# Patient Record
Sex: Female | Born: 1950 | Race: White | Hispanic: No | Marital: Single | Smoking: Never smoker
Health system: Southern US, Community
[De-identification: ages and names within clinical notes are randomized; demographics above are authoritative.]

## PROBLEM LIST (undated history)

## (undated) DIAGNOSIS — Z8719 Personal history of other diseases of the digestive system: Secondary | ICD-10-CM

## (undated) DIAGNOSIS — F419 Anxiety disorder, unspecified: Secondary | ICD-10-CM

## (undated) DIAGNOSIS — I499 Cardiac arrhythmia, unspecified: Secondary | ICD-10-CM

## (undated) DIAGNOSIS — M1990 Unspecified osteoarthritis, unspecified site: Secondary | ICD-10-CM

## (undated) DIAGNOSIS — F32A Depression, unspecified: Secondary | ICD-10-CM

## (undated) DIAGNOSIS — M5090 Cervical disc disorder, unspecified, unspecified cervical region: Secondary | ICD-10-CM

## (undated) DIAGNOSIS — M797 Fibromyalgia: Secondary | ICD-10-CM

## (undated) DIAGNOSIS — F329 Major depressive disorder, single episode, unspecified: Secondary | ICD-10-CM

## (undated) DIAGNOSIS — B1920 Unspecified viral hepatitis C without hepatic coma: Secondary | ICD-10-CM

## (undated) DIAGNOSIS — J45909 Unspecified asthma, uncomplicated: Secondary | ICD-10-CM

## (undated) DIAGNOSIS — K219 Gastro-esophageal reflux disease without esophagitis: Secondary | ICD-10-CM

## (undated) DIAGNOSIS — C801 Malignant (primary) neoplasm, unspecified: Secondary | ICD-10-CM

## (undated) DIAGNOSIS — J449 Chronic obstructive pulmonary disease, unspecified: Secondary | ICD-10-CM

## (undated) DIAGNOSIS — I619 Nontraumatic intracerebral hemorrhage, unspecified: Secondary | ICD-10-CM

## (undated) HISTORY — DX: Anxiety disorder, unspecified: F41.9

## (undated) HISTORY — PX: BREAST REDUCTION SURGERY: SHX8

## (undated) HISTORY — DX: Depression, unspecified: F32.A

## (undated) HISTORY — DX: Unspecified viral hepatitis C without hepatic coma: B19.20

## (undated) HISTORY — PX: OTHER SURGICAL HISTORY: SHX169

## (undated) HISTORY — DX: Cervical disc disorder, unspecified, unspecified cervical region: M50.90

## (undated) HISTORY — DX: Major depressive disorder, single episode, unspecified: F32.9

---

## 2012-02-13 ENCOUNTER — Ambulatory Visit: Payer: Self-pay | Admitting: Anesthesiology

## 2012-02-13 ENCOUNTER — Ambulatory Visit: Payer: Self-pay | Admitting: Otolaryngology

## 2012-02-21 ENCOUNTER — Ambulatory Visit: Payer: Self-pay | Admitting: Otolaryngology

## 2015-08-01 DIAGNOSIS — I10 Essential (primary) hypertension: Secondary | ICD-10-CM | POA: Insufficient documentation

## 2015-08-01 DIAGNOSIS — R002 Palpitations: Secondary | ICD-10-CM | POA: Insufficient documentation

## 2015-08-01 DIAGNOSIS — E782 Mixed hyperlipidemia: Secondary | ICD-10-CM | POA: Insufficient documentation

## 2015-08-01 DIAGNOSIS — R079 Chest pain, unspecified: Secondary | ICD-10-CM | POA: Insufficient documentation

## 2016-01-16 HISTORY — PX: COLONOSCOPY: SHX174

## 2016-05-14 ENCOUNTER — Other Ambulatory Visit: Payer: Self-pay | Admitting: Family Medicine

## 2016-05-14 ENCOUNTER — Ambulatory Visit
Admission: RE | Admit: 2016-05-14 | Discharge: 2016-05-14 | Disposition: A | Discharge status: Home / Self Care | Payer: 59 | Source: Ambulatory Visit | Attending: Family Medicine | Admitting: Family Medicine

## 2016-05-14 DIAGNOSIS — R112 Nausea with vomiting, unspecified: Secondary | ICD-10-CM | POA: Insufficient documentation

## 2016-05-14 DIAGNOSIS — N131 Hydronephrosis with ureteral stricture, not elsewhere classified: Secondary | ICD-10-CM | POA: Diagnosis not present

## 2016-05-14 DIAGNOSIS — N261 Atrophy of kidney (terminal): Secondary | ICD-10-CM | POA: Diagnosis not present

## 2016-05-14 DIAGNOSIS — K769 Liver disease, unspecified: Secondary | ICD-10-CM | POA: Insufficient documentation

## 2016-05-14 DIAGNOSIS — R1900 Intra-abdominal and pelvic swelling, mass and lump, unspecified site: Secondary | ICD-10-CM | POA: Diagnosis not present

## 2016-05-14 DIAGNOSIS — R1031 Right lower quadrant pain: Secondary | ICD-10-CM

## 2016-05-17 ENCOUNTER — Telehealth: Payer: Self-pay

## 2016-05-17 NOTE — Telephone Encounter (Signed)
  Oncology Nurse Navigator Documentation  Navigator Location: [facility] (05/17/16 1500) Navigator Encounter Type: Introductory phone call;Telephone (05/17/16 1500) Telephone: Outgoing Call;Diagnostic Results;Education (05/17/16 1500)             Barriers/Navigation Needs: Coordination of Care (05/17/16 1500)   Interventions: Coordination of Care (05/17/16 1500)            Acuity: Level 2 (05/17/16 1500)   Acuity Level 2: Initial guidance, education and coordination as needed;Educational needs;Assistance expediting appointments;Ongoing guidance and education throughout treatment as needed (05/17/16 1500)     Time Spent with Patient: 60 (05/17/16 1500)   CT scan presented at case conference today for Dr. Clemmie Krill. Spoke with Dr Clemmie Krill following conference with tumor board recommendations as follows: -Urology referral for hydronephrosis due to mass encasing ureter. -PET scan to evaluate another biopsy site and to assess chest. -Medical Oncology referral  Per IR, the mass cannot be accessed for percutaneous biopsy due to blockage from bowel and vessels. Liver lesions are to small to biopsy. If the PET scan does not show another biopsy site then EUS or open biopsy would be next step. At request of Dr. Clemmie Krill called and spoke with Sandra Landry. All of the above explained and she is in agreeance to pursue the recommendations of tumor board. Dr Clemmie Krill will contact her as well and order PET/urology/med onc referrals. Provided Sandra Landry with my contact information for any future questions or needs.

## 2016-05-22 ENCOUNTER — Telehealth: Payer: Self-pay

## 2016-05-22 NOTE — Telephone Encounter (Signed)
  Oncology Nurse Navigator Documentation  Navigator Location: [facility] (05/22/16 1400) Navigator Encounter Type: Telephone (05/22/16 1400) Telephone: Lahoma Crocker Call;Appt Confirmation/Clarification (05/22/16 1400)                                        Time Spent with Patient: 15 (05/22/16 1400)   Voicemail left with Darliss Ridgel for med onc appt. September 8th 10:00 with Dr Mike Gip. Call back number left for questions.

## 2016-05-22 NOTE — Telephone Encounter (Signed)
  Oncology Nurse Navigator Documentation  Navigator Location: [facility] (05/22/16 1600) Navigator Encounter Type: Telephone (05/22/16 1600) Telephone: Incoming Call;Appt Confirmation/Clarification (05/22/16 1600)                                        Time Spent with Patient: 15 (05/22/16 1600)   Notified Ms Sandra Landry of appt 9-8 Friday at 10:00 with Dr. Mike Gip. Directions to cancer center given.

## 2016-05-22 NOTE — Telephone Encounter (Signed)
  Oncology Nurse Navigator Documentation  Navigator Location: [facility] (05/22/16 0900) Navigator Encounter Type: Telephone (05/22/16 0900)                                          Time Spent with Patient: 15 (05/22/16 0900)   Called and spoke with Dr Clemmie Krill. She had made Urology referral. PET requires peer to peer and is in process. Patient was reluctant to oncology referral but has since decided for referral. I will facilitate getting appt scheduled for med onc.

## 2016-05-23 ENCOUNTER — Other Ambulatory Visit: Payer: Self-pay | Admitting: Family Medicine

## 2016-05-23 DIAGNOSIS — R935 Abnormal findings on diagnostic imaging of other abdominal regions, including retroperitoneum: Secondary | ICD-10-CM

## 2016-05-25 ENCOUNTER — Inpatient Hospital Stay: Payer: 59 | Attending: Hematology and Oncology | Admitting: Hematology and Oncology

## 2016-05-25 ENCOUNTER — Encounter (INDEPENDENT_AMBULATORY_CARE_PROVIDER_SITE_OTHER): Payer: Self-pay

## 2016-05-25 ENCOUNTER — Other Ambulatory Visit: Payer: Self-pay | Admitting: *Deleted

## 2016-05-25 ENCOUNTER — Emergency Department: Payer: 59

## 2016-05-25 ENCOUNTER — Encounter: Payer: Self-pay | Admitting: Hematology and Oncology

## 2016-05-25 ENCOUNTER — Emergency Department
Admission: EM | Admit: 2016-05-25 | Discharge: 2016-05-25 | Disposition: A | Discharge status: Home / Self Care | Payer: 59 | Attending: Emergency Medicine | Admitting: Emergency Medicine

## 2016-05-25 ENCOUNTER — Encounter: Payer: Self-pay | Admitting: Emergency Medicine

## 2016-05-25 VITALS — BP 160/118 | HR 132 | Temp 96.2°F | Resp 18 | Ht 66.0 in | Wt 135.8 lb

## 2016-05-25 DIAGNOSIS — K5900 Constipation, unspecified: Secondary | ICD-10-CM | POA: Diagnosis not present

## 2016-05-25 DIAGNOSIS — R634 Abnormal weight loss: Secondary | ICD-10-CM | POA: Insufficient documentation

## 2016-05-25 DIAGNOSIS — R1110 Vomiting, unspecified: Secondary | ICD-10-CM

## 2016-05-25 DIAGNOSIS — M797 Fibromyalgia: Secondary | ICD-10-CM | POA: Diagnosis not present

## 2016-05-25 DIAGNOSIS — F419 Anxiety disorder, unspecified: Secondary | ICD-10-CM | POA: Diagnosis not present

## 2016-05-25 DIAGNOSIS — N131 Hydronephrosis with ureteral stricture, not elsewhere classified: Secondary | ICD-10-CM | POA: Diagnosis not present

## 2016-05-25 DIAGNOSIS — F329 Major depressive disorder, single episode, unspecified: Secondary | ICD-10-CM | POA: Diagnosis not present

## 2016-05-25 DIAGNOSIS — M5030 Other cervical disc degeneration, unspecified cervical region: Secondary | ICD-10-CM | POA: Diagnosis not present

## 2016-05-25 DIAGNOSIS — R0602 Shortness of breath: Secondary | ICD-10-CM | POA: Diagnosis not present

## 2016-05-25 DIAGNOSIS — Z8619 Personal history of other infectious and parasitic diseases: Secondary | ICD-10-CM | POA: Diagnosis not present

## 2016-05-25 DIAGNOSIS — R1900 Intra-abdominal and pelvic swelling, mass and lump, unspecified site: Secondary | ICD-10-CM | POA: Diagnosis present

## 2016-05-25 DIAGNOSIS — R112 Nausea with vomiting, unspecified: Secondary | ICD-10-CM | POA: Insufficient documentation

## 2016-05-25 DIAGNOSIS — R110 Nausea: Secondary | ICD-10-CM | POA: Diagnosis not present

## 2016-05-25 DIAGNOSIS — Z79899 Other long term (current) drug therapy: Secondary | ICD-10-CM | POA: Diagnosis not present

## 2016-05-25 DIAGNOSIS — R000 Tachycardia, unspecified: Secondary | ICD-10-CM | POA: Insufficient documentation

## 2016-05-25 DIAGNOSIS — K769 Liver disease, unspecified: Secondary | ICD-10-CM | POA: Diagnosis not present

## 2016-05-25 DIAGNOSIS — E860 Dehydration: Secondary | ICD-10-CM

## 2016-05-25 DIAGNOSIS — R1031 Right lower quadrant pain: Secondary | ICD-10-CM | POA: Diagnosis not present

## 2016-05-25 HISTORY — DX: Fibromyalgia: M79.7

## 2016-05-25 LAB — COMPREHENSIVE METABOLIC PANEL
ALBUMIN: 3.4 g/dL — AB (ref 3.5–5.0)
ALT: 9 U/L — ABNORMAL LOW (ref 14–54)
AST: 20 U/L (ref 15–41)
Alkaline Phosphatase: 71 U/L (ref 38–126)
Anion gap: 15 (ref 5–15)
BUN: 13 mg/dL (ref 6–20)
CHLORIDE: 100 mmol/L — AB (ref 101–111)
CO2: 18 mmol/L — AB (ref 22–32)
Calcium: 9.7 mg/dL (ref 8.9–10.3)
Creatinine, Ser: 1.01 mg/dL — ABNORMAL HIGH (ref 0.44–1.00)
GFR calc Af Amer: >60 mL/min (ref >60)
GFR, EST NON AFRICAN AMERICAN: 58 mL/min — AB (ref >60)
GLUCOSE: 118 mg/dL — AB (ref 65–99)
POTASSIUM: 3 mmol/L — AB (ref 3.5–5.1)
SODIUM: 133 mmol/L — AB (ref 135–145)
Total Bilirubin: 0.6 mg/dL (ref 0.3–1.2)
Total Protein: 7.4 g/dL (ref 6.5–8.1)

## 2016-05-25 LAB — CBC WITH DIFFERENTIAL/PLATELET
BASOS ABS: 0 10*3/uL (ref 0–0.1)
Basophils Relative: 0 %
Eosinophils Absolute: 0 10*3/uL (ref 0–0.7)
Eosinophils Relative: 0 %
HEMATOCRIT: 36 % (ref 35.0–47.0)
Hemoglobin: 12.1 g/dL (ref 12.0–16.0)
LYMPHS PCT: 10 %
Lymphs Abs: 1.6 10*3/uL (ref 1.0–3.6)
MCH: 26.6 pg (ref 26.0–34.0)
MCHC: 33.6 g/dL (ref 32.0–36.0)
MCV: 79.1 fL — AB (ref 80.0–100.0)
MONO ABS: 0.6 10*3/uL (ref 0.2–0.9)
Monocytes Relative: 4 %
NEUTROS ABS: 14 10*3/uL — AB (ref 1.4–6.5)
Neutrophils Relative %: 86 %
Platelets: 296 10*3/uL (ref 150–440)
RBC: 4.55 MIL/uL (ref 3.80–5.20)
RDW: 15 % — AB (ref 11.5–14.5)
WBC: 16.3 10*3/uL — ABNORMAL HIGH (ref 3.6–11.0)

## 2016-05-25 LAB — URINALYSIS COMPLETE WITH MICROSCOPIC (ARMC ONLY)
Bacteria, UA: NONE SEEN
Bilirubin Urine: NEGATIVE
Glucose, UA: NEGATIVE mg/dL
Leukocytes, UA: NEGATIVE
NITRITE: NEGATIVE
PH: 6 (ref 5.0–8.0)
PROTEIN: 30 mg/dL — AB
SPECIFIC GRAVITY, URINE: 1.013 (ref 1.005–1.030)

## 2016-05-25 LAB — MAGNESIUM: Magnesium: 1.9 mg/dL (ref 1.7–2.4)

## 2016-05-25 LAB — TSH: TSH: 1.374 u[IU]/mL (ref 0.350–4.500)

## 2016-05-25 LAB — LIPASE, BLOOD: LIPASE: 13 U/L (ref 11–51)

## 2016-05-25 MED ORDER — PROCHLORPERAZINE EDISYLATE 5 MG/ML IJ SOLN
10.0000 mg | Freq: Once | INTRAMUSCULAR | Status: AC
  Administered 2016-05-25: 10 mg via INTRAVENOUS
  Filled 2016-05-25: qty 2

## 2016-05-25 MED ORDER — POTASSIUM CHLORIDE CRYS ER 20 MEQ PO TBCR: 40.0000 meq | EXTENDED_RELEASE_TABLET | Freq: Once | ORAL | Status: DC

## 2016-05-25 MED ORDER — SODIUM CHLORIDE 0.9 % IV BOLUS (SEPSIS)
1000.0000 mL | Freq: Once | INTRAVENOUS | Status: AC
  Administered 2016-05-25: 1000 mL via INTRAVENOUS

## 2016-05-25 MED ORDER — IPRATROPIUM-ALBUTEROL 0.5-2.5 (3) MG/3ML IN SOLN: 3.0000 mL | Freq: Once | RESPIRATORY_TRACT | Status: DC

## 2016-05-25 NOTE — ED Triage Notes (Signed)
Developed n/v for the past 2-3 days  Was sent in from [facility] for possible dehydration

## 2016-05-25 NOTE — ED Notes (Signed)
Patient transported to X-ray 

## 2016-05-25 NOTE — Progress Notes (Signed)
[facility] day:  05/25/2016  Chief Complaint: Sandra Landry is a 65 y.o. female with a retroperitoneal mass who is referred in consultation by Dr. Clemmie Krill for assessment and management.  HPI: The patient is not been feeling well for a period of time.  She notes lower abdomen for 2 years which she has related to irritable bowel syndrome. For the past 2 months she is had RLQ pain which has extended to her right buttock and hip.  The patient presented to Dr. Lavera Guise on 05/14/2016 with severe acute abdominal pain.  Pain localized to the right lower quadrant concerning for appendicitis.  Abdominal and pelvic CT scan on 05/14/2016 revealed a 5.7 cm right lower quadrant retroperitoneal mass highly concerning for malignancy. The mass involves the sigmoid colon but is favored to reflect a primary retroperitoneal tumor (such as sarcoma) over a primary colonic mass.  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  There were multiple subcentimeter liver lesions, indeterminate.   Patient was presented at tumor board on 05/17/2016.  Recommendations were for urology referral for hydronephrosis due to the mass encasing the ureter.  PET scan was suggested to evaluate another site for biopsy.  Per interventional radiology, the mass cannot be accessed via percutaneous biopsy due to overlying bowel and intervening vasculature.  Liver lesions are too small to biopsy.  If the PET scan does not show another biopsy site then EUS or laparoscopic biopsy would be the next step.   She had a colonoscopy by Dr. Jacquiline Doe at [facility] on 07/18/2015.  She had 3 adenomatous polyps.  Symptomatically, she notes weight loss of 35 pounds in the past year.  She has been short of breath for 2 days.  She has had decreased urine output secondary to decreased oral intake.  She has "vomited a lot for years".  She is constipated and uses Miralax and  stool softeners.  Abdominal pain is not controlled.  She is scheduled for PET scan on 05/30/2016.  She has a urology appointment on 06/04/2016.   Past Medical History:  Diagnosis Date  . Anxiety   . Cervical disc disease   . Depression   . Fibromyalgia   . Hepatitis C     Past Surgical History:  Procedure Laterality Date  . cervical discectomy with fusion    . COLONOSCOPY  01/2016    No family history on file.  Social History:  reports that she has never smoked. She has never used smokeless tobacco. Her alcohol and drug histories are not on file.  She lives in [place].  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  She has also worked in the Physicist, medical.  The patient is accompanied by her partner, Darliss Ridgel, a nurse, today.  Allergies:  Allergies  Allergen Reactions  . Other Swelling    Dust, ragweed  . Iodides   . Tetanus Toxoids Other (See Comments)    Localized pain and swelling  . Iodine Other (See Comments) and Rash    Limited history. Occurred in 61s, at age 54-12, received contrast at public health facility in [place], while being evaluated for meningitis. Immediate rash - not itchy, "flat, red, perfect circles" over entire body. Has not received iodine contrast since.    Current Medications: Current Outpatient Prescriptions  Medication Sig Dispense Refill  . cetirizine (ZYRTEC) 10 MG tablet Take by mouth.    . ranitidine (ZANTAC) 300 MG  tablet Take 300 mg by mouth.    . baclofen (LIORESAL) 20 MG tablet     . diltiazem (CARDIZEM CD) 120 MG 24 hr capsule Take 120 mg by mouth.    . escitalopram (LEXAPRO) 20 MG tablet     . polyethylene glycol (MIRALAX / GLYCOLAX) packet     . promethazine (PHENERGAN) 25 MG suppository     . ranitidine (ZANTAC) 300 MG tablet     . traMADol (ULTRAM) 50 MG tablet Take by mouth.     No current facility-administered medications for this visit.     Review of Systems:  GENERAL:  Feels "bad".  No fevers or sweats.  Weight loss of  35 pounds in year. PERFORMANCE STATUS (ECOG):  2 HEENT:  No visual changes, runny nose, sore throat, mouth sores or tenderness. Lungs:  Shortness of breath x 2 days.  No cough.  No hemoptysis. Cardiac:  No chest pain, palpitations, orthopnea, or PND. GI:  Pain in RLQ which radiates to buttocks and hip.  No appetite.  Chronic nausea and vomiting.  Irritable bowel.  Constipation.  No diarrhea, melena or hematochezia. GU:  Decreased urine output.  No urgency, frequency, dysuria, or hematuria. Musculoskeletal:  No back pain.  No joint pain.  No muscle tenderness. Extremities:  No pain or swelling. Skin:  No rashes or skin changes. Neuro:  No headache, numbness or weakness, balance or coordination issues. Endocrine:  No diabetes, thyroid issues, hot flashes or night sweats. Psych:  Hypersensitive.  PTSD.  Depression.  Anxiety. Pain:  RLQ pain. Review of systems:  All other systems reviewed and found to be negative.  Physical Exam: Blood pressure (!) 160/118, pulse (!) 132, temperature (!) 96.2 F (35.7 C), resp. rate 18, height 5' 6"  (1.676 m), weight 135 lb 12.9 oz (61.6 kg). GENERAL:  Well developed, well nourished, woman lying on the exam room in mild/moderate distress. MENTAL STATUS:  Alert and oriented to person, place and time. HEAD:  Short dark hair with graying.  Normocephalic, atraumatic, face symmetric, no Cushingoid features. EYES:  Pupils equal round and reactive to light and accomodation.  No conjunctivitis or scleral icterus. ENT:  Oropharynx clear without lesion.  No upper teeth.  Tongue normal. Mucous membranes moist.  RESPIRATORY:  Clear to auscultation without rales, wheezes or rhonchi. CARDIOVASCULAR:  Regular rate and rhythm without murmur, rub or gallop. ABDOMEN:  Soft, tender RLQ without guarding or rebound tenderness.  Mass like fullness in RLQ.  Active bowel sounds and no hepatosplenomegaly.   SKIN:  No rashes, ulcers or lesions. EXTREMITIES: No edema, no skin  discoloration or tenderness.  No palpable cords. LYMPH NODES: No palpable cervical, supraclavicular, axillary or inguinal adenopathy  NEUROLOGICAL: Unremarkable. PSYCH:  Appropriate.   Admission on 05/25/2016, Discharged on 05/25/2016  Component Date Value Ref Range Status  . Sodium 05/25/2016 133* 135 - 145 mmol/L Final  . Potassium 05/25/2016 3.0* 3.5 - 5.1 mmol/L Final  . Chloride 05/25/2016 100* 101 - 111 mmol/L Final  . CO2 05/25/2016 18* 22 - 32 mmol/L Final  . Glucose, Bld 05/25/2016 118* 65 - 99 mg/dL Final  . BUN 05/25/2016 13  6 - 20 mg/dL Final  . Creatinine, Ser 05/25/2016 1.01* 0.44 - 1.00 mg/dL Final  . Calcium 05/25/2016 9.7  8.9 - 10.3 mg/dL Final  . Total Protein 05/25/2016 7.4  6.5 - 8.1 g/dL Final  . Albumin 05/25/2016 3.4* 3.5 - 5.0 g/dL Final  . AST 05/25/2016 20  15 - 41 U/L Final  .  ALT 05/25/2016 9* 14 - 54 U/L Final  . Alkaline Phosphatase 05/25/2016 71  38 - 126 U/L Final  . Total Bilirubin 05/25/2016 0.6  0.3 - 1.2 mg/dL Final  . GFR calc non Af Amer 05/25/2016 58* >60 mL/min Final  . GFR calc Af Amer 05/25/2016 >60  >60 mL/min Final   Comment: (NOTE) The eGFR has been calculated using the CKD EPI equation. This calculation has not been validated in all clinical situations. eGFR's persistently <60 mL/min signify possible Chronic Kidney Disease.   . Anion gap 05/25/2016 15  5 - 15 Final  . Lipase 05/25/2016 13  11 - 51 U/L Final  . WBC 05/25/2016 16.3* 3.6 - 11.0 K/uL Final  . RBC 05/25/2016 4.55  3.80 - 5.20 MIL/uL Final  . Hemoglobin 05/25/2016 12.1  12.0 - 16.0 g/dL Final  . HCT 05/25/2016 36.0  35.0 - 47.0 % Final  . MCV 05/25/2016 79.1* 80.0 - 100.0 fL Final  . MCH 05/25/2016 26.6  26.0 - 34.0 pg Final  . MCHC 05/25/2016 33.6  32.0 - 36.0 g/dL Final  . RDW 05/25/2016 15.0* 11.5 - 14.5 % Final  . Platelets 05/25/2016 296  150 - 440 K/uL Final  . Neutrophils Relative % 05/25/2016 86  % Final  . Neutro Abs 05/25/2016 14.0* 1.4 - 6.5 K/uL Final  .  Lymphocytes Relative 05/25/2016 10  % Final  . Lymphs Abs 05/25/2016 1.6  1.0 - 3.6 K/uL Final  . Monocytes Relative 05/25/2016 4  % Final  . Monocytes Absolute 05/25/2016 0.6  0.2 - 0.9 K/uL Final  . Eosinophils Relative 05/25/2016 0  % Final  . Eosinophils Absolute 05/25/2016 0.0  0 - 0.7 K/uL Final  . Basophils Relative 05/25/2016 0  % Final  . Basophils Absolute 05/25/2016 0.0  0 - 0.1 K/uL Final  . Magnesium 05/25/2016 1.9  1.7 - 2.4 mg/dL Final  . TSH 05/25/2016 1.374  0.350 - 4.500 uIU/mL Final  . Color, Urine 05/25/2016 YELLOW* YELLOW Final  . APPearance 05/25/2016 CLEAR* CLEAR Final  . Glucose, UA 05/25/2016 NEGATIVE  NEGATIVE mg/dL Final  . Bilirubin Urine 05/25/2016 NEGATIVE  NEGATIVE Final  . Ketones, ur 05/25/2016 2+* NEGATIVE mg/dL Final  . Specific Gravity, Urine 05/25/2016 1.013  1.005 - 1.030 Final  . Hgb urine dipstick 05/25/2016 1+* NEGATIVE Final  . pH 05/25/2016 6.0  5.0 - 8.0 Final  . Protein, ur 05/25/2016 30* NEGATIVE mg/dL Final  . Nitrite 05/25/2016 NEGATIVE  NEGATIVE Final  . Leukocytes, UA 05/25/2016 NEGATIVE  NEGATIVE Final  . RBC / HPF 05/25/2016 0-5  0 - 5 RBC/hpf Final  . WBC, UA 05/25/2016 0-5  0 - 5 WBC/hpf Final  . Bacteria, UA 05/25/2016 NONE SEEN  NONE SEEN Final  . Squamous Epithelial / LPF 05/25/2016 0-5* NONE SEEN Final  . Hyaline Casts, UA 05/25/2016 PRESENT   Final    Assessment:  Teretha Chalupa is a 65 y.o. 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  She has a history of lower abdomen for 2 years felt secondary to irritable bowel syndrome. For the past 2 months she is had RLQ pain which has extended to her right buttock and hip.  She has lost 35 pounds in the past year.  Abdominal and pelvic CT scan on 05/14/2016 revealed a 5.7 cm right lower quadrant retroperitoneal mass highly concerning for malignancy. The mass involves the sigmoid colon but is favored to reflect a primary retroperitoneal tumor  (  such as sarcoma) over a primary colonic mass.  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  There were multiple subcentimeter liver lesions, indeterminate.   Colonoscopy at [facility] on 07/18/2015 revealed 3 adenomatous polyps.  Symptomatically, has significant RLQ pain and decreased urine output secondary to decreased oral intake.  She is in mild/moderate distress.  Plan: 1.  Review imaging with patient.  Discuss differential diagnosis (sarcoma, GIST,  lymphoma, germ cell, desmoid).  Suspect sarcoma.  Discuss presentation at tumor board.  Discuss need for biopsy.  Unable to obtain biopsy via CT secondary to vasculature and bowels. Discuss plan for outpatient PET scan on 05/30/2016 to assess for other potential biopsy site.  If no other site of disease, discuss laparoscopic biopsy or endoscopic biopsy with ultrasound through the colon. 2.  Discuss current symptomatology with the poor oral intake, dehydration and inadequate pain control. Discuss referral to the ER.  Her partner feels that she needs to be admitted to the hospital.   3.  Transfer to the ER. 4.  PET scan on 05/30/2016.  5.  RTC in 1 week for review of PET scan and plan for biopsy.   Lequita Asal, MD  05/25/2016

## 2016-05-25 NOTE — Progress Notes (Signed)
Patient here as new evaluation regarding retroperitoneal mass.  Referred by Dr. Clemmie Krill.

## 2016-05-25 NOTE — ED Provider Notes (Addendum)
[facility] Emergency Department Provider Note  ____________________________________________   I have reviewed the triage vital signs and the nursing notes.   HISTORY  Chief Complaint Emesis    HPI Sandra Landry is a 65 y.o. female with a history of a unknown mass, that is in the retroperitoneal area. According to Dr. Susy Manor who called [place], she has a history of recurrent episodes of vomiting and a 30 pound weight loss this year presents today with vomiting for 2 days and the feeling of being dehydrated. She does not complain of abdominal pain. She is having bowel movements although they are "small". She is not had any hematemesis or melena or bright red blood per rectum. She was sent in here for IV hydration. Patient and family much for to go home if possible because there is a pending PET scan as an outpatient that would like to get to.    Past Medical History:  Diagnosis Date  . Anxiety   . Cervical disc disease   . Depression   . Fibromyalgia   . Hepatitis C     There are no active problems to display for this patient.   Past Surgical History:  Procedure Laterality Date  . cervical discectomy with fusion    . COLONOSCOPY  01/2016    Prior to Admission medications   Medication Sig Start Date End Date Taking? Authorizing Provider  baclofen (LIORESAL) 20 MG tablet  04/09/16   Historical Provider, MD  cetirizine (ZYRTEC) 10 MG tablet Take by mouth. 06/25/15   Historical Provider, MD  diltiazem (CARDIZEM CD) 120 MG 24 hr capsule Take 120 mg by mouth.    Historical Provider, MD  escitalopram (LEXAPRO) 20 MG tablet  04/26/16   Historical Provider, MD  polyethylene glycol (MIRALAX / GLYCOLAX) packet  05/07/16   Historical Provider, MD  promethazine (PHENERGAN) 25 MG suppository  02/20/16   Historical Provider, MD  ranitidine (ZANTAC) 300 MG tablet Take 300 mg by mouth. 03/08/15   Historical Provider, MD  ranitidine (ZANTAC) 300 MG tablet  05/17/16    Historical Provider, MD  traMADol (ULTRAM) 50 MG tablet Take by mouth.    Historical Provider, MD    Allergies Other; Iodides; Tetanus toxoids; and Iodine  No family history on file.  Social History Social History  Substance Use Topics  . Smoking status: Never Smoker  . Smokeless tobacco: Never Used  . Alcohol use Not on file    Review of Systems Constitutional: No fever/chills Eyes: No visual changes. ENT: No sore throat. No stiff neck no neck pain Cardiovascular: Denies chest pain. Respiratory: Denies shortness of breath. Gastrointestinal:   Positive vomiting.  No diarrhea.  No constipation. Genitourinary: Negative for dysuria. Musculoskeletal: Negative lower extremity swelling Skin: Negative for rash. Neurological: Negative for severe headaches, focal weakness or numbness. 10-point ROS otherwise negative.  ____________________________________________   PHYSICAL EXAM:  VITAL SIGNS: ED Triage Vitals  Enc Vitals Group     BP 05/25/16 1202 (!) 147/100     Pulse Rate 05/25/16 1202 (!) 130     Resp 05/25/16 1202 20     Temp 05/25/16 1202 98.1 F (36.7 C)     Temp Source 05/25/16 1202 Oral     SpO2 05/25/16 1202 98 %     Weight 05/25/16 1159 135 lb (61.2 kg)     Height 05/25/16 1159 5\' 6"  (1.676 m)     Head Circumference --      Peak Flow --  Pain Score --      Pain Loc --      Pain Edu? --      Excl. in [place]? --     Constitutional: Alert and oriented. Well appearing and in no acute distress. Eyes: Conjunctivae are normal. PERRL. EOMI. Head: Atraumatic. Nose: No congestion/rhinnorhea. Mouth/Throat: Mucous membranes are Dry.  Oropharynx non-erythematous. Neck: No stridor.   Nontender with no meningismus Cardiovascular: Tachycardia noted, regular rhythm. Grossly normal heart sounds.  Good peripheral circulation. Respiratory: Normal respiratory effort.  No retractions. Lungs CTAB. Abdominal: Soft and nontender. No distention. No guarding no rebound Back:   There is no focal tenderness or step off.  there is no midline tenderness there are no lesions noted. there is no CVA tenderness Musculoskeletal: No lower extremity tenderness, no upper extremity tenderness. No joint effusions, no DVT signs strong distal pulses no edema Neurologic:  Normal speech and language. No gross focal neurologic deficits are appreciated.  Skin:  Skin is warm, dry and intact. No rash noted. Psychiatric: Mood and affect are normal. Speech and behavior are normal.  ____________________________________________   LABS (all labs ordered are listed, but only abnormal results are displayed)  Labs Reviewed  COMPREHENSIVE METABOLIC PANEL  LIPASE, BLOOD  CBC WITH DIFFERENTIAL/PLATELET  MAGNESIUM  TSH   ____________________________________________  EKG  I personally interpreted any EKGs ordered by me or triage Sinus tachycardia rate 115 bpm no acute ST elevation or acute ST depression [place] LAD ____________________________________________  RADIOLOGY  I reviewed any imaging ordered by me or triage that were performed during my shift and, if possible, patient and/or family made aware of any abnormal findings. ____________________________________________   PROCEDURES  Procedure(s) performed: None  Procedures  Critical Care performed: None  ____________________________________________   INITIAL IMPRESSION / ASSESSMENT AND PLAN / ED COURSE  Pertinent labs & imaging results that were available during my care of the patient were reviewed by me and considered in my medical decision making (see chart for details).  She is nontoxic appearing, she is here for nausea and vomiting which is recurrent and likely secondary to her baseline and oncologic process. This is being worked up as an outpatient. She does not have an acute abdomen. He we'll give her IV fluid L Taylor insertion series as a precaution although I don't think CT is warranted at this moment as she looks,  we'll check basic blood work including electrolytes we will give her IV fluid and we will reassess her ability to go home as she desires to   ----------------------------------------- 2:34 PM on 05/25/2016 -----------------------------------------  Heart rate now down into the low 100s 102 last checked, patient feeling much better after 1 L giving a second liter abdomen remains benign workup is reassuring.  ----------------------------------------- 3:44 PM on 05/25/2016 -----------------------------------------  Her rate still goes up into the 110s, I have strongly advised the patient, as she refuses admission, receive another liter of IV fluid which she agrees to. Patient very much understands that at this time my advice to be admission for continued hydration and evaluation but she declines. She is adamant that she would like to go home. She does understand the risks benefits and alternatives of admission as well as discharge and she is well-appearing side from her tachycardia. Signed out to dr. Reita Cliche at the end of my shift. UA pending.  Clinical Course   ____________________________________________   FINAL CLINICAL IMPRESSION(S) / ED DIAGNOSES  Final diagnoses:  Vomiting      This chart was dictated using  voice recognition software.  Despite best efforts to proofread,  errors can occur which can change meaning.      Schuyler Amor, MD 05/25/16 [place], MD 05/25/16 1434    Schuyler Amor, MD 05/25/16 [place], MD 05/25/16 [place], MD 05/25/16 [phone]

## 2016-05-25 NOTE — ED Provider Notes (Signed)
Sandra Landry  I accepted care from Dr. Burlene Arnt ____________________________________________    LABS (pertinent positives/negatives)  Labs Reviewed  COMPREHENSIVE METABOLIC PANEL - Abnormal; Notable for the following:       Result Value   Sodium 133 (*)    Potassium 3.0 (*)    Chloride 100 (*)    CO2 18 (*)    Glucose, Bld 118 (*)    Creatinine, Ser 1.01 (*)    Albumin 3.4 (*)    ALT 9 (*)    GFR calc non Af Amer 58 (*)    All other components within normal limits  CBC WITH DIFFERENTIAL/PLATELET - Abnormal; Notable for the following:    WBC 16.3 (*)    MCV 79.1 (*)    RDW 15.0 (*)    Neutro Abs 14.0 (*)    All other components within normal limits  URINALYSIS COMPLETEWITH MICROSCOPIC ([facility] ONLY) - Abnormal; Notable for the following:    Color, Urine YELLOW (*)    APPearance CLEAR (*)    Ketones, ur 2+ (*)    Hgb urine dipstick 1+ (*)    Protein, ur 30 (*)    Squamous Epithelial / LPF 0-5 (*)    All other components within normal limits  LIPASE, BLOOD  MAGNESIUM  TSH     ____________________________________________   PROCEDURES  Procedure(s) performed: None  Critical Care performed: None  ____________________________________________   INITIAL IMPRESSION / ASSESSMENT AND PLAN / ED COURSE   Pertinent labs & imaging results that were available during my care of the patient were reviewed by me and considered in my medical decision making (see chart for details).  Sandra Landry would really like to go home.  We've discussed her elevated heart rate and that I think it would be best to be observed in the hospital.    She is currently receiving one more liter of fluid.  She did have a  Reassuring ct scan of the abdomen.  Urinalysis was without uti.  Will recheck with patient and partner after this liter of NS.  Heart rate down to 100. Patient feels better and states that she really wants to go home. It is okay for discharge. I did discuss with  her strict return precautions.  CONSULTATIONS: None    Patient / Family / Caregiver informed of clinical course, medical decision-making process, and agree with plan.   I discussed return precautions, follow-up instructions, and discharged instructions with patient and/or family.     ____________________________________________   FINAL CLINICAL IMPRESSION(S) / ED DIAGNOSES  Final diagnoses:  Vomiting  Non-intractable vomiting with nausea, vomiting of unspecified type  Dehydration        Sandra Roca, MD 05/25/16 [phone]

## 2016-05-25 NOTE — ED Notes (Signed)
Pt in via triage with complaints of N/V x 3 days, unable to keep anything down PO.  Pt was advised to come to [place] from the cancer center due to dehydration.  Pt denies any pain at this time.  Pt A/Ox4, tachycardic, other vitals WDL, no immediate distress noted at this time.

## 2016-05-25 NOTE — Discharge Instructions (Signed)
Return to the emergency department for any worsening condition including fever, chest or abdominal pain, vomiting with concern for dehydration, not making urine, dizziness or passing out, weakness or numbness, confusion or altered mental status or any other symptoms concerning to you.

## 2016-05-27 ENCOUNTER — Encounter: Payer: Self-pay | Admitting: Hematology and Oncology

## 2016-05-27 DIAGNOSIS — R634 Abnormal weight loss: Secondary | ICD-10-CM | POA: Insufficient documentation

## 2016-05-30 ENCOUNTER — Ambulatory Visit: Admission: RE | Admit: 2016-05-30 | Payer: 59 | Source: Ambulatory Visit

## 2016-05-30 ENCOUNTER — Observation Stay
Admission: EM | Admit: 2016-05-30 | Discharge: 2016-05-31 | Disposition: A | Discharge status: Home / Self Care | Payer: 59 | Attending: Internal Medicine | Admitting: Internal Medicine

## 2016-05-30 ENCOUNTER — Encounter: Payer: Self-pay | Admitting: Emergency Medicine

## 2016-05-30 DIAGNOSIS — B1920 Unspecified viral hepatitis C without hepatic coma: Secondary | ICD-10-CM | POA: Insufficient documentation

## 2016-05-30 DIAGNOSIS — Z6821 Body mass index (BMI) 21.0-21.9, adult: Secondary | ICD-10-CM | POA: Diagnosis not present

## 2016-05-30 DIAGNOSIS — Z9109 Other allergy status, other than to drugs and biological substances: Secondary | ICD-10-CM | POA: Diagnosis not present

## 2016-05-30 DIAGNOSIS — N179 Acute kidney failure, unspecified: Secondary | ICD-10-CM | POA: Insufficient documentation

## 2016-05-30 DIAGNOSIS — I959 Hypotension, unspecified: Secondary | ICD-10-CM | POA: Diagnosis not present

## 2016-05-30 DIAGNOSIS — E876 Hypokalemia: Secondary | ICD-10-CM | POA: Insufficient documentation

## 2016-05-30 DIAGNOSIS — I4581 Long QT syndrome: Secondary | ICD-10-CM | POA: Insufficient documentation

## 2016-05-30 DIAGNOSIS — R000 Tachycardia, unspecified: Secondary | ICD-10-CM | POA: Diagnosis not present

## 2016-05-30 DIAGNOSIS — R531 Weakness: Secondary | ICD-10-CM | POA: Diagnosis present

## 2016-05-30 DIAGNOSIS — Z887 Allergy status to serum and vaccine status: Secondary | ICD-10-CM | POA: Insufficient documentation

## 2016-05-30 DIAGNOSIS — F419 Anxiety disorder, unspecified: Secondary | ICD-10-CM | POA: Diagnosis not present

## 2016-05-30 DIAGNOSIS — R1110 Vomiting, unspecified: Secondary | ICD-10-CM | POA: Insufficient documentation

## 2016-05-30 DIAGNOSIS — R55 Syncope and collapse: Secondary | ICD-10-CM | POA: Diagnosis present

## 2016-05-30 DIAGNOSIS — Z888 Allergy status to other drugs, medicaments and biological substances status: Secondary | ICD-10-CM | POA: Insufficient documentation

## 2016-05-30 DIAGNOSIS — F329 Major depressive disorder, single episode, unspecified: Secondary | ICD-10-CM | POA: Insufficient documentation

## 2016-05-30 DIAGNOSIS — M797 Fibromyalgia: Secondary | ICD-10-CM | POA: Diagnosis not present

## 2016-05-30 DIAGNOSIS — R1900 Intra-abdominal and pelvic swelling, mass and lump, unspecified site: Secondary | ICD-10-CM | POA: Diagnosis not present

## 2016-05-30 DIAGNOSIS — D72829 Elevated white blood cell count, unspecified: Secondary | ICD-10-CM | POA: Diagnosis not present

## 2016-05-30 DIAGNOSIS — K5900 Constipation, unspecified: Secondary | ICD-10-CM | POA: Diagnosis not present

## 2016-05-30 DIAGNOSIS — Z981 Arthrodesis status: Secondary | ICD-10-CM | POA: Insufficient documentation

## 2016-05-30 DIAGNOSIS — Z91041 Radiographic dye allergy status: Secondary | ICD-10-CM | POA: Diagnosis not present

## 2016-05-30 DIAGNOSIS — Z79899 Other long term (current) drug therapy: Secondary | ICD-10-CM | POA: Diagnosis not present

## 2016-05-30 DIAGNOSIS — R634 Abnormal weight loss: Secondary | ICD-10-CM | POA: Insufficient documentation

## 2016-05-30 DIAGNOSIS — E860 Dehydration: Principal | ICD-10-CM | POA: Insufficient documentation

## 2016-05-30 LAB — COMPREHENSIVE METABOLIC PANEL
ALBUMIN: 3.4 g/dL — AB (ref 3.5–5.0)
ALT: 9 U/L — ABNORMAL LOW (ref 14–54)
AST: 20 U/L (ref 15–41)
Alkaline Phosphatase: 72 U/L (ref 38–126)
Anion gap: 13 (ref 5–15)
BUN: 15 mg/dL (ref 6–20)
CHLORIDE: 101 mmol/L (ref 101–111)
CO2: 20 mmol/L — ABNORMAL LOW (ref 22–32)
Calcium: 9.5 mg/dL (ref 8.9–10.3)
Creatinine, Ser: 1.42 mg/dL — ABNORMAL HIGH (ref 0.44–1.00)
GFR calc Af Amer: 45 mL/min — ABNORMAL LOW (ref >60)
GFR, EST NON AFRICAN AMERICAN: 38 mL/min — AB (ref >60)
Glucose, Bld: 207 mg/dL — ABNORMAL HIGH (ref 65–99)
POTASSIUM: 2.5 mmol/L — AB (ref 3.5–5.1)
Sodium: 134 mmol/L — ABNORMAL LOW (ref 135–145)
Total Bilirubin: 0.5 mg/dL (ref 0.3–1.2)
Total Protein: 7.1 g/dL (ref 6.5–8.1)

## 2016-05-30 LAB — MAGNESIUM: Magnesium: 2 mg/dL (ref 1.7–2.4)

## 2016-05-30 LAB — URINALYSIS COMPLETE WITH MICROSCOPIC (ARMC ONLY)
BILIRUBIN URINE: NEGATIVE
Bacteria, UA: NONE SEEN
Glucose, UA: NEGATIVE mg/dL
HGB URINE DIPSTICK: NEGATIVE
LEUKOCYTES UA: NEGATIVE
Nitrite: NEGATIVE
PH: 6 (ref 5.0–8.0)
Protein, ur: 30 mg/dL — AB
Specific Gravity, Urine: 1.013 (ref 1.005–1.030)

## 2016-05-30 LAB — POTASSIUM: Potassium: 3.5 mmol/L (ref 3.5–5.1)

## 2016-05-30 LAB — CBC
HCT: 35.8 % (ref 35.0–47.0)
Hemoglobin: 11.9 g/dL — ABNORMAL LOW (ref 12.0–16.0)
MCH: 26.3 pg (ref 26.0–34.0)
MCHC: 33.3 g/dL (ref 32.0–36.0)
MCV: 79 fL — AB (ref 80.0–100.0)
PLATELETS: 353 10*3/uL (ref 150–440)
RBC: 4.54 MIL/uL (ref 3.80–5.20)
RDW: 14.9 % — AB (ref 11.5–14.5)
WBC: 20.8 10*3/uL — AB (ref 3.6–11.0)

## 2016-05-30 LAB — TROPONIN I

## 2016-05-30 MED ORDER — ALBUTEROL SULFATE (2.5 MG/3ML) 0.083% IN NEBU: 2.5000 mg | INHALATION_SOLUTION | RESPIRATORY_TRACT | Status: DC | PRN

## 2016-05-30 MED ORDER — SODIUM CHLORIDE 0.9% FLUSH
3.0000 mL | Freq: Two times a day (BID) | INTRAVENOUS | Status: DC
  Administered 2016-05-30 – 2016-05-31 (×2): 3 mL via INTRAVENOUS

## 2016-05-30 MED ORDER — ONDANSETRON HCL 4 MG PO TABS
4.0000 mg | ORAL_TABLET | Freq: Four times a day (QID) | ORAL | Status: DC | PRN
Start: 2016-05-30 — End: 2016-05-31

## 2016-05-30 MED ORDER — POTASSIUM CHLORIDE IN NACL 20-0.9 MEQ/L-% IV SOLN
Freq: Once | INTRAVENOUS | Status: AC
  Administered 2016-05-30: 1000 mL via INTRAVENOUS
  Filled 2016-05-30: qty 1000

## 2016-05-30 MED ORDER — POTASSIUM CHLORIDE 20 MEQ/15ML (10%) PO SOLN
40.0000 meq | Freq: Once | ORAL | Status: DC
  Filled 2016-05-30: qty 30

## 2016-05-30 MED ORDER — ACETAMINOPHEN 325 MG PO TABS: 650.0000 mg | ORAL_TABLET | Freq: Four times a day (QID) | ORAL | Status: DC | PRN

## 2016-05-30 MED ORDER — HYDROCODONE-ACETAMINOPHEN 5-325 MG PO TABS
1.0000 | ORAL_TABLET | ORAL | Status: DC | PRN
  Administered 2016-05-30 (×2): 1 via ORAL
  Administered 2016-05-30 – 2016-05-31 (×2): 2 via ORAL
  Filled 2016-05-30: qty 1
  Filled 2016-05-30 (×2): qty 2
  Filled 2016-05-30: qty 1

## 2016-05-30 MED ORDER — POTASSIUM CHLORIDE 20 MEQ PO PACK
PACK | ORAL | Status: AC
  Filled 2016-05-30: qty 2

## 2016-05-30 MED ORDER — ONDANSETRON HCL 4 MG/2ML IJ SOLN: 4.0000 mg | Freq: Four times a day (QID) | INTRAMUSCULAR | Status: DC | PRN

## 2016-05-30 MED ORDER — SODIUM CHLORIDE 0.9 % IV BOLUS (SEPSIS)
1000.0000 mL | Freq: Once | INTRAVENOUS | Status: AC
  Administered 2016-05-30: 1000 mL via INTRAVENOUS

## 2016-05-30 MED ORDER — POTASSIUM CHLORIDE 10 MEQ/100ML IV SOLN
10.0000 meq | INTRAVENOUS | Status: AC
  Administered 2016-05-30 (×6): 10 meq via INTRAVENOUS
  Filled 2016-05-30 (×6): qty 100

## 2016-05-30 MED ORDER — POTASSIUM CHLORIDE IN NACL 20-0.9 MEQ/L-% IV SOLN
INTRAVENOUS | Status: DC
  Administered 2016-05-30: 1000 mL via INTRAVENOUS
  Administered 2016-05-31 (×2): via INTRAVENOUS
  Filled 2016-05-30 (×5): qty 1000

## 2016-05-30 MED ORDER — POTASSIUM CHLORIDE CRYS ER 20 MEQ PO TBCR
40.0000 meq | EXTENDED_RELEASE_TABLET | Freq: Once | ORAL | Status: DC
  Filled 2016-05-30: qty 2

## 2016-05-30 MED ORDER — POLYETHYLENE GLYCOL 3350 17 G PO PACK: 17.0000 g | PACK | Freq: Every day | ORAL | Status: DC | PRN

## 2016-05-30 MED ORDER — BISACODYL 10 MG RE SUPP: 10.0000 mg | Freq: Every day | RECTAL | Status: DC | PRN

## 2016-05-30 MED ORDER — ONDANSETRON HCL 4 MG/2ML IJ SOLN
4.0000 mg | Freq: Once | INTRAMUSCULAR | Status: AC
Start: 2016-05-30 — End: 2016-05-30
  Administered 2016-05-30: 4 mg via INTRAVENOUS
  Filled 2016-05-30: qty 2

## 2016-05-30 MED ORDER — SODIUM CHLORIDE 0.9 % IV SOLN: 250.0000 mL | INTRAVENOUS | Status: DC | PRN

## 2016-05-30 MED ORDER — SODIUM CHLORIDE 0.9% FLUSH: 3.0000 mL | INTRAVENOUS | Status: DC | PRN

## 2016-05-30 MED ORDER — LORATADINE 10 MG PO TABS
10.0000 mg | ORAL_TABLET | Freq: Every day | ORAL | Status: DC
  Administered 2016-05-30: 10 mg via ORAL
  Filled 2016-05-30: qty 1

## 2016-05-30 MED ORDER — ESCITALOPRAM OXALATE 10 MG PO TABS
20.0000 mg | ORAL_TABLET | Freq: Every day | ORAL | Status: DC
  Administered 2016-05-30: 17:00:00 20 mg via ORAL
  Filled 2016-05-30: qty 2

## 2016-05-30 MED ORDER — ACETAMINOPHEN 650 MG RE SUPP: 650.0000 mg | Freq: Four times a day (QID) | RECTAL | Status: DC | PRN

## 2016-05-30 MED ORDER — BACLOFEN 10 MG PO TABS
20.0000 mg | ORAL_TABLET | Freq: Four times a day (QID) | ORAL | Status: DC | PRN
Start: 2016-05-30 — End: 2016-05-31
  Administered 2016-05-30 – 2016-05-31 (×2): 20 mg via ORAL
  Filled 2016-05-30 (×2): qty 2

## 2016-05-30 MED ORDER — ENOXAPARIN SODIUM 40 MG/0.4ML ~~LOC~~ SOLN
40.0000 mg | SUBCUTANEOUS | Status: DC
  Administered 2016-05-30: 40 mg via SUBCUTANEOUS
  Filled 2016-05-30: qty 0.4

## 2016-05-30 NOTE — ED Provider Notes (Signed)
[facility] Emergency Department Provider Note  Time seen: 8:33 AM  I have reviewed the triage vital signs and the nursing notes.   HISTORY  Chief Complaint Weakness and Near Syncope    HPI Sandra Landry is a 65 y.o. female With a past medical history of anxiety, depression, fibromyalgia, hepatitis C, presents to the emergency department after syncope episode. According to the patient she was getting ready this morning to go to the hospital for her PET scan. States she did use the restroom stood up and walked approximately 10 feet before collapsing and losing consciousness. Does not believe she hit her head. Denies any pain. Patient has a history of sarcoma, states she has chronic nausea for which she receives IM Compazine at home, states she has a history of significant dehydration in the past. Has passed out in the past. Denies any chest pain today. Denies any focal weakness or numbness. Denies neck or back pain.  Past Medical History:  Diagnosis Date  . Anxiety   . Cervical disc disease   . Depression   . Fibromyalgia   . Hepatitis C     Patient Active Problem List   Diagnosis Date Noted  . Weight loss 05/27/2016  . 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 cervical discectomy with fusion    . COLONOSCOPY  01/2016    Prior to Admission medications   Medication Sig Start Date End Date Taking? Authorizing Provider  baclofen (LIORESAL) 20 MG tablet  04/09/16   Historical Provider, MD  cetirizine (ZYRTEC) 10 MG tablet Take by mouth. 06/25/15   Historical Provider, MD  diltiazem (CARDIZEM CD) 120 MG 24 hr capsule Take 120 mg by mouth.    Historical Provider, MD  escitalopram (LEXAPRO) 20 MG tablet  04/26/16   Historical Provider, MD  polyethylene glycol (MIRALAX / GLYCOLAX) packet  05/07/16   Historical Provider, MD  promethazine (PHENERGAN) 25 MG suppository  02/20/16   Historical Provider, MD  ranitidine  (ZANTAC) 300 MG tablet Take 300 mg by mouth. 03/08/15   Historical Provider, MD  ranitidine (ZANTAC) 300 MG tablet  05/17/16   Historical Provider, MD  traMADol (ULTRAM) 50 MG tablet Take by mouth.    Historical Provider, MD    Allergies  Allergen Reactions  . Other Swelling    Dust, ragweed  . Iodides   . Tetanus Toxoids Other (See Comments)    Localized pain and swelling  . Iodine Other (See Comments) and Rash    Limited history. Occurred in 71s, at age 41-12, received contrast at public health facility in [place], while being evaluated for meningitis. Immediate rash - not itchy, "flat, red, perfect circles" over entire body. Has not received iodine contrast since.    No family history on file.  Social History Social History  Substance Use Topics  . Smoking status: Never Smoker  . Smokeless tobacco: Never Used  . Alcohol use Not on file    Review of Systems Constitutional: Negative for fever. Cardiovascular: Negative for chest pain. Respiratory: Negative for shortness of breath. Gastrointestinal: Negative for abdominal pain Musculoskeletal: Negative for back pain.negative for neck pain Neurological: Negative for headaches, focal weakness or numbness. 10-point ROS otherwise negative.  ____________________________________________   PHYSICAL EXAM:  VITAL SIGNS: ED Triage Vitals  Enc Vitals Group     BP 05/30/16 0827 (!) 76/48     Pulse Rate 05/30/16 0827 (!) 124     Resp 05/30/16 0827  15     Temp 05/30/16 0827 97 F (36.1 C)     Temp Source 05/30/16 0827 Oral     SpO2 05/30/16 0827 97 %     Weight 05/30/16 0825 132 lb (59.9 kg)     Height 05/30/16 0825 5\' 6"  (1.676 m)     Head Circumference --      Peak Flow --      Pain Score 05/30/16 0825 3     Pain Loc --      Pain Edu? --      Excl. in [place]? --     Constitutional: Alert and oriented. Well appearing and in no distress. Eyes: Normal exam ENT   Head: Normocephalic and atraumatic.   Mouth/Throat:  Mucous membranes are moist. Cardiovascular: Normal rate, regular rhythm. No murmur Respiratory: Normal respiratory effort without tachypnea nor retractions. Breath sounds are clear  Gastrointestinal: Soft and nontender. No distention.   Musculoskeletal: Nontender with normal range of motion in all extremities. No lower extremity tenderness or edema.no neck or back tenderness. Neurologic:  Normal speech and language. No gross focal neurologic deficits are appreciated. Skin:  Skin is warm, dry and intact.  Psychiatric: Mood and affect are normal.   ____________________________________________    EKG  EKG and interpreted by myself shows sinus tachycardia 124 bpm, narrow QRS, normal axis, normal intervals besides slight QTC prolongation at 509 ms, nonspecific but no concerning ST changes.  ____________________________________________   INITIAL IMPRESSION / ASSESSMENT AND PLAN / ED COURSE  Pertinent labs & imaging results that were available during my care of the patient were reviewed by me and considered in my medical decision making (see chart for details).  The patient presents to the emergency Department after syncopal episode at home. Patient currently tachycardic with a low blood pressure. Patient states she was using the bathroom stood up walking 10 feet before passing out. Consistent with likely orthostatic syncope. We will check labs, IV hydrate, cluster monitor the patient's blood pressure. Currently patient has no complaints, states she feels much better.  Patient continues with significant nausea, hypokalemic receiving IV and by mouth repletion. Patient continues to look very dehydrated and weak, does not believe she'll be able to tolerate fluids at home given her significant nausea. We'll admit to the hospital for generalized weakness and hypokalemia.  ____________________________________________   FINAL CLINICAL IMPRESSION(S) / ED DIAGNOSES  Syncope Hypotension Gen.  Weakness Hypokalemia   Harvest Dark, MD 05/30/16 [phone]

## 2016-05-30 NOTE — H&P (Signed)
[facility] NAME: Sandra Landry    MR#:  [id number]  DATE OF BIRTH:  01-19-1951  DATE OF ADMISSION:  05/30/2016  PRIMARY CARE PHYSICIAN: Landry, Sandra Jude, MD   REQUESTING/REFERRING PHYSICIAN: Dr. Kerman Landry  CHIEF COMPLAINT:   Chief Complaint  Patient presents with  . Weakness  . Near Syncope    HISTORY OF PRESENT ILLNESS:  Sandra Landry  is a 65 y.o. female with a known history of Fibromyalgia, hepatitis C, retroperitoneal mass resins to the emergency room with an episode of syncope at home today morning. Patient has had chronic vomiting with frequent episodes of dehydration and lightheadedness. Was recently seen in the emergency room for the same. Today she was getting very to go for her PET scan, stood up and walked 10 feet and passed out. No seizures, no incontinence. No chest pain. Here in the emergency room patient was found to be hypotensive, tachycardic and hypokalemic.  She is following with Dr. 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  PAST MEDICAL HISTORY:   Past Medical History:  Diagnosis Date  . Anxiety   . Cervical disc disease   . Depression   . Fibromyalgia   . Hepatitis C     PAST SURGICAL HISTORY:   Past Surgical History:  Procedure Laterality Date  . cervical discectomy with fusion    . COLONOSCOPY  01/2016    SOCIAL HISTORY:   Social History  Substance Use Topics  . Smoking status: Never Smoker  . Smokeless tobacco: Never Used  . Alcohol use Not on file    FAMILY HISTORY:  No family history on file. NO CAD/CVA DRUG ALLERGIES:   Allergies  Allergen Reactions  . Other Swelling    Dust, ragweed  . Iodides   . Tetanus Toxoids Other (See Comments)    Localized pain and swelling  . Iodine Other (See Comments) and Rash    Limited history. Occurred in 4s, at age 25-12, received contrast at public health  facility in [place], while being evaluated for meningitis. Immediate rash - not itchy, "flat, red, perfect circles" over entire body. Has not received iodine contrast since.    REVIEW OF SYSTEMS:   ROS  MEDICATIONS AT HOME:   Prior to Admission medications   Medication Sig Start Date End Date Taking? Authorizing Provider  baclofen (LIORESAL) 20 MG tablet  04/09/16   Historical Provider, MD  cetirizine (ZYRTEC) 10 MG tablet Take by mouth. 06/25/15   Historical Provider, MD  diltiazem (CARDIZEM CD) 120 MG 24 hr capsule Take 120 mg by mouth.    Historical Provider, MD  escitalopram (LEXAPRO) 20 MG tablet  04/26/16   Historical Provider, MD  polyethylene glycol (MIRALAX / GLYCOLAX) packet  05/07/16   Historical Provider, MD  promethazine (PHENERGAN) 25 MG suppository  02/20/16   Historical Provider, MD  ranitidine (ZANTAC) 300 MG tablet Take 300 mg by mouth. 03/08/15   Historical Provider, MD  ranitidine (ZANTAC) 300 MG tablet  05/17/16   Historical Provider, MD  traMADol (ULTRAM) 50 MG tablet Take by mouth.    Historical Provider, MD     VITAL SIGNS:  Blood pressure 125/73, pulse 89, temperature 97 F (36.1 C), temperature source Oral, resp. rate 17, height 5\' 6"  (1.676 m), weight 59.9 kg (132 lb), SpO2 96 %.  PHYSICAL EXAMINATION:  Physical Exam  GENERAL:  65 y.o.-year-old patient lying in the bed  with no acute distress.  EYES: Pupils equal, round, reactive to light and accommodation. No scleral icterus. Extraocular muscles intact.  HEENT: Head atraumatic, normocephalic. Oropharynx and nasopharynx clear. No oropharyngeal erythema, moist oral mucosa  NECK:  Supple, no jugular venous distention. No thyroid enlargement, no tenderness.  LUNGS: Normal breath sounds bilaterally, no wheezing, rales, rhonchi. No use of accessory muscles of respiration.  CARDIOVASCULAR: S1, S2 normal. No murmurs, rubs, or gallops.  ABDOMEN: Soft, nontender, nondistended. Bowel sounds present. No organomegaly or  mass.  EXTREMITIES: No pedal edema, cyanosis, or clubbing. + 2 pedal & radial pulses b/l.   NEUROLOGIC: Cranial nerves II through XII are intact. No focal Motor or sensory deficits appreciated b/l PSYCHIATRIC: The patient is alert and oriented x 3. Good affect.  SKIN: No obvious rash, lesion, or ulcer.   LABORATORY PANEL:   CBC  Recent Labs Lab 05/30/16 0844  WBC 20.8*  HGB 11.9*  HCT 35.8  PLT 353   ------------------------------------------------------------------------------------------------------------------  Chemistries   Recent Labs Lab 05/30/16 0844  NA 134*  K 2.5*  CL 101  CO2 20*  GLUCOSE 207*  BUN 15  CREATININE 1.42*  CALCIUM 9.5  MG 2.0  AST 20  ALT 9*  ALKPHOS 72  BILITOT 0.5   ------------------------------------------------------------------------------------------------------------------  Cardiac Enzymes  Recent Labs Lab 05/30/16 0844  TROPONINI <0.03   ------------------------------------------------------------------------------------------------------------------  RADIOLOGY:  No results found.   IMPRESSION AND PLAN:   * Orthostatic syncope due to dehydration from vomiting Replace with IV fluids. Monitor on telemetry  * Acute injury due to dehydration Monitor input and output. Repeat labs in the morning after hydration.  * Hypokalemia Due to decreased intake and vomiting. Patient unable to take by mouth pills. We'll replace through IV. Repeat labs later at night. Add potassium to IV fluids. Check magnesium levels  * Dehydration due to chronic vomiting She uses IM Compazine at home. She is being worked up for retro-peritoneal mass causing right ureteral obstruction with chronic hydronephrosis.  * Leukocytosis Her white blood cell count was elevated during the last visit. Today is likely worse due to hemoconcentration. No source of infection. Will not start any antibiotics. Repeat labs in the morning  * DVT prophylaxis  with Lovenox  Discharge tomorrow if dehydration is resolved and hypokalemia is corrected.  All the records are reviewed and case discussed with ED provider. Management plans discussed with the patient, family and they are in agreement.  CODE STATUS: FULL CODE  TOTAL TIME TAKING CARE OF THIS PATIENT: 40 minutes.   Hillary Bow R M.D on 05/30/2016 at 3:31 PM  Between 7am to 6pm - Pager - [phone]  After 6pm go to [email or web address] - password [email or web address] [facility] [facility]  Office  [phone]  CC: Primary care physician; Sandra Jude, MD  Note: This dictation was prepared with Dragon dictation along with smaller phrase technology. Any transcriptional errors that result from this process are unintentional.

## 2016-05-30 NOTE — ED Notes (Signed)
Pt and significant other wish to complete advance directive paper work.  Chaplain notified and to bedside at this time to assist in doing so.  1C RN aware as well.

## 2016-05-30 NOTE — ED Triage Notes (Signed)
Brought in via family from home  States she had 2 possible syncopal episodes this am

## 2016-05-31 ENCOUNTER — Inpatient Hospital Stay: Payer: 59 | Admitting: Hematology and Oncology

## 2016-05-31 DIAGNOSIS — E860 Dehydration: Secondary | ICD-10-CM | POA: Diagnosis not present

## 2016-05-31 LAB — CBC
HCT: 27.5 % — ABNORMAL LOW (ref 35.0–47.0)
HEMOGLOBIN: 8.8 g/dL — AB (ref 12.0–16.0)
MCH: 25.9 pg — ABNORMAL LOW (ref 26.0–34.0)
MCHC: 31.9 g/dL — ABNORMAL LOW (ref 32.0–36.0)
MCV: 81.3 fL (ref 80.0–100.0)
Platelets: 244 10*3/uL (ref 150–440)
RBC: 3.38 MIL/uL — AB (ref 3.80–5.20)
RDW: 15 % — ABNORMAL HIGH (ref 11.5–14.5)
WBC: 16.3 10*3/uL — ABNORMAL HIGH (ref 3.6–11.0)

## 2016-05-31 LAB — BASIC METABOLIC PANEL
ANION GAP: 5 (ref 5–15)
BUN: 9 mg/dL (ref 6–20)
CALCIUM: 8 mg/dL — AB (ref 8.9–10.3)
CO2: 19 mmol/L — AB (ref 22–32)
Chloride: 113 mmol/L — ABNORMAL HIGH (ref 101–111)
Creatinine, Ser: 0.87 mg/dL (ref 0.44–1.00)
Glucose, Bld: 95 mg/dL (ref 65–99)
Potassium: 3.6 mmol/L (ref 3.5–5.1)
Sodium: 137 mmol/L (ref 135–145)

## 2016-05-31 NOTE — Progress Notes (Signed)
Discharge paperwork reviewed with patient who verbalized understanding. Patient's spouse to transport home.

## 2016-05-31 NOTE — Progress Notes (Deleted)
[facility] day:  05/31/2016   Chief Complaint: Sandra Landry is a 65 y.o. female with a retroperitoneal mass who is seen for review of interval PET scan and discussion regarding direction of therapy.  HPI: The patient was last seen in the medical oncology clinic on 05/25/2016 for initial consultation.  Abdominal and pelvic CT scan had revealed a 5.7 cm right lower quadrant retroperitoneal mass.  Mass was not amendable to CT guided biopsy.  Decision was made to pursue PET scan.  At last visit, she had significant RLQ pain and decreased urine output secondary to decreased oral intake.  She was in mild/moderate distress.  She was transported to the ER.  She recieved IVF.  She declined admission.  She was seen in the ER on 05/30/2016 with generalized weakness, dehydration, and syncope.  She was admitted to the hospital for IVF and electrolyte replacement.  She missed her PET scan.   Past Medical History:  Diagnosis Date  . Anxiety   . Cervical disc disease   . Depression   . Fibromyalgia   . Hepatitis C     Past Surgical History:  Procedure Laterality Date  . cervical discectomy with fusion    . COLONOSCOPY  01/2016    No family history on file.  Social History:  reports that she has never smoked. She has never used smokeless tobacco. Her alcohol and drug histories are not on file.  She lives in [place].  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  She has also worked in the Physicist, medical.  The patient is accompanied by her partner, Sandra Landry, a nurse, today.  Allergies:  Allergies  Allergen Reactions  . Other Swelling    Dust, ragweed  . Iodides   . Tetanus Toxoids Other (See Comments)    Localized pain and swelling  . Iodine Other (See Comments) and Rash    Limited history. Occurred in 23s, at age 70-12, received contrast at public health facility in [place], while being evaluated for meningitis. Immediate rash - not itchy,  "flat, red, perfect circles" over entire body. Has not received iodine contrast since.    Current Medications: No current facility-administered medications for this visit.    No current outpatient prescriptions on file.   Facility-Administered Medications Ordered in Other Visits  Medication Dose Route Frequency Provider Last Rate Last Dose  . 0.9 %  sodium chloride infusion  250 mL Intravenous PRN Sandra Sudini, MD      . 0.9 % NaCl with KCl 20 mEq/ L  infusion   Intravenous Continuous Sandra Sudini, MD      . acetaminophen (TYLENOL) tablet 650 mg  650 mg Oral Q6H PRN Sandra Bow, MD       Or  . acetaminophen (TYLENOL) suppository 650 mg  650 mg Rectal Q6H PRN Sandra Sudini, MD      . albuterol (PROVENTIL) (2.5 MG/3ML) 0.083% nebulizer solution 2.5 mg  2.5 mg Nebulization Q2H PRN Sandra Sudini, MD      . baclofen (LIORESAL) tablet 20 mg  20 mg Oral QID PRN Sandra Bow, MD   20 mg at 05/30/16 2103  . bisacodyl (DULCOLAX) suppository 10 mg  10 mg Rectal Daily PRN Sandra Sudini, MD      . enoxaparin (LOVENOX) injection 40 mg  40 mg Subcutaneous Q24H Sandra Bow, MD   40 mg at 05/30/16 1720  . escitalopram (LEXAPRO) tablet 20 mg  20 mg Oral Daily Sandra  Sudini, MD   20 mg at 05/30/16 1720  . HYDROcodone-acetaminophen (NORCO/VICODIN) 5-325 MG per tablet 1-2 tablet  1-2 tablet Oral Q4H PRN Sandra Bow, MD   2 tablet at 05/30/16 2302  . loratadine (CLARITIN) tablet 10 mg  10 mg Oral Daily Sandra Bow, MD   10 mg at 05/30/16 1720  . ondansetron (ZOFRAN) tablet 4 mg  4 mg Oral Q6H PRN Sandra Bow, MD       Or  . ondansetron (ZOFRAN) injection 4 mg  4 mg Intravenous Q6H PRN Sandra Sudini, MD      . polyethylene glycol (MIRALAX / GLYCOLAX) packet 17 g  17 g Oral Daily PRN Sandra Sudini, MD      . sodium chloride flush (NS) 0.9 % injection 3 mL  3 mL Intravenous Q12H Sandra Sudini, MD   3 mL at 05/30/16 1651  . sodium chloride flush (NS) 0.9 % injection 3 mL  3 mL Intravenous Q12H Sandra  Sudini, MD   3 mL at 05/30/16 1651  . sodium chloride flush (NS) 0.9 % injection 3 mL  3 mL Intravenous PRN Sandra Bow, MD        Review of Systems:  GENERAL:  Feels "bad".  No fevers or sweats.  Weight loss of 35 pounds in year. PERFORMANCE STATUS (ECOG):  2 HEENT:  No visual changes, runny nose, sore throat, mouth sores or tenderness. Lungs:  Shortness of breath x 2 days.  No cough.  No hemoptysis. Cardiac:  No chest pain, palpitations, orthopnea, or PND. GI:  Pain in RLQ which radiates to buttocks and hip.  No appetite.  Chronic nausea and vomiting.  Irritable bowel.  Constipation.  No diarrhea, melena or hematochezia. GU:  Decreased urine output.  No urgency, frequency, dysuria, or hematuria. Musculoskeletal:  No back pain.  No joint pain.  No muscle tenderness. Extremities:  No pain or swelling. Skin:  No rashes or skin changes. Neuro:  No headache, numbness or weakness, balance or coordination issues. Endocrine:  No diabetes, thyroid issues, hot flashes or night sweats. Psych:  Hypersensitive.  PTSD.  Depression.  Anxiety. Pain:  RLQ pain. Review of systems:  All other systems reviewed and found to be negative.  Physical Exam: There were no vitals taken for this visit. GENERAL:  Well developed, well nourished, woman lying on the exam room in mild/moderate distress. MENTAL STATUS:  Alert and oriented to person, place and time. HEAD:  Short dark hair with graying.  Normocephalic, atraumatic, face symmetric, no Cushingoid features. EYES:  Pupils equal round and reactive to light and accomodation.  No conjunctivitis or scleral icterus. ENT:  Oropharynx clear without lesion.  No upper teeth.  Tongue normal. Mucous membranes moist.  RESPIRATORY:  Clear to auscultation without rales, wheezes or rhonchi. CARDIOVASCULAR:  Regular rate and rhythm without murmur, rub or gallop. ABDOMEN:  Soft, tender RLQ without guarding or rebound tenderness.  Mass like fullness in RLQ.  Active bowel  sounds and no hepatosplenomegaly.   SKIN:  No rashes, ulcers or lesions. EXTREMITIES: No edema, no skin discoloration or tenderness.  No palpable cords. LYMPH NODES: No palpable cervical, supraclavicular, axillary or inguinal adenopathy  NEUROLOGICAL: Unremarkable. PSYCH:  Appropriate.   Admission on 05/30/2016  Component Date Value Ref Range Status  . WBC 05/30/2016 20.8* 3.6 - 11.0 K/uL Final  . RBC 05/30/2016 4.54  3.80 - 5.20 MIL/uL Final  . Hemoglobin 05/30/2016 11.9* 12.0 - 16.0 g/dL Final  . HCT 05/30/2016 35.8  35.0 - 47.0 %  Final  . MCV 05/30/2016 79.0* 80.0 - 100.0 fL Final  . MCH 05/30/2016 26.3  26.0 - 34.0 pg Final  . MCHC 05/30/2016 33.3  32.0 - 36.0 g/dL Final  . RDW 05/30/2016 14.9* 11.5 - 14.5 % Final  . Platelets 05/30/2016 353  150 - 440 K/uL Final  . Sodium 05/30/2016 134* 135 - 145 mmol/L Final  . Potassium 05/30/2016 2.5* 3.5 - 5.1 mmol/L Final   Comment: CRITICAL RESULT CALLED TO, READ BACK BY AND VERIFIED WITH Sandra Landry AT 0919 ON 05/30/16.Marland KitchenMarland KitchenLock Haven   . Chloride 05/30/2016 101  101 - 111 mmol/L Final  . CO2 05/30/2016 20* 22 - 32 mmol/L Final  . Glucose, Bld 05/30/2016 207* 65 - 99 mg/dL Final  . BUN 05/30/2016 15  6 - 20 mg/dL Final  . Creatinine, Ser 05/30/2016 1.42* 0.44 - 1.00 mg/dL Final  . Calcium 05/30/2016 9.5  8.9 - 10.3 mg/dL Final  . Total Protein 05/30/2016 7.1  6.5 - 8.1 g/dL Final  . Albumin 05/30/2016 3.4* 3.5 - 5.0 g/dL Final  . AST 05/30/2016 20  15 - 41 U/L Final  . ALT 05/30/2016 9* 14 - 54 U/L Final  . Alkaline Phosphatase 05/30/2016 72  38 - 126 U/L Final  . Total Bilirubin 05/30/2016 0.5  0.3 - 1.2 mg/dL Final  . GFR calc non Af Amer 05/30/2016 38* >60 mL/min Final  . GFR calc Af Amer 05/30/2016 45* >60 mL/min Final   Comment: (NOTE) The eGFR has been calculated using the CKD EPI equation. This calculation has not been validated in all clinical situations. eGFR's persistently <60 mL/min signify possible Chronic Kidney Disease.   .  Anion gap 05/30/2016 13  5 - 15 Final  . Troponin I 05/30/2016 <0.03  <0.03 ng/mL Final  . Color, Urine 05/30/2016 YELLOW* YELLOW Final  . APPearance 05/30/2016 CLEAR* CLEAR Final  . Glucose, UA 05/30/2016 NEGATIVE  NEGATIVE mg/dL Final  . Bilirubin Urine 05/30/2016 NEGATIVE  NEGATIVE Final  . Ketones, ur 05/30/2016 TRACE* NEGATIVE mg/dL Final  . Specific Gravity, Urine 05/30/2016 1.013  1.005 - 1.030 Final  . Hgb urine dipstick 05/30/2016 NEGATIVE  NEGATIVE Final  . pH 05/30/2016 6.0  5.0 - 8.0 Final  . Protein, ur 05/30/2016 30* NEGATIVE mg/dL Final  . Nitrite 05/30/2016 NEGATIVE  NEGATIVE Final  . Leukocytes, UA 05/30/2016 NEGATIVE  NEGATIVE Final  . RBC / HPF 05/30/2016 0-5  0 - 5 RBC/hpf Final  . WBC, UA 05/30/2016 0-5  0 - 5 WBC/hpf Final  . Bacteria, UA 05/30/2016 NONE SEEN  NONE SEEN Final  . Squamous Epithelial / LPF 05/30/2016 0-5* NONE SEEN Final  . Mucous 05/30/2016 PRESENT   Final  . Magnesium 05/30/2016 2.0  1.7 - 2.4 mg/dL Final  . Potassium 05/30/2016 3.5  3.5 - 5.1 mmol/L Final    Assessment:  Jobeth Pangilinan is a 65 y.o. 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  She has a history of lower abdomen for 2 years felt secondary to irritable bowel syndrome. For the past 2 months she is had RLQ pain which has extended to her right buttock and hip.  She has lost 35 pounds in the past year.  Abdominal and pelvic CT scan on 05/14/2016 revealed a 5.7 cm right lower quadrant retroperitoneal mass highly concerning for malignancy. The mass involves the sigmoid colon but is favored to reflect a primary retroperitoneal tumor (such as sarcoma) over a primary colonic mass.  There was  chronic right ureteral obstruction by the mass with hydronephrosis and renal atrophy.  There were multiple subcentimeter liver lesions, indeterminate.   Colonoscopy at [facility] on 07/18/2015 revealed 3 adenomatous polyps.  Symptomatically, has significant RLQ pain  and decreased urine output secondary to decreased oral intake.  She is in mild/moderate distress.  Plan: 1.  Review imaging with patient.  Discuss differential diagnosis (sarcoma, GIST,  lymphoma, germ cell, desmoid).  Suspect sarcoma.  Discuss presentation at tumor board.  Discuss need for biopsy.  Unable to obtain biopsy via CT secondary to vasculature and bowels. Discuss plan for outpatient PET scan on 05/30/2016 to assess for other potential biopsy site.  If no other site of disease, discuss laparoscopic biopsy or endoscopic biopsy with ultrasound through the colon. 2.  Discuss current symptomatology with the poor oral intake, dehydration and inadequate pain control. Discuss referral to the ER.  Her partner feels that she needs to be admitted to the hospital.   3.  Transfer to the ER. 4.  PET scan on 05/30/2016.  5.  RTC in 1 week for review of PET scan and plan for biopsy.   Sandra Asal, MD  05/31/2016, 4:13 AM

## 2016-05-31 NOTE — Discharge Summary (Signed)
[facility] [place] NAME: Sandra Landry    MR#:  [id number]  DATE OF BIRTH:  Feb 20, 1951  DATE OF ADMISSION:  05/30/2016 ADMITTING PHYSICIAN: Hillary Bow, MD  DATE OF DISCHARGE: 05/31/2016   PRIMARY CARE PHYSICIAN: BLISS, Lynnell Jude, MD    ADMISSION DIAGNOSIS:  Dehydration [E86.0] Hypokalemia [E87.6] Weakness [R53.1]  DISCHARGE DIAGNOSIS:  Active Problems:   Syncope   SECONDARY DIAGNOSIS:   Past Medical History:  Diagnosis Date  . Anxiety   . Cervical disc disease   . Depression   . Fibromyalgia   . 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  1. Orthostatic syncope due to dehydration from vomiting: Patient is no longer orthostatic. She was fluid resuscitated.  2. Acute kidney injury due to dehydration: Creatinine has improved. 3. Hypokalemia: This is due to vomiting. Potassium has been repleted and is within normal limits.  4. Chronic vomiting: This is likely due to a combination of her underlying retroperitoneal mass and constipation. Next line patient did have a bowel movement prior to discharge. Vomiting has improved.   5. Leukocytosis: White blood cell count has improved. At discharge her white blood cell count is 16 which is what it was during last hospital stay. She had no signs of infection.     DISCHARGE CONDITIONS AND DIET:   Stable for discharge on regular diet   CONSULTS OBTAINED:    DRUG ALLERGIES:   Allergies  Allergen Reactions  . Other Swelling    Dust, ragweed  . Iodides   . Tetanus Toxoids Other (See Comments)    Localized pain and swelling  . Iodine Other (See Comments) and Rash    Limited history. Occurred in 4s, at age 7-12, received contrast at public health facility in [place], while being evaluated for meningitis. Immediate rash - not  itchy, "flat, red, perfect circles" over entire body. Has not received iodine contrast since.    DISCHARGE MEDICATIONS:   Current Discharge Medication List    CONTINUE these medications which have NOT CHANGED   Details  baclofen (LIORESAL) 20 MG tablet Take 20 mg by mouth 4 (four) times daily as needed for muscle spasms.     cetirizine (ZYRTEC) 10 MG tablet Take 10 mg by mouth daily.     diltiazem (CARDIZEM CD) 120 MG 24 hr capsule Take 120 mg by mouth daily.     escitalopram (LEXAPRO) 20 MG tablet Take 20 mg by mouth daily.     polyethylene glycol (MIRALAX / GLYCOLAX) packet Take 17 g by mouth daily as needed.     prochlorperazine (COMPAZINE) 5 MG/ML injection Inject 5-10 mg into the vein as directed. Every 4 to 6 hours as needed.    promethazine (PHENERGAN) 25 MG suppository Place 25 mg rectally as directed. Every 4 to 6 hours as needed    ranitidine (ZANTAC) 300 MG tablet Take 300 mg by mouth daily.     traMADol (ULTRAM) 50 MG tablet Take 50-100 mg by mouth every 4 (four) hours as needed.               Today   CHIEF COMPLAINT:   Patient doing very well this point she had a bowel movement. No more nausea. No more lightheadedness or dizziness.    VITAL SIGNS:  Blood pressure 120/73, pulse 97, temperature 98.3 F (  36.8 C), temperature source Oral, resp. rate 18, height 5\' 6"  (1.676 m), weight 59.9 kg (132 lb), SpO2 97 %.   REVIEW OF SYSTEMS:  Review of Systems  Constitutional: Negative.  Negative for chills, fever and malaise/fatigue.  HENT: Negative.  Negative for ear discharge, ear pain, hearing loss, nosebleeds and sore throat.   Eyes: Negative.  Negative for blurred vision and pain.  Respiratory: Negative.  Negative for cough, hemoptysis, shortness of breath and wheezing.   Cardiovascular: Negative.  Negative for chest pain, palpitations and leg swelling.  Gastrointestinal: Negative.  Negative for abdominal pain, blood in stool, diarrhea, nausea and  vomiting.  Genitourinary: Negative.  Negative for dysuria.  Musculoskeletal: Negative.  Negative for back pain.  Skin: Negative.   Neurological: Negative for dizziness, tremors, speech change, focal weakness, seizures and headaches.  Endo/Heme/Allergies: Negative.  Does not bruise/bleed easily.  Psychiatric/Behavioral: Negative.  Negative for depression, hallucinations and suicidal ideas.     PHYSICAL EXAMINATION:  GENERAL:  65 y.o.-year-old patient lying in the bed with no acute distress.  NECK:  Supple, no jugular venous distention. No thyroid enlargement, no tenderness.  LUNGS: Normal breath sounds bilaterally, no wheezing, rales,rhonchi  No use of accessory muscles of respiration.  CARDIOVASCULAR: S1, S2 normal. No murmurs, rubs, or gallops.  ABDOMEN: Soft, non-tender, non-distended. Bowel sounds present. No organomegaly or mass.  EXTREMITIES: No pedal edema, cyanosis, or clubbing.  PSYCHIATRIC: The patient is alert and oriented x 3.  SKIN: No obvious rash, lesion, or ulcer.   DATA REVIEW:   CBC  Recent Labs Lab 05/31/16 0508  WBC 16.3*  HGB 8.8*  HCT 27.5*  PLT 244    Chemistries   Recent Labs Lab 05/30/16 0844  05/31/16 0508  NA 134*  --  137  K 2.5*  < > 3.6  CL 101  --  113*  CO2 20*  --  19*  GLUCOSE 207*  --  95  BUN 15  --  9  CREATININE 1.42*  --  0.87  CALCIUM 9.5  --  8.0*  MG 2.0  --   --   AST 20  --   --   ALT 9*  --   --   ALKPHOS 72  --   --   BILITOT 0.5  --   --   < > = values in this interval not displayed.  Cardiac Enzymes  Recent Labs Lab 05/30/16 0844  TROPONINI <0.03    Microbiology Results  @[email or web address] @  RADIOLOGY:  No results found.    Management plans discussed with the patient and she is in agreement. Stable for discharge home  Patient should follow up with pcp  CODE STATUS:     Code Status Orders        Start     Ordered   05/30/16 1528  Full code  Continuous     05/30/16 1530    Code Status History     Date Active Date Inactive Code Status Order ID Comments User Context   This patient has a current code status but no historical code status.      TOTAL TIME TAKING CARE OF THIS PATIENT: 36 minutes.    Note: This dictation was prepared with Dragon dictation along with smaller phrase technology. Any transcriptional errors that result from this process are unintentional.  Victorino Fatzinger M.D on 05/31/2016 at 10:48 AM  Between 7am to 6pm - Pager - [phone] After 6pm go to [email or web address] - [place]  [facility]  Office  [phone]  CC: Primary care physician; Lynnell Jude, MD

## 2016-06-01 ENCOUNTER — Emergency Department: Payer: 59

## 2016-06-01 ENCOUNTER — Emergency Department
Admission: EM | Admit: 2016-06-01 | Discharge: 2016-06-01 | Disposition: A | Discharge status: Home / Self Care | Payer: 59 | Attending: Student in an Organized Health Care Education/Training Program | Admitting: Student in an Organized Health Care Education/Training Program

## 2016-06-01 DIAGNOSIS — Z79899 Other long term (current) drug therapy: Secondary | ICD-10-CM | POA: Insufficient documentation

## 2016-06-01 DIAGNOSIS — R55 Syncope and collapse: Secondary | ICD-10-CM

## 2016-06-01 DIAGNOSIS — R911 Solitary pulmonary nodule: Secondary | ICD-10-CM | POA: Insufficient documentation

## 2016-06-01 DIAGNOSIS — E876 Hypokalemia: Secondary | ICD-10-CM | POA: Insufficient documentation

## 2016-06-01 HISTORY — DX: Unspecified osteoarthritis, unspecified site: M19.90

## 2016-06-01 LAB — COMPREHENSIVE METABOLIC PANEL
ALT: 11 U/L — ABNORMAL LOW (ref 14–54)
AST: 15 U/L (ref 15–41)
Albumin: 2.8 g/dL — ABNORMAL LOW (ref 3.5–5.0)
Alkaline Phosphatase: 57 U/L (ref 38–126)
Anion gap: 9 (ref 5–15)
BILIRUBIN TOTAL: 0.3 mg/dL (ref 0.3–1.2)
BUN: 13 mg/dL (ref 6–20)
CO2: 22 mmol/L (ref 22–32)
CREATININE: 1.12 mg/dL — AB (ref 0.44–1.00)
Calcium: 8.6 mg/dL — ABNORMAL LOW (ref 8.9–10.3)
Chloride: 101 mmol/L (ref 101–111)
GFR, EST AFRICAN AMERICAN: 59 mL/min — AB (ref >60)
GFR, EST NON AFRICAN AMERICAN: 51 mL/min — AB (ref >60)
Glucose, Bld: 111 mg/dL — ABNORMAL HIGH (ref 65–99)
POTASSIUM: 3.1 mmol/L — AB (ref 3.5–5.1)
Sodium: 132 mmol/L — ABNORMAL LOW (ref 135–145)
TOTAL PROTEIN: 5.5 g/dL — AB (ref 6.5–8.1)

## 2016-06-01 LAB — CBC WITH DIFFERENTIAL/PLATELET
BASOS ABS: 0.1 10*3/uL (ref 0–0.1)
Basophils Relative: 1 %
EOS PCT: 2 %
Eosinophils Absolute: 0.3 10*3/uL (ref 0–0.7)
HEMATOCRIT: 27.8 % — AB (ref 35.0–47.0)
Hemoglobin: 9.3 g/dL — ABNORMAL LOW (ref 12.0–16.0)
LYMPHS ABS: 2.6 10*3/uL (ref 1.0–3.6)
LYMPHS PCT: 13 %
MCH: 26.6 pg (ref 26.0–34.0)
MCHC: 33.6 g/dL (ref 32.0–36.0)
MCV: 79.3 fL — AB (ref 80.0–100.0)
MONO ABS: 1.3 10*3/uL — AB (ref 0.2–0.9)
MONOS PCT: 6 %
NEUTROS ABS: 15.5 10*3/uL — AB (ref 1.4–6.5)
Neutrophils Relative %: 78 %
PLATELETS: 277 10*3/uL (ref 150–440)
RBC: 3.5 MIL/uL — ABNORMAL LOW (ref 3.80–5.20)
RDW: 14.7 % — AB (ref 11.5–14.5)
WBC: 19.8 10*3/uL — ABNORMAL HIGH (ref 3.6–11.0)

## 2016-06-01 LAB — URINALYSIS COMPLETE WITH MICROSCOPIC (ARMC ONLY)
Bacteria, UA: NONE SEEN
Bilirubin Urine: NEGATIVE
Glucose, UA: NEGATIVE mg/dL
Hgb urine dipstick: NEGATIVE
KETONES UR: NEGATIVE mg/dL
Leukocytes, UA: NEGATIVE
NITRITE: NEGATIVE
PROTEIN: NEGATIVE mg/dL
SPECIFIC GRAVITY, URINE: 1.004 — AB (ref 1.005–1.030)
pH: 6 (ref 5.0–8.0)

## 2016-06-01 LAB — TROPONIN I
Troponin I: <0.03 ng/mL (ref <0.03)
Troponin I: <0.03 ng/mL (ref <0.03)

## 2016-06-01 LAB — MAGNESIUM: MAGNESIUM: 1.7 mg/dL (ref 1.7–2.4)

## 2016-06-01 LAB — FIBRIN DERIVATIVES D-DIMER (ARMC ONLY): Fibrin derivatives D-dimer (ARMC): 1697 — ABNORMAL HIGH (ref 0–499)

## 2016-06-01 MED ORDER — HYDROCORTISONE NA SUCCINATE PF 250 MG IJ SOLR: 200.0000 mg | Freq: Once | INTRAMUSCULAR | Status: DC

## 2016-06-01 MED ORDER — MAGNESIUM SULFATE 2 GM/50ML IV SOLN
2.0000 g | Freq: Once | INTRAVENOUS | Status: AC
  Administered 2016-06-01: 2 g via INTRAVENOUS
  Filled 2016-06-01: qty 50

## 2016-06-01 MED ORDER — IOPAMIDOL (ISOVUE-370) INJECTION 76%
75.0000 mL | Freq: Once | INTRAVENOUS | Status: AC | PRN
  Administered 2016-06-01: 75 mL via INTRAVENOUS

## 2016-06-01 MED ORDER — SODIUM CHLORIDE 0.9 % IV BOLUS (SEPSIS)
1000.0000 mL | Freq: Once | INTRAVENOUS | Status: AC
  Administered 2016-06-01: 1000 mL via INTRAVENOUS

## 2016-06-01 MED ORDER — DIPHENHYDRAMINE HCL 50 MG/ML IJ SOLN
50.0000 mg | Freq: Once | INTRAMUSCULAR | Status: AC
  Administered 2016-06-01: 50 mg via INTRAVENOUS
  Filled 2016-06-01: qty 1

## 2016-06-01 MED ORDER — POTASSIUM CHLORIDE CRYS ER 20 MEQ PO TBCR
40.0000 meq | EXTENDED_RELEASE_TABLET | Freq: Once | ORAL | Status: AC
  Administered 2016-06-01: 40 meq via ORAL
  Filled 2016-06-01: qty 2

## 2016-06-01 MED ORDER — HYDROCORTISONE NA SUCCINATE PF 100 MG IJ SOLR
200.0000 mg | Freq: Once | INTRAVENOUS | Status: AC
  Administered 2016-06-01: 200 mg via INTRAVENOUS
  Filled 2016-06-01: qty 4

## 2016-06-01 MED ORDER — POTASSIUM CHLORIDE ER 10 MEQ PO TBCR: 20.0000 meq | EXTENDED_RELEASE_TABLET | Freq: Every day | ORAL | 0 refills | Status: DC

## 2016-06-01 NOTE — ED Provider Notes (Signed)
-----------------------------------------   3:16 PM on 06/01/2016 -----------------------------------------   Blood pressure 105/68, pulse 79, temperature 97.8 F (36.6 C), temperature source Oral, resp. rate 20, height 5\' 6"  (1.676 m), weight 132 lb (59.9 kg), SpO2 96 %.  Assuming care from Dr. Sharlett Iles of Lynnell Chakrabarti is a 65 y.o. female with a chief complaint of Loss of Consciousness .    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 Patient was recently admitted for similar and was found to have dehydration and hypokalemia. Patient is being evaluated by her primary care doctor for these episodes of syncope. She is scheduled to undergo a PET scan. The episode today happened while she was cooking breakfast, patient felt nauseous and lightheaded and then fell to the ground. She denies falling passing out. Her labs here were with no acute findings other than an elevated d-dimer. Patient had a CTA pending to rule out PE. She has an allergic reaction to iodine therefore she is undergoing pre-treatment with steroids, Benadryl and Pepcid. I was asked by Dr. Sharlett Iles to follow up the results of her CTA. Patient was given supplementation for her mild hypokalemia here and also IVF for mild AKI.  _________________________ [id number] PM on 06/01/2016 -----------------------------------------  CTA with no evidence of pulmonary embolism or pneumonia. Patient does have leukocytosis however that's chronic for her. She denies any signs or symptoms of pneumonia. Troponin 2 is negative. Patient reports that she feels back to her normal. I discussed with patient and incidental finding of a 4 mm nodule in her left lower lobe. Patient is scheduled to undergo a PET scan. I recommended that she follow-up with her doctor for repeat imaging in 12 months if she does not undergo the PET scan. Patient is comfortable  with this plan and she will discuss it with her doctor on her upcoming visit this week. We'll discharge patient home at this time.    Rudene Re, MD 06/01/16 2121

## 2016-06-01 NOTE — ED Provider Notes (Signed)
[facility] Emergency Department Provider Note    First MD Initiated Contact with Patient 06/01/16 [phone]     (approximate)  I have reviewed the triage vital signs and the nursing notes.   HISTORY  Chief Complaint Loss of Consciousness    HPI Sandra Landry is a 65 y.o. 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 Patient states that she was cooking breakfast began to feel nauseated and lightheaded and fell to the ground. Denies hitting her head and denies full loss of consciousness. Patient was recently admitted for similar symptoms as well as persistent dehydration and hypokalemia. Patient denies any recent changes to medications. Denies any shortness of breath or chest pain. Denies any lower extremity swelling. Denies any abdominal pain, diarrhea or vomiting.   Past Medical History:  Diagnosis Date  . Anxiety   . Arthritis   . Cervical disc disease   . Depression   . Fibromyalgia   . Hepatitis C     Patient Active Problem List   Diagnosis Date Noted  . Syncope 05/30/2016  . Weight loss 05/27/2016  . 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 cervical discectomy with fusion    . COLONOSCOPY  01/2016    Prior to Admission medications   Medication Sig Start Date End Date Taking? Authorizing Provider  baclofen (LIORESAL) 20 MG tablet Take 20 mg by mouth 4 (four) times daily as needed for muscle spasms.  04/09/16   Historical Provider, MD  cetirizine (ZYRTEC) 10 MG tablet Take 10 mg by mouth daily.  06/25/15   Historical Provider, MD  diltiazem (CARDIZEM CD) 120 MG 24 hr capsule Take 120 mg by mouth daily.     Historical Provider, MD  escitalopram (LEXAPRO) 20 MG tablet Take 20 mg by mouth daily.  04/26/16   Historical Provider, MD  polyethylene glycol (MIRALAX / GLYCOLAX)  packet Take 17 g by mouth daily as needed.  05/07/16   Historical Provider, MD  prochlorperazine (COMPAZINE) 5 MG/ML injection Inject 5-10 mg into the vein as directed. Every 4 to 6 hours as needed.    Historical Provider, MD  promethazine (PHENERGAN) 25 MG suppository Place 25 mg rectally as directed. Every 4 to 6 hours as needed 02/20/16   Historical Provider, MD  ranitidine (ZANTAC) 300 MG tablet Take 300 mg by mouth daily.  03/08/15   Historical Provider, MD  traMADol (ULTRAM) 50 MG tablet Take 50-100 mg by mouth every 4 (four) hours as needed.     Historical Provider, MD    Allergies Other; Iodides; Tetanus toxoids; and Iodine  No family history on file.  Social History Social History  Substance Use Topics  . Smoking status: Never Smoker  . Smokeless tobacco: Never Used  . Alcohol use No    Review of Systems Patient denies headaches, rhinorrhea, blurry vision, numbness, shortness of breath, chest pain, edema, cough, abdominal pain, nausea, vomiting, diarrhea, dysuria, fevers, rashes or hallucinations unless otherwise stated above in HPI. ____________________________________________   PHYSICAL EXAM:  VITAL SIGNS: Vitals:   06/01/16 0945  BP: 92/62  Pulse: 84  Resp: 16  Temp: 97.8 F (36.6 C)    Constitutional: Alert and oriented. Frail elderly female Eyes: Conjunctivae are normal. PERRL. EOMI. Head: Atraumatic. Nose: No congestion/rhinnorhea. Mouth/Throat: Mucous membranes are dry.  Oropharynx non-erythematous. Neck: No stridor. Painless ROM. No cervical  spine tenderness to palpation Hematological/Lymphatic/Immunilogical: No cervical lymphadenopathy. Cardiovascular: Normal rate, regular rhythm. Grossly normal heart sounds.  Good peripheral circulation. Respiratory: Normal respiratory effort.  No retractions. Lungs CTAB. Gastrointestinal: Soft and nontender. No distention. No abdominal bruits. No CVA tenderness. Genitourinary:  Musculoskeletal: No lower extremity  tenderness nor edema.  No joint effusions. Neurologic:  CN- intact.  No facial droop, Normal FNF.  Normal heel to shin.  Sensation intact bilaterally. Normal speech and language. No gross focal neurologic deficits are appreciated. No gait instability.  Skin:  Skin is warm, dry and intact delayed cap refill and skin tenting noted. Psychiatric: Mood and affect are normal. Speech and behavior are normal.  ____________________________________________   LABS (all labs ordered are listed, but only abnormal results are displayed)  Results for orders placed or performed during the hospital encounter of 06/01/16 (from the past 24 hour(s))  CBC with Differential/Platelet     Status: Abnormal   Collection Time: 06/01/16 10:01 AM  Result Value Ref Range   WBC 19.8 (H) 3.6 - 11.0 K/uL   RBC 3.50 (L) 3.80 - 5.20 MIL/uL   Hemoglobin 9.3 (L) 12.0 - 16.0 g/dL   HCT 27.8 (L) 35.0 - 47.0 %   MCV 79.3 (L) 80.0 - 100.0 fL   MCH 26.6 26.0 - 34.0 pg   MCHC 33.6 32.0 - 36.0 g/dL   RDW 14.7 (H) 11.5 - 14.5 %   Platelets 277 150 - 440 K/uL   Neutrophils Relative % 78 %   Neutro Abs 15.5 (H) 1.4 - 6.5 K/uL   Lymphocytes Relative 13 %   Lymphs Abs 2.6 1.0 - 3.6 K/uL   Monocytes Relative 6 %   Monocytes Absolute 1.3 (H) 0.2 - 0.9 K/uL   Eosinophils Relative 2 %   Eosinophils Absolute 0.3 0 - 0.7 K/uL   Basophils Relative 1 %   Basophils Absolute 0.1 0 - 0.1 K/uL  Comprehensive metabolic panel     Status: Abnormal   Collection Time: 06/01/16 10:01 AM  Result Value Ref Range   Sodium 132 (L) 135 - 145 mmol/L   Potassium 3.1 (L) 3.5 - 5.1 mmol/L   Chloride 101 101 - 111 mmol/L   CO2 22 22 - 32 mmol/L   Glucose, Bld 111 (H) 65 - 99 mg/dL   BUN 13 6 - 20 mg/dL   Creatinine, Ser 1.12 (H) 0.44 - 1.00 mg/dL   Calcium 8.6 (L) 8.9 - 10.3 mg/dL   Total Protein 5.5 (L) 6.5 - 8.1 g/dL   Albumin 2.8 (L) 3.5 - 5.0 g/dL   AST 15 15 - 41 U/L   ALT 11 (L) 14 - 54 U/L   Alkaline Phosphatase 57 38 - 126 U/L   Total  Bilirubin 0.3 0.3 - 1.2 mg/dL   GFR calc non Af Amer 51 (L) >60 mL/min   GFR calc Af Amer 59 (L) >60 mL/min   Anion gap 9 5 - 15  Magnesium     Status: None   Collection Time: 06/01/16 10:01 AM  Result Value Ref Range   Magnesium 1.7 1.7 - 2.4 mg/dL  Troponin I     Status: None   Collection Time: 06/01/16 10:01 AM  Result Value Ref Range   Troponin I <0.03 <0.03 ng/mL   ____________________________________________  EKG My review and personal interpretation at Time: 9:46   Indication: syncope  Rate: 80  Rhythm: nsr Axis: normal3 Other: no acute ischemic changes ____________________________________________  RADIOLOGY  CT head with NAICA CTA  chest pending ____________________________________________   PROCEDURES  Procedure(s) performed: none    Critical Care performed: no ____________________________________________   INITIAL IMPRESSION / ASSESSMENT AND PLAN / ED COURSE  Pertinent labs & imaging results that were available during my care of the patient were reviewed by me and considered in my medical decision making (see chart for details).  DDX Dehydration, sepsis, pneumonia, which led abnormality, PE  Maron Entwisle is a 65 y.o. who presents to the ED with recurrent near syncopal events. CT imaging of the head ordered due to concern for acute traumatic injury. EKG shows no acute ischemic changes. Patient not tachycardic and she is otherwise afebrile and hemodynamically stable. Her abdominal exam is soft and benign. She denies any palpitations or chest pain prior to or after the fainting spell. Is not consistent with seizure as she does not fully lose consciousness.  Probable dehydration due to history of recurrent nausea vomiting the patient is at risk for pulmonary embolism based on history of cancer.  The patient will be placed on continuous pulse oximetry and telemetry for monitoring.  Laboratory evaluation will be sent to evaluate for the above complaints.      Clinical Course  Comment By Time  Discussed results of laboratory testing with the patient. She currently feels much better after IV fluids as well as IV magnesium supplementation she is tolerating K Dur potassium. She remains afebrile. Leukocytosis roughly at baseline. Based on his recurrent episodes and her history of cancer d-dimer was ordered to evaluate for PE which was elevated. Therefore a CT imaging was ordered for further characterization. Patient has a remote history of iodine allergy therefore will require pretreatment with steroids and Benadryl.  CT head with NAICA. Merlyn Lot, MD 09/15 1435   Currently awaiting CT imaging of the chest. Patient will be signed out to Dr. Alfred Levins pending results of imaging.  Patient feels well at this time and he suspect that her primary underlying processes dehydration. I anticipate if the CT imaging is negative that she is stable and appropriate for discharge home.  Have discussed with the patient and available family all diagnostics and treatments performed thus far and all questions were answered to the best of my ability. The patient demonstrates understanding and agreement with plan.   ____________________________________________   FINAL CLINICAL IMPRESSION(S) / ED DIAGNOSES  Final diagnoses:  Near syncope      NEW MEDICATIONS STARTED DURING THIS VISIT:  New Prescriptions   No medications on file     Note:  This document was prepared using Dragon voice recognition software and may include unintentional dictation errors.    Merlyn Lot, MD 06/01/16 [phone]

## 2016-06-01 NOTE — Discharge Instructions (Signed)
You have been seen today in the Emergency Department (ED)  for syncope (passing out).  Your workup including labs and EKG did not show a cause for your syncope and were overall reassuring.   ° °Your symptoms may be due to dehydration, so it is important that you drink plenty of non-alcoholic fluids. Emotional stress, pain, or overheating--especially if you have been standing--can make you faint. In these cases, fainting is usually not serious. But fainting can be a sign of a more serious problem. Therefore it is imperative that you follow up with your doctor in 1-2 days for further evaluation. ° °When should you call for help?  °Call 911 anytime you think you may need emergency care. For example, call if:  °You have symptoms of a heart problem. These may include:  °Chest pain or pressure.  °Severe trouble breathing.  °A fast or irregular heartbeat.  °Lightheadedness or sudden weakness.  °Coughing up pink, foamy mucus.  °Passing out. °You have symptoms of a stroke. These may include:  °Sudden numbness, tingling, weakness, or loss of movement in your face, arm, or leg, especially on only one side of your body.  °Sudden vision changes.  °Sudden trouble speaking.  °Sudden confusion or trouble understanding simple statements.  °Sudden problems with walking or balance.  °A sudden, severe headache that is different from past headaches. °You passed out (lost consciousness) again. ° °Watch closely for changes in your health, and be sure to contact your doctor if:  °You do not get better as expected. ° ° How can you care for yourself at home?  °Drink plenty of fluids to prevent dehydration. If you have kidney, heart, or liver disease and have to limit fluids, talk with your doctor before you increase your fluid intake. ° ° °

## 2016-06-01 NOTE — ED Notes (Signed)
Pt given food

## 2016-06-01 NOTE — ED Triage Notes (Signed)
Pt came to ED via EMS c/o syncopal episode this morning. Pt remembers cooking breakfast and then passing out. Pt denies hitting head. Denies pain. Seen recently for dehydration.

## 2016-06-01 NOTE — ED Notes (Signed)
Pt discharged to home.  Family member driving.  Discharge instructions reviewed.  Verbalized understanding.  No questions or concerns at this time.  Teach back verified.  Pt in NAD.  No items left in ED.   

## 2016-06-01 NOTE — ED Notes (Signed)
Chaplain at bedside finishing pts advanced directive.

## 2016-06-06 ENCOUNTER — Ambulatory Visit
Admit: 2016-06-06 | Discharge: 2016-06-06 | Disposition: A | Discharge status: Home / Self Care | Payer: 59 | Attending: Family Medicine | Admitting: Family Medicine

## 2016-06-06 DIAGNOSIS — R935 Abnormal findings on diagnostic imaging of other abdominal regions, including retroperitoneum: Secondary | ICD-10-CM | POA: Diagnosis not present

## 2016-06-06 DIAGNOSIS — N1339 Other hydronephrosis: Secondary | ICD-10-CM | POA: Diagnosis not present

## 2016-06-06 DIAGNOSIS — R1903 Right lower quadrant abdominal swelling, mass and lump: Secondary | ICD-10-CM | POA: Diagnosis not present

## 2016-06-06 DIAGNOSIS — I2510 Atherosclerotic heart disease of native coronary artery without angina pectoris: Secondary | ICD-10-CM | POA: Diagnosis not present

## 2016-06-06 LAB — GLUCOSE, CAPILLARY: GLUCOSE-CAPILLARY: 128 mg/dL — AB (ref 65–99)

## 2016-06-06 MED ORDER — FLUDEOXYGLUCOSE F - 18 (FDG) INJECTION
12.5200 | Freq: Once | INTRAVENOUS | Status: AC | PRN
  Administered 2016-06-06: 12.52 via INTRAVENOUS

## 2016-06-08 ENCOUNTER — Ambulatory Visit: Payer: 59 | Admitting: Hematology and Oncology

## 2016-06-10 NOTE — Progress Notes (Signed)
[facility] day:  06/11/2016   Chief Complaint: Sandra Landry is a 65 y.o. female with a retroperitoneal mass who is seen for review of interval PET scan and discussion regarding direction of therapy.  HPI: The patient was last seen in the medical oncology clinic on 05/25/2016 for initial consultation.  Abdominal and pelvic CT scan had revealed a 5.7 cm right lower quadrant retroperitoneal mass.  Mass was not amendable to CT guided biopsy.  Decision was made to pursue PET scan.  At last visit, she had significant RLQ pain and decreased urine output secondary to decreased oral intake.  She was in mild/moderate distress.  She was transported to the ER.  She recieved IVF.  She declined admission.  She was seen in the ER on 05/30/2016 with generalized weakness, dehydration, and syncope.  She was admitted to the hospital for IVF and electrolyte replacement.  She missed her PET scan.  PET scan on 06/06/2016 revealed hypermetabolism corresponding to the right lower quadrant retroperitoneal mass. Although intimately associated with the rectosigmoid colon, it was felt to be originating outside of the colon given absence of obstruction or significant circumferential colonic wall thickening.  Differential considerations include sarcoma or lymphoma. An infectious process (secondary to remote diverticulitis) was felt much less likely.  There was similar right-sided hydroureteronephrosis due to the right lower quadrant mass.  Symptomatically, she is feeling much better.  Abdominal pain controlled with medications.  She is taking lactulose.   Past Medical History:  Diagnosis Date  . Anxiety   . Arthritis   . Cervical disc disease   . Depression   . Fibromyalgia   . Hepatitis C     Past Surgical History:  Procedure Laterality Date  . cervical discectomy with fusion    . COLONOSCOPY  01/2016    No family history on file.  Social History:  reports that  she has never smoked. She has never used smokeless tobacco. She reports that she uses drugs, including Marijuana. She reports that she does not drink alcohol.  She lives in [place].  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  She has also worked in the Physicist, medical.  The patient is accompanied by her partner, Darliss Ridgel, a nurse, today.  Allergies:  Allergies  Allergen Reactions  . Other Swelling    Dust, ragweed  . Iodides   . Tetanus Toxoids Other (See Comments)    Localized pain and swelling  . Iodine Other (See Comments) and Rash    Limited history. Occurred in 57s, at age 27-12, received contrast at public health facility in [place], while being evaluated for meningitis. Immediate rash - not itchy, "flat, red, perfect circles" over entire body. Has not received iodine contrast since.    Current Medications: Current Outpatient Prescriptions  Medication Sig Dispense Refill  . baclofen (LIORESAL) 20 MG tablet Take 20 mg by mouth 4 (four) times daily as needed for muscle spasms.     . cetirizine (ZYRTEC) 10 MG tablet Take 10 mg by mouth daily.     Marland Kitchen diltiazem (CARDIZEM CD) 120 MG 24 hr capsule Take 120 mg by mouth daily.     . diphenhydrAMINE (BENADRYL) 25 MG tablet Take 25-50 mg by mouth every 6 (six) hours as needed.    Marland Kitchen escitalopram (LEXAPRO) 20 MG tablet Take 20 mg by mouth daily.     Marland Kitchen gabapentin (NEURONTIN) 300 MG capsule Take 300 mg by mouth 2 (two) times daily.    Marland Kitchen  ibuprofen (ADVIL,MOTRIN) 200 MG tablet Take 200-600 mg by mouth every 6 (six) hours as needed for mild pain or moderate pain.    Marland Kitchen lactulose (CHRONULAC) 10 GM/15ML solution     . oxyCODONE-acetaminophen (PERCOCET/ROXICET) 5-325 MG tablet     . polyethylene glycol (MIRALAX / GLYCOLAX) packet Take 17 g by mouth daily as needed.     . potassium chloride (K-DUR) 10 MEQ tablet Take 2 tablets (20 mEq total) by mouth daily. 28 tablet 0  . prochlorperazine (COMPAZINE) 5 MG/ML injection Inject 5-10 mg into the vein as  directed. Every 4 to 6 hours as needed.    . promethazine (PHENERGAN) 25 MG suppository Place 25 mg rectally as directed. Every 4 to 6 hours as needed    . ranitidine (ZANTAC) 300 MG tablet Take 300 mg by mouth daily.     . traMADol (ULTRAM) 50 MG tablet Take 50-100 mg by mouth every 4 (four) hours as needed.      No current facility-administered medications for this visit.     Review of Systems:  GENERAL:  Feels "ok".  No fevers or sweats.  Weight loss of 35 pounds in year (weight up 4 pounds since last visit). PERFORMANCE STATUS (ECOG):  2 HEENT:  No visual changes, runny nose, sore throat, mouth sores or tenderness. Lungs:  Shortness of breath with exertion.  No cough.  No hemoptysis. Cardiac:  No chest pain, palpitations, orthopnea, or PND. GI:  Pain in RLQ which radiates to buttocks and hip.  Appetite 25%.  Chronic nausea and vomiting.  Irritable bowel.  Constipation.  No diarrhea, melena or hematochezia. GU:  No urgency, frequency, dysuria, or hematuria. Musculoskeletal:  No back pain.  No joint pain.  No muscle tenderness. Extremities:  No pain or swelling. Skin:  No rashes or skin changes. Neuro:  No headache, numbness or weakness, balance or coordination issues. Endocrine:  No diabetes, thyroid issues, hot flashes or night sweats. Psych:  Hypersensitive.  PTSD.  Depression.  Anxiety. Pain:  RLQ pain. Review of systems:  All other systems reviewed and found to be negative.  Physical Exam: Blood pressure 108/75, pulse 96, temperature 97.6 F (36.4 C), temperature source Tympanic, resp. rate 18, weight 139 lb 8.8 oz (63.3 kg). GENERAL:  Well developed, well nourished, sitting comfortably the exam room in mild/moderate distress. MENTAL STATUS:  Alert and oriented to person, place and time. HEAD:  Short dark hair with graying.  Normocephalic, atraumatic, face symmetric, no Cushingoid features. EYES:  No conjunctivitis or scleral icterus. NEUROLOGICAL: Unremarkable. PSYCH:   Appropriate.   No visits with results within 3 Day(s) from this visit.  Latest known visit with results is:  Hospital Outpatient Visit on 06/06/2016  Component Date Value Ref Range Status  . Glucose-Capillary 06/06/2016 128* 65 - 99 mg/dL Final    Assessment:  Tsering Lampkin is a 65 y.o. 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  She has a history of lower abdomen for 2 years felt secondary to irritable bowel syndrome. For the past 2 months she is had RLQ pain which has extended to her right buttock and hip.  She has lost 35 pounds in the past year.  Abdominal and pelvic CT scan on 05/14/2016 revealed a 5.7 cm right lower quadrant retroperitoneal mass highly concerning for malignancy. The mass involves the sigmoid colon but is favored to reflect a primary retroperitoneal tumor (such as sarcoma) over a primary colonic mass.  There was chronic right  ureteral obstruction by the mass with hydronephrosis and renal atrophy.  There were multiple subcentimeter liver lesions, indeterminate.   PET scan on 06/06/2016 revealed hypermetabolism (SUV 19.7)  corresponding to the right lower quadrant retroperitoneal mass. It was felt intimately associated with the rectosigmoid colon, but originating outside of the colon. Differential includes sarcoma or lymphoma. There was similar right-sided hydroureteronephrosis due to the right lower quadrant mass.  Colonoscopy at [facility] on 07/18/2015 revealed 3 adenomatous polyps.  Symptomatically, has RLQ pain is controlled.  Plan: 1.  Review PET scan.  No evidence of disease outside original mass noted on CT scan.  Discuss prior conversations.  Suspect sarcoma.  Discuss inability to perform CT guided biopsy.  Discuss laparoscopic biopsy or endoscopic biopsy with ultrasound through the colon.  Discuss endoscopic biopsy at [facility] or when [place] physicians come to [facility] for procedures (next 06/21/2016).  Patient felt comfortable  waiting until 06/21/2016. 2.  Follow-up with Mariea Clonts to arrange appt for [facility] endoscopic biopsy at [facility] on 06/21/2016 3.  RTC on 06/29/2016 for MD assessment and review of biopsy results.   Lequita Asal, MD  06/11/2016, 4:56 PM

## 2016-06-11 ENCOUNTER — Inpatient Hospital Stay (HOSPITAL_BASED_OUTPATIENT_CLINIC_OR_DEPARTMENT_OTHER): Payer: 59 | Admitting: Hematology and Oncology

## 2016-06-11 VITALS — BP 108/75 | HR 96 | Temp 97.6°F | Resp 18 | Wt 139.6 lb

## 2016-06-11 DIAGNOSIS — R1031 Right lower quadrant pain: Secondary | ICD-10-CM | POA: Diagnosis not present

## 2016-06-11 DIAGNOSIS — Z79899 Other long term (current) drug therapy: Secondary | ICD-10-CM

## 2016-06-11 DIAGNOSIS — K769 Liver disease, unspecified: Secondary | ICD-10-CM

## 2016-06-11 DIAGNOSIS — M797 Fibromyalgia: Secondary | ICD-10-CM

## 2016-06-11 DIAGNOSIS — R110 Nausea: Secondary | ICD-10-CM

## 2016-06-11 DIAGNOSIS — R634 Abnormal weight loss: Secondary | ICD-10-CM

## 2016-06-11 DIAGNOSIS — M5030 Other cervical disc degeneration, unspecified cervical region: Secondary | ICD-10-CM

## 2016-06-11 DIAGNOSIS — N131 Hydronephrosis with ureteral stricture, not elsewhere classified: Secondary | ICD-10-CM | POA: Diagnosis not present

## 2016-06-11 DIAGNOSIS — R0602 Shortness of breath: Secondary | ICD-10-CM

## 2016-06-11 DIAGNOSIS — F329 Major depressive disorder, single episode, unspecified: Secondary | ICD-10-CM

## 2016-06-11 DIAGNOSIS — K5900 Constipation, unspecified: Secondary | ICD-10-CM

## 2016-06-11 DIAGNOSIS — Z8619 Personal history of other infectious and parasitic diseases: Secondary | ICD-10-CM

## 2016-06-11 DIAGNOSIS — F419 Anxiety disorder, unspecified: Secondary | ICD-10-CM

## 2016-06-11 DIAGNOSIS — R1900 Intra-abdominal and pelvic swelling, mass and lump, unspecified site: Secondary | ICD-10-CM

## 2016-06-12 ENCOUNTER — Telehealth: Payer: Self-pay

## 2016-06-12 ENCOUNTER — Other Ambulatory Visit: Payer: Self-pay

## 2016-06-12 NOTE — Telephone Encounter (Signed)
  Oncology Nurse Navigator Documentation  Navigator Location: [facility] (06/12/16 1500)   Telephone: Lahoma Crocker Call;Appt Confirmation/Clarification (06/12/16 1500)             Barriers/Navigation Needs: Coordination of Care (06/12/16 1500)   Interventions: Coordination of Care (06/12/16 1500)   Coordination of Care: EUS (06/12/16 1500)        Acuity: Level 2 (06/12/16 1500)   Acuity Level 2: Initial guidance, education and coordination as needed;Educational needs;Ongoing guidance and education throughout treatment as needed (06/12/16 1500)     Time Spent with Patient: 45 (06/12/16 1500)   Received referral for EUS from Dr Mike Gip for lower EUS to obtain biopsy. Called and spoke with Darliss Ridgel. EUS scheduled for 06/21/06 at [facility] with Dr Cephas Darby. Went over instructions as well as Miralax/Gatorade prep and copy also mailed to home address.  Denies diabetes and anticoagulants. Provided contact information for any future question or concerns regarding procedure.   INSTRUCTIONS FOR ENDOSCOPIC ULTRASOUND -Your procedure has been scheduled for October 5th with Dr. Francella Solian at [facility] -The hospital will contact you to pre-register over the phone.  -To get your scheduled arrival time, please call the Endoscopy unit at  [phone] between 1-3pm on:  October 4th   -ON THE DAY OF YOU PROCEDURE:   1. If you are scheduled for a morning procedure, nothing to drink after midnight  -If you are scheduled for an afternoon procedure, you may have clear liquids until 5 hours prior  to the procedure but no carbonated drinks or broth  2. NO FOOD THE DAY OF YOUR PROCEDURE  3. You may take your heart, seizure, blood pressure, Parkinson's or breathing medications at  6am with just enough water to get your pills down  4. Do not take any oral Diabetic medications the morning of your procedure.  5. If you are a diabetic and are using insulin, please notify your prescribing physician of this  procedure  as your dose may need to be altered related to not being able to eat or drink.   5. Do not take Vitamins for 5 days before your procedure     -On the day of your procedure, come to the [facility] Admitting/Registration desk (First desk on the right) at the scheduled arrival time. You MUST have someone drive you home from your procedure. You must have a responsible adult with a valid driver's license who is on site throughout your entire procedure and who can stay with you for several hours after your procedure. You may not go home alone in a taxi, shuttle [place] or bus, as the drivers will not be responsible for you.  --If you have any questions please call me at the above contact

## 2016-06-13 ENCOUNTER — Encounter: Payer: Self-pay | Admitting: Hematology and Oncology

## 2016-06-19 ENCOUNTER — Other Ambulatory Visit: Payer: Self-pay | Admitting: Urology

## 2016-06-19 DIAGNOSIS — Q6211 Congenital occlusion of ureteropelvic junction: Secondary | ICD-10-CM

## 2016-06-21 ENCOUNTER — Encounter: Payer: Self-pay | Admitting: *Deleted

## 2016-06-21 ENCOUNTER — Encounter
Admission: RE | Disposition: A | Discharge status: Home / Self Care | Payer: Self-pay | Source: Ambulatory Visit | Attending: Gastroenterology

## 2016-06-21 ENCOUNTER — Ambulatory Visit: Payer: 59 | Admitting: Certified Registered Nurse Anesthetist

## 2016-06-21 ENCOUNTER — Ambulatory Visit
Admission: RE | Admit: 2016-06-21 | Discharge: 2016-06-21 | Disposition: A | Discharge status: Home / Self Care | Payer: 59 | Source: Ambulatory Visit | Attending: Gastroenterology | Admitting: Gastroenterology

## 2016-06-21 DIAGNOSIS — C480 Malignant neoplasm of retroperitoneum: Secondary | ICD-10-CM | POA: Insufficient documentation

## 2016-06-21 DIAGNOSIS — J449 Chronic obstructive pulmonary disease, unspecified: Secondary | ICD-10-CM | POA: Insufficient documentation

## 2016-06-21 DIAGNOSIS — B1920 Unspecified viral hepatitis C without hepatic coma: Secondary | ICD-10-CM | POA: Diagnosis not present

## 2016-06-21 DIAGNOSIS — M797 Fibromyalgia: Secondary | ICD-10-CM | POA: Diagnosis not present

## 2016-06-21 DIAGNOSIS — K219 Gastro-esophageal reflux disease without esophagitis: Secondary | ICD-10-CM | POA: Insufficient documentation

## 2016-06-21 DIAGNOSIS — M1990 Unspecified osteoarthritis, unspecified site: Secondary | ICD-10-CM | POA: Insufficient documentation

## 2016-06-21 DIAGNOSIS — K644 Residual hemorrhoidal skin tags: Secondary | ICD-10-CM | POA: Insufficient documentation

## 2016-06-21 DIAGNOSIS — Z79899 Other long term (current) drug therapy: Secondary | ICD-10-CM | POA: Diagnosis not present

## 2016-06-21 DIAGNOSIS — C661 Malignant neoplasm of right ureter: Secondary | ICD-10-CM | POA: Insufficient documentation

## 2016-06-21 HISTORY — DX: Cardiac arrhythmia, unspecified: I49.9

## 2016-06-21 HISTORY — DX: Unspecified asthma, uncomplicated: J45.909

## 2016-06-21 HISTORY — PX: EUS: SHX5427

## 2016-06-21 HISTORY — DX: Chronic obstructive pulmonary disease, unspecified: J44.9

## 2016-06-21 HISTORY — DX: Gastro-esophageal reflux disease without esophagitis: K21.9

## 2016-06-21 SURGERY — ULTRASOUND, LOWER GI TRACT, ENDOSCOPIC
Anesthesia: General

## 2016-06-21 MED ORDER — SODIUM CHLORIDE 0.9 % IV SOLN
INTRAVENOUS | Status: DC
  Administered 2016-06-21: 1000 mL via INTRAVENOUS

## 2016-06-21 MED ORDER — FENTANYL CITRATE (PF) 100 MCG/2ML IJ SOLN: INTRAMUSCULAR | Status: DC | PRN

## 2016-06-21 MED ORDER — PROPOFOL 500 MG/50ML IV EMUL
INTRAVENOUS | Status: DC | PRN
  Administered 2016-06-21: 140 ug/kg/min via INTRAVENOUS

## 2016-06-21 MED ORDER — FENTANYL CITRATE (PF) 100 MCG/2ML IJ SOLN
INTRAMUSCULAR | Status: DC | PRN
  Administered 2016-06-21: 50 ug via INTRAVENOUS

## 2016-06-21 MED ORDER — PROPOFOL 10 MG/ML IV BOLUS
INTRAVENOUS | Status: DC | PRN
  Administered 2016-06-21: 20 mg via INTRAVENOUS
  Administered 2016-06-21: 50 mg via INTRAVENOUS

## 2016-06-21 MED ORDER — LIDOCAINE HCL (CARDIAC) 20 MG/ML IV SOLN
INTRAVENOUS | Status: DC | PRN
  Administered 2016-06-21: 60 mg via INTRAVENOUS

## 2016-06-21 MED ORDER — PHENYLEPHRINE HCL 10 MG/ML IJ SOLN
INTRAMUSCULAR | Status: DC | PRN
  Administered 2016-06-21: 100 ug via INTRAVENOUS

## 2016-06-21 NOTE — Anesthesia Preprocedure Evaluation (Signed)
Anesthesia Evaluation  Patient identified by MRN, date of birth, ID band Patient awake    Reviewed: Allergy & Precautions, NPO status , Patient's Chart, lab work & pertinent test results  Airway Mallampati: II       Dental  (+) Upper Dentures, Poor Dentition   Pulmonary COPD,  COPD inhaler,     + decreased breath sounds      Cardiovascular Exercise Tolerance: Good + dysrhythmias  Rhythm:Regular Rate:Normal     Neuro/Psych Anxiety Depression negative neurological ROS     GI/Hepatic GERD  Medicated,(+) Hepatitis -, C  Endo/Other    Renal/GU      Musculoskeletal  (+) Fibromyalgia -  Abdominal Normal abdominal exam  (+)   Peds  Hematology   Anesthesia Other Findings   Reproductive/Obstetrics                             Anesthesia Physical Anesthesia Plan  ASA: III  Anesthesia Plan: General   Post-op Pain Management:    Induction: Intravenous  Airway Management Planned: Natural Airway and Nasal Cannula  Additional Equipment:   Intra-op Plan:   Post-operative Plan:   Informed Consent: I have reviewed the patients History and Physical, chart, labs and discussed the procedure including the risks, benefits and alternatives for the proposed anesthesia with the patient or authorized representative who has indicated his/her understanding and acceptance.     Plan Discussed with: CRNA  Anesthesia Plan Comments:         Anesthesia Quick Evaluation

## 2016-06-21 NOTE — H&P (Signed)
Primary Care Physician:  Lynnell Jude, MD   Pre-Procedure History & Physical: HPI:  Sandra Landry is a 65 y.o. female is here for an Rectal endoscopic ultrasound.   Past Medical History:  Diagnosis Date  . Anxiety   . Arthritis   . Asthma    mild  . Cervical disc disease   . COPD (chronic obstructive pulmonary disease) (HCC)    mild  . Depression   . Dysrhythmia    tachycardia  . Fibromyalgia   . GERD (gastroesophageal reflux disease)   . Hepatitis C     Past Surgical History:  Procedure Laterality Date  . cervical discectomy with fusion    . COLONOSCOPY  01/2016    Prior to Admission medications   Medication Sig Start Date End Date Taking? Authorizing Provider  baclofen (LIORESAL) 20 MG tablet Take 20 mg by mouth 4 (four) times daily as needed for muscle spasms.  04/09/16  Yes Historical Provider, MD  cetirizine (ZYRTEC) 10 MG tablet Take 10 mg by mouth daily.  06/25/15  Yes Historical Provider, MD  diltiazem (CARDIZEM CD) 120 MG 24 hr capsule Take 120 mg by mouth daily.    Yes Historical Provider, MD  diphenhydrAMINE (BENADRYL) 25 MG tablet Take 25-50 mg by mouth every 6 (six) hours as needed.   Yes Historical Provider, MD  escitalopram (LEXAPRO) 20 MG tablet Take 20 mg by mouth daily.  04/26/16  Yes Historical Provider, MD  ibuprofen (ADVIL,MOTRIN) 200 MG tablet Take 200-600 mg by mouth every 6 (six) hours as needed for mild pain or moderate pain.   Yes Historical Provider, MD  lactulose (CHRONULAC) 10 GM/15ML solution  06/07/16  Yes Historical Provider, MD  oxyCODONE-acetaminophen (PERCOCET/ROXICET) 5-325 MG tablet  06/05/16  Yes Historical Provider, MD  polyethylene glycol (MIRALAX / GLYCOLAX) packet Take 17 g by mouth daily as needed.  05/07/16  Yes Historical Provider, MD  potassium chloride (K-DUR) 10 MEQ tablet Take 2 tablets (20 mEq total) by mouth daily. 06/01/16 06/21/16 Yes Merlyn Lot, MD  prochlorperazine (COMPAZINE) 5 MG/ML injection Inject 5-10 mg into  the vein as directed. Every 4 to 6 hours as needed.   Yes Historical Provider, MD  promethazine (PHENERGAN) 25 MG suppository Place 25 mg rectally as directed. Every 4 to 6 hours as needed 02/20/16  Yes Historical Provider, MD  ranitidine (ZANTAC) 300 MG tablet Take 300 mg by mouth daily.  03/08/15  Yes Historical Provider, MD  traMADol (ULTRAM) 50 MG tablet Take 50-100 mg by mouth every 4 (four) hours as needed.    Yes Historical Provider, MD  gabapentin (NEURONTIN) 300 MG capsule Take 300 mg by mouth 2 (two) times daily. 06/05/16   Historical Provider, MD    Allergies as of 06/12/2016 - Review Complete 06/11/2016  Allergen Reaction Noted  . Other Swelling 03/23/2015  . Iodides  11/06/2014  . Tetanus toxoids Other (See Comments) 05/25/2016  . Iodine Other (See Comments) and Rash     History reviewed. No pertinent family history.  Social History   Social History  . Marital status: Single    Spouse name: N/A  . Number of children: N/A  . Years of education: N/A   Occupational History  . Not on file.   Social History Main Topics  . Smoking status: Never Smoker  . Smokeless tobacco: Never Used  . Alcohol use No  . Drug use:     Types: Marijuana  . Sexual activity: Not on file   Other Topics  Concern  . Not on file   Social History Narrative  . No narrative on file    Review of Systems: See HPI, otherwise negative ROS  Physical Exam: BP 115/68   Pulse 86   Temp 98.6 F (37 C) (Tympanic)   Resp 18   SpO2 98%  General:   Alert,  pleasant and cooperative in NAD Head:  Normocephalic and atraumatic. Neck:  Supple; no masses or thyromegaly. Lungs:  Clear throughout to auscultation.    Heart:  Regular rate and rhythm. Abdomen:  Soft, minimally tender in the RLQ. Neurologic:  Alert and  oriented x4;  grossly normal neurologically.  Impression/Plan: Sandra Landry is here for an Rectal endoscopic ultrasound to be performed for a retroperitoneal mass seen on CT  scan.  Risks, benefits, limitations, and alternatives regarding  Rectal Endoscopic Ultrasound have been reviewed with the patient.  Questions have been answered.  All parties agreeable.   Reita Cliche, MD  06/21/2016, 2:07 PM

## 2016-06-21 NOTE — Transfer of Care (Signed)
Immediate Anesthesia Transfer of Care Note  Patient: Sandra Landry  Procedure(s) Performed: Procedure(s): LOWER ENDOSCOPIC ULTRASOUND (EUS) (N/A)  Patient Location: PACU  Anesthesia Type:General  Level of Consciousness: sedated  Airway & Oxygen Therapy: Patient Spontanous Breathing and Patient connected to nasal cannula oxygen  Post-op Assessment: Report given to RN and Post -op Vital signs reviewed and stable  Post vital signs: Reviewed and stable  Last Vitals:  Vitals:   06/21/16 1340  BP: 115/68  Pulse: 86  Resp: 18  Temp: 37 C    Last Pain:  Vitals:   06/21/16 1340  TempSrc: Tympanic  PainSc: 8          Complications: No apparent anesthesia complications

## 2016-06-21 NOTE — Op Note (Signed)
[facility] Gastroenterology Patient Name: Sandra Landry Procedure Date: 06/21/2016 2:14 PM MRN: [id number] Account #: [id number] Date of Birth: 11-30-1950 Admit Type: Outpatient Age: 65 Room: [facility] ENDO ROOM 3 Gender: Female Note Status: Finalized Procedure:            Lower EUS Indications:          Suspected mass seen on abdominal/pelvic CT scan,                        Abnormal PET scan of the pelvis; mass abutting the                        rectosigmoid colon. Patient Profile:      Refer to note in patient chart for documentation of                        history and physical. Providers:            Lenetta Quaker. Cephas Darby, MD Referring MD:         Lynnell Jude (Referring MD), Drue Second. Mike Gip                        (Referring MD) Medicines:            Monitored Anesthesia Care Complications:        No immediate complications. Procedure:            Pre-Anesthesia Assessment:                       - Monitored anesthesia care under the supervision of an                        anesthetist was determined to be medically necessary                        for this procedure based on review of the patient's                        medical history, medications, and prior anesthesia                        history.                       After obtaining informed consent, the endoscope was                        passed under direct vision. Throughout the procedure,                        the patient's blood pressure, pulse, and oxygen                        saturations were monitored continuously. The EUS GI                        Radial Array [id number] was introduced through the anus                        and advanced to the the sigmoid colon for ultrasound. Findings:  The perianal and digital rectal examinations were normal.      Endoscopic Finding :      An infiltrative non-obstructing large mass was found in the       recto-sigmoid colon from 14 cm to 18cm from the  anal verge. The mass was       non-circumferential. The mass measured four cm in length. No bleeding       was present. Biopsies were taken with a cold forceps for histology.      External hemorrhoids were found during retroflexion. The hemorrhoids       were small.      Endosonographic Finding :      A hypoechoic mass was found at the rectosigmoid junction extending from       approximately 14 cm to 18 cm from the anal verge. The mass was       non-circumferential and located predominantly at the posterior bowel       wall. The endosonographic borders were irregular. The mass measured       approximately 56.2 mm (in maximum length) by 45.4 mm (in maximum width).       The mass appeared to arise from the pelvic space and there was       sonographic evidence suggesting that the mass invaded into the colonic       mucosa layer 1.      No abnormal-appearing lymph nodes were seen in the perirectal region and       near the colon mass during endosonographic examination of the rectum and       sigmoid colon. The iliac vessels were not visualized. 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                        Biopsied.                       - External hemorrhoids.                       EUS Impression:                       - A mass was visualized endosonographically at the                        rectosigmoid junction. As the mass appared to invade                        into the colon lumen FNB was not performed. Should                        these biopsies return without significant pathology                        this lesion is amenable to EUS biopsy.                       - No abnormal-appearing lymph nodes were seen in the  perirectal region and near the colon mass during                        endosonographic examination of the rectum and sigmoid                        colon. The iliac  vessels were not visualized. Recommendation:       - Discharge patient to home (via wheelchair).                       - Await path results.                       - Return to referring physician as previously scheduled.                       - The findings and recommendations were discussed with                        the patient and their family. Dr. Lenetta Quaker. Cephas Darby, MD Lenetta Quaker. Lilliemae Fruge, MD 06/21/2016 3:03:38 PM This report has been signed electronically. Number of Addenda: 0 Note Initiated On: 06/21/2016 2:14 PM Total Procedure Duration: 0 hours 18 minutes 8 seconds  Estimated Blood Loss: Estimated blood loss was minimal.      [facility]

## 2016-06-21 NOTE — Anesthesia Procedure Notes (Signed)
Performed by: Estelle Greenleaf Pre-anesthesia Checklist: Patient identified, Emergency Drugs available, Suction available, Patient being monitored and Timeout performed Patient Re-evaluated:Patient Re-evaluated prior to inductionOxygen Delivery Method: Nasal cannula Intubation Type: IV induction       

## 2016-06-22 ENCOUNTER — Encounter: Payer: Self-pay | Admitting: Gastroenterology

## 2016-06-27 LAB — SURGICAL PATHOLOGY

## 2016-06-27 NOTE — Anesthesia Postprocedure Evaluation (Signed)
Anesthesia Post Note  Patient: Sandra Landry  Procedure(s) Performed: Procedure(s) (LRB): LOWER ENDOSCOPIC ULTRASOUND (EUS) (N/A)  Patient location during evaluation: PACU Anesthesia Type: General Level of consciousness: awake Pain management: pain level controlled Vital Signs Assessment: post-procedure vital signs reviewed and stable Respiratory status: spontaneous breathing Cardiovascular status: blood pressure returned to baseline Anesthetic complications: no    Last Vitals:  Vitals:   06/21/16 1515 06/21/16 1525  BP: 131/67 118/76  Pulse: 80 81  Resp: 20 18  Temp:      Last Pain:  Vitals:   06/21/16 1455  TempSrc: Tympanic  PainSc:                  VAN STAVEREN,Jessina Marse

## 2016-06-29 ENCOUNTER — Other Ambulatory Visit: Payer: Self-pay | Admitting: Hematology and Oncology

## 2016-06-29 ENCOUNTER — Telehealth: Payer: Self-pay | Admitting: Urology

## 2016-06-29 ENCOUNTER — Inpatient Hospital Stay: Payer: 59 | Attending: Hematology and Oncology | Admitting: Hematology and Oncology

## 2016-06-29 DIAGNOSIS — C785 Secondary malignant neoplasm of large intestine and rectum: Secondary | ICD-10-CM | POA: Diagnosis not present

## 2016-06-29 DIAGNOSIS — R918 Other nonspecific abnormal finding of lung field: Secondary | ICD-10-CM | POA: Diagnosis not present

## 2016-06-29 DIAGNOSIS — C669 Malignant neoplasm of unspecified ureter: Secondary | ICD-10-CM

## 2016-06-29 DIAGNOSIS — K219 Gastro-esophageal reflux disease without esophagitis: Secondary | ICD-10-CM | POA: Insufficient documentation

## 2016-06-29 DIAGNOSIS — M797 Fibromyalgia: Secondary | ICD-10-CM | POA: Diagnosis not present

## 2016-06-29 DIAGNOSIS — K5900 Constipation, unspecified: Secondary | ICD-10-CM | POA: Insufficient documentation

## 2016-06-29 DIAGNOSIS — R000 Tachycardia, unspecified: Secondary | ICD-10-CM | POA: Diagnosis not present

## 2016-06-29 DIAGNOSIS — M129 Arthropathy, unspecified: Secondary | ICD-10-CM | POA: Insufficient documentation

## 2016-06-29 DIAGNOSIS — F419 Anxiety disorder, unspecified: Secondary | ICD-10-CM

## 2016-06-29 DIAGNOSIS — M545 Low back pain: Secondary | ICD-10-CM | POA: Diagnosis not present

## 2016-06-29 DIAGNOSIS — J45909 Unspecified asthma, uncomplicated: Secondary | ICD-10-CM | POA: Insufficient documentation

## 2016-06-29 DIAGNOSIS — C661 Malignant neoplasm of right ureter: Secondary | ICD-10-CM | POA: Insufficient documentation

## 2016-06-29 DIAGNOSIS — Z79899 Other long term (current) drug therapy: Secondary | ICD-10-CM | POA: Diagnosis not present

## 2016-06-29 DIAGNOSIS — R634 Abnormal weight loss: Secondary | ICD-10-CM

## 2016-06-29 DIAGNOSIS — M5030 Other cervical disc degeneration, unspecified cervical region: Secondary | ICD-10-CM | POA: Diagnosis not present

## 2016-06-29 DIAGNOSIS — Z8619 Personal history of other infectious and parasitic diseases: Secondary | ICD-10-CM | POA: Diagnosis not present

## 2016-06-29 NOTE — Progress Notes (Signed)
Patient on plan of care prior to pathways. 

## 2016-06-29 NOTE — Progress Notes (Signed)
Patient not sleeping well.  Wakes up a lot during the night to go to BR due to pressure.  Also states she is eating one good meal a day and the rest of the times just nibbles.  Gets SOB when up to BR and then starts couging and gets nauseated.  BP low this morning.  Sitting 98/65 HR 64  Standing 86/55 HR 65.

## 2016-06-29 NOTE — Telephone Encounter (Addendum)
Case was discussed with Dr. Mike Gip today and also pathology and imaging reviewed yesterday at [facility] tumor board. 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  T4 uppertract TCC, possible pulmonary nodule.    It appears the patient has been following with with Dr. Thurmond Butts at [facility] urology. I discussed the case with him today by telephone today.    Mrs. 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 As such, we discussed the possibility of left percutaneous nephrostomy tube to help optimize function.  He is agreed to reach out to the patient and see if should like to follow-up with him in [facility] for nephrostomy tube placement versus here in [place].  Hollice Espy, MD

## 2016-06-29 NOTE — Patient Instructions (Signed)
Gemcitabine injection  What is this medicine?  GEMCITABINE (jem SIT a been) is a chemotherapy drug. This medicine is used to treat many types of cancer like breast cancer, lung cancer, pancreatic cancer, and ovarian cancer.  This medicine may be used for other purposes; ask your health care provider or pharmacist if you have questions.  What should I tell my health care provider before I take this medicine?  They need to know if you have any of these conditions:  -blood disorders  -infection  -kidney disease  -liver disease  -recent or ongoing radiation therapy  -an unusual or allergic reaction to gemcitabine, other chemotherapy, other medicines, foods, dyes, or preservatives  -pregnant or trying to get pregnant  -breast-feeding  How should I use this medicine?  This drug is given as an infusion into a vein. It is administered in a hospital or clinic by a specially trained health care professional.  Talk to your pediatrician regarding the use of this medicine in children. Special care may be needed.  Overdosage: If you think you have taken too much of this medicine contact a poison control center or emergency room at once.  NOTE: This medicine is only for you. Do not share this medicine with others.  What if I miss a dose?  It is important not to miss your dose. Call your doctor or health care professional if you are unable to keep an appointment.  What may interact with this medicine?  -medicines to increase blood counts like filgrastim, pegfilgrastim, sargramostim  -some other chemotherapy drugs like cisplatin  -vaccines  Talk to your doctor or health care professional before taking any of these medicines:  -acetaminophen  -aspirin  -ibuprofen  -ketoprofen  -naproxen  This list may not describe all possible interactions. Give your health care provider a list of all the medicines, herbs, non-prescription drugs, or dietary supplements you use. Also tell them if you smoke, drink alcohol, or use illegal drugs. Some  items may interact with your medicine.  What should I watch for while using this medicine?  Visit your doctor for checks on your progress. This drug may make you feel generally unwell. This is not uncommon, as chemotherapy can affect healthy cells as well as cancer cells. Report any side effects. Continue your course of treatment even though you feel ill unless your doctor tells you to stop.  In some cases, you may be given additional medicines to help with side effects. Follow all directions for their use.  Call your doctor or health care professional for advice if you get a fever, chills or sore throat, or other symptoms of a cold or flu. Do not treat yourself. This drug decreases your body's ability to fight infections. Try to avoid being around people who are sick.  This medicine may increase your risk to bruise or bleed. Call your doctor or health care professional if you notice any unusual bleeding.  Be careful brushing and flossing your teeth or using a toothpick because you may get an infection or bleed more easily. If you have any dental work done, tell your dentist you are receiving this medicine.  Avoid taking products that contain aspirin, acetaminophen, ibuprofen, naproxen, or ketoprofen unless instructed by your doctor. These medicines may hide a fever.  Women should inform their doctor if they wish to become pregnant or think they might be pregnant. There is a potential for serious side effects to an unborn child. Talk to your health care professional or pharmacist for   reactions like skin rash, itching or hives, swelling of the face, lips, or tongue -low blood counts - this medicine may decrease the number of white blood cells, red blood cells and  platelets. You may be at increased risk for infections and bleeding. -signs of infection - fever or chills, cough, sore throat, pain or difficulty passing urine -signs of decreased platelets or bleeding - bruising, pinpoint red spots on the skin, black, tarry stools, blood in the urine -signs of decreased red blood cells - unusually weak or tired, fainting spells, lightheadedness -breathing problems -chest pain -mouth sores -nausea and vomiting -pain, swelling, redness at site where injected -pain, tingling, numbness in the hands or feet -stomach pain -swelling of ankles, feet, hands -unusual bleeding Side effects that usually do not require medical attention (report to your doctor or health care professional if they continue or are bothersome): -constipation -diarrhea -hair loss -loss of appetite -stomach upset This list may not describe all possible side effects. Call your doctor for medical advice about side effects. You may report side effects to FDA at 1-800-FDA-1088. Where should I keep my medicine? This drug is given in a hospital or clinic and will not be stored at home. NOTE: This sheet is a summary. It may not cover all possible information. If you have questions about this medicine, talk to your doctor, pharmacist, or health care provider.    2016, [employer]. (2008-01-13 18:45:54) Carboplatin injection What is this medicine? CARBOPLATIN (KAR boe pla tin) is a chemotherapy drug. It targets fast dividing cells, like cancer cells, and causes these cells to die. This medicine is used to treat ovarian cancer and many other cancers. This medicine may be used for other purposes; ask your health care provider or pharmacist if you have questions. What should I tell my health care provider before I take this medicine? They need to know if you have any of these conditions: -blood disorders -hearing problems -kidney disease -recent or ongoing radiation therapy -an  unusual or allergic reaction to carboplatin, cisplatin, other chemotherapy, other medicines, foods, dyes, or preservatives -pregnant or trying to get pregnant -breast-feeding How should I use this medicine? This drug is usually given as an infusion into a vein. It is administered in a hospital or clinic by a specially trained health care professional. Talk to your pediatrician regarding the use of this medicine in children. Special care may be needed. Overdosage: If you think you have taken too much of this medicine contact a poison control center or emergency room at once. NOTE: This medicine is only for you. Do not share this medicine with others. What if I miss a dose? It is important not to miss a dose. Call your doctor or health care professional if you are unable to keep an appointment. What may interact with this medicine? -medicines for seizures -medicines to increase blood counts like filgrastim, pegfilgrastim, sargramostim -some antibiotics like amikacin, gentamicin, neomycin, streptomycin, tobramycin -vaccines Talk to your doctor or health care professional before taking any of these medicines: -acetaminophen -aspirin -ibuprofen -ketoprofen -naproxen This list may not describe all possible interactions. Give your health care provider a list of all the medicines, herbs, non-prescription drugs, or dietary supplements you use. Also tell them if you smoke, drink alcohol, or use illegal drugs. Some items may interact with your medicine. What should I watch for while using this medicine? Your condition will be monitored carefully while you are receiving this medicine. You will need important blood work done while you  are taking this medicine. This drug may make you feel generally unwell. This is not uncommon, as chemotherapy can affect healthy cells as well as cancer cells. Report any side effects. Continue your course of treatment even though you feel ill unless your doctor tells you to  stop. In some cases, you may be given additional medicines to help with side effects. Follow all directions for their use. Call your doctor or health care professional for advice if you get a fever, chills or sore throat, or other symptoms of a cold or flu. Do not treat yourself. This drug decreases your body's ability to fight infections. Try to avoid being around people who are sick. This medicine may increase your risk to bruise or bleed. Call your doctor or health care professional if you notice any unusual bleeding. Be careful brushing and flossing your teeth or using a toothpick because you may get an infection or bleed more easily. If you have any dental work done, tell your dentist you are receiving this medicine. Avoid taking products that contain aspirin, acetaminophen, ibuprofen, naproxen, or ketoprofen unless instructed by your doctor. These medicines may hide a fever. Do not become pregnant while taking this medicine. Women should inform their doctor if they wish to become pregnant or think they might be pregnant. There is a potential for serious side effects to an unborn child. Talk to your health care professional or pharmacist for more information. Do not breast-feed an infant while taking this medicine. What side effects may I notice from receiving this medicine? Side effects that you should report to your doctor or health care professional as soon as possible: -allergic reactions like skin rash, itching or hives, swelling of the face, lips, or tongue -signs of infection - fever or chills, cough, sore throat, pain or difficulty passing urine -signs of decreased platelets or bleeding - bruising, pinpoint red spots on the skin, black, tarry stools, nosebleeds -signs of decreased red blood cells - unusually weak or tired, fainting spells, lightheadedness -breathing problems -changes in hearing -changes in vision -chest pain -high blood pressure -low blood counts - This drug may  decrease the number of white blood cells, red blood cells and platelets. You may be at increased risk for infections and bleeding. -nausea and vomiting -pain, swelling, redness or irritation at the injection site -pain, tingling, numbness in the hands or feet -problems with balance, talking, walking -trouble passing urine or change in the amount of urine Side effects that usually do not require medical attention (report to your doctor or health care professional if they continue or are bothersome): -hair loss -loss of appetite -metallic taste in the mouth or changes in taste This list may not describe all possible side effects. Call your doctor for medical advice about side effects. You may report side effects to FDA at 1-800-FDA-1088. Where should I keep my medicine? This drug is given in a hospital or clinic and will not be stored at home. NOTE: This sheet is a summary. It may not cover all possible information. If you have questions about this medicine, talk to your doctor, pharmacist, or health care provider.    2016, [employer]. (2007-12-09 14:38:05) Cisplatin injection What is this medicine? CISPLATIN (SIS pla tin) is a chemotherapy drug. It targets fast dividing cells, like cancer cells, and causes these cells to die. This medicine is used to treat many types of cancer like bladder, ovarian, and testicular cancers. This medicine may be used for other purposes; ask your health  care provider or pharmacist if you have questions. What should I tell my health care provider before I take this medicine? They need to know if you have any of these conditions: -blood disorders -hearing problems -kidney disease -recent or ongoing radiation therapy -an unusual or allergic reaction to cisplatin, carboplatin, other chemotherapy, other medicines, foods, dyes, or preservatives -pregnant or trying to get pregnant -breast-feeding How should I use this medicine? This drug is given as  an infusion into a vein. It is administered in a hospital or clinic by a specially trained health care professional. Talk to your pediatrician regarding the use of this medicine in children. Special care may be needed. Overdosage: If you think you have taken too much of this medicine contact a poison control center or emergency room at once. NOTE: This medicine is only for you. Do not share this medicine with others. What if I miss a dose? It is important not to miss a dose. Call your doctor or health care professional if you are unable to keep an appointment. What may interact with this medicine? -dofetilide -foscarnet -medicines for seizures -medicines to increase blood counts like filgrastim, pegfilgrastim, sargramostim -probenecid -pyridoxine used with altretamine -rituximab -some antibiotics like amikacin, gentamicin, neomycin, polymyxin B, streptomycin, tobramycin -sulfinpyrazone -vaccines -zalcitabine Talk to your doctor or health care professional before taking any of these medicines: -acetaminophen -aspirin -ibuprofen -ketoprofen -naproxen This list may not describe all possible interactions. Give your health care provider a list of all the medicines, herbs, non-prescription drugs, or dietary supplements you use. Also tell them if you smoke, drink alcohol, or use illegal drugs. Some items may interact with your medicine. What should I watch for while using this medicine? Your condition will be monitored carefully while you are receiving this medicine. You will need important blood work done while you are taking this medicine. This drug may make you feel generally unwell. This is not uncommon, as chemotherapy can affect healthy cells as well as cancer cells. Report any side effects. Continue your course of treatment even though you feel ill unless your doctor tells you to stop. In some cases, you may be given additional medicines to help with side effects. Follow all directions  for their use. Call your doctor or health care professional for advice if you get a fever, chills or sore throat, or other symptoms of a cold or flu. Do not treat yourself. This drug decreases your body's ability to fight infections. Try to avoid being around people who are sick. This medicine may increase your risk to bruise or bleed. Call your doctor or health care professional if you notice any unusual bleeding. Be careful brushing and flossing your teeth or using a toothpick because you may get an infection or bleed more easily. If you have any dental work done, tell your dentist you are receiving this medicine. Avoid taking products that contain aspirin, acetaminophen, ibuprofen, naproxen, or ketoprofen unless instructed by your doctor. These medicines may hide a fever. Do not become pregnant while taking this medicine. Women should inform their doctor if they wish to become pregnant or think they might be pregnant. There is a potential for serious side effects to an unborn child. Talk to your health care professional or pharmacist for more information. Do not breast-feed an infant while taking this medicine. Drink fluids as directed while you are taking this medicine. This will help protect your kidneys. Call your doctor or health care professional if you get diarrhea. Do not treat yourself.  What side effects may I notice from receiving this medicine? Side effects that you should report to your doctor or health care professional as soon as possible: -allergic reactions like skin rash, itching or hives, swelling of the face, lips, or tongue -signs of infection - fever or chills, cough, sore throat, pain or difficulty passing urine -signs of decreased platelets or bleeding - bruising, pinpoint red spots on the skin, black, tarry stools, nosebleeds -signs of decreased red blood cells - unusually weak or tired, fainting spells, lightheadedness -breathing problems -changes in hearing -gout  pain -low blood counts - This drug may decrease the number of white blood cells, red blood cells and platelets. You may be at increased risk for infections and bleeding. -nausea and vomiting -pain, swelling, redness or irritation at the injection site -pain, tingling, numbness in the hands or feet -problems with balance, movement -trouble passing urine or change in the amount of urine Side effects that usually do not require medical attention (report to your doctor or health care professional if they continue or are bothersome): -changes in vision -loss of appetite -metallic taste in the mouth or changes in taste This list may not describe all possible side effects. Call your doctor for medical advice about side effects. You may report side effects to FDA at 1-800-FDA-1088. Where should I keep my medicine? This drug is given in a hospital or clinic and will not be stored at home. NOTE: This sheet is a summary. It may not cover all possible information. If you have questions about this medicine, talk to your doctor, pharmacist, or health care provider.    2016, [employer] Standard. (2007-12-09 14:40:54)

## 2016-06-29 NOTE — Progress Notes (Signed)
START ON PATHWAY REGIMEN - Bladder  [id number]: Gemcitabine 1,000 mg/m2 D1, 8 + Cisplatin 70 mg/m2 D1 q21 Days for a Maximum of 6 Cycles   A cycle is every 21 days:     Gemcitabine (Gemzar(R)) 1000 mg/m2 in 250 mL NS IV over 30 minutes days 1 and 8. Dose Mod: None     Cisplatin (Platinol(R)) 70 mg/m2 in 500 mL NS IV. **Prehydrate and consider post-hydration.***  Consider RX for 3 days of bid oral dexamethasone for delayed nausea. Dose Mod: None  **Always confirm dose/schedule in your pharmacy ordering system**    Patient Characteristics: Metastatic Disease, First Line, No Prior Neoadjuvant/Adjuvant Therapy AJCC Stage Grouping: IV Current evidence of distant metastases? Yes AJCC T Stage: X AJCC M Stage: X AJCC N Stage: X Line of therapy: First Line Would you be surprised if this patient died  in the next year? I would be surprised if this patient died in the next year Prior Neoadjuvant/Adjuvant Therapy? No  Intent of Therapy: Non-Curative / Palliative Intent, Discussed with Patient

## 2016-06-29 NOTE — Progress Notes (Addendum)
[facility] day:  06/29/2016  Chief Complaint: Sandra Landry is a 65 y.o. female with a retroperitoneal mass who is seen for review of interval biopsy.  HPI: The patient was last seen in the medical oncology clinic on 06/11/2016.  At that time, interval PET scan was reviewed.  Scan revealed an isolated hypermetabolic mass intimately associated with the rectosigmoid colon.  We discussed the inability to perform a CT guided biopsy.  We discussed laparoscopic biopsy or endoscopic biopsy with ultrasound through the colon.  She underwent lower endoscopic ultrasound on 06/21/2016.  Imaging revealed an infiltrative 5.6 x 4.5 cm non-obstructing large mass in the recto-sigmoid colon extendiing from 14 cm to 18 cm from the anal verge. The mass was non-circumferential.  Biopsies were taken with a cold forceps.  Pathology revealed a urothelial carcinoma.  She has been seen by Dr. Kathrin Ruddy, urologist at [facility].   She is being scheduled for a lasix renal scan to assess kidney function and the potential benefit of right ureteral stent placement for chronic obstruction of the right kidney.  Symptomatically she notes some ongoing mild difficulty with urination and bowel movements. She has some low back pain on her right side.  Pain is currently being managed by Dr Clemmie Krill with Tramadol and prn oxycodone.   Past Medical History:  Diagnosis Date  . Anxiety   . Arthritis   . Asthma    mild  . Cervical disc disease   . COPD (chronic obstructive pulmonary disease) (HCC)    mild  . Depression   . Dysrhythmia    tachycardia  . Fibromyalgia   . GERD (gastroesophageal reflux disease)   . Hepatitis C     Past Surgical History:  Procedure Laterality Date  . cervical discectomy with fusion    . COLONOSCOPY  01/2016  . EUS N/A 06/21/2016   Procedure: LOWER ENDOSCOPIC ULTRASOUND (EUS);  Surgeon: Reita Cliche, MD;  Location: [facility];  Service: Endoscopy;   Laterality: N/A;    No family history on file.  Social History:  reports that she has never smoked. She has never used smokeless tobacco. She reports that she uses drugs, including Marijuana. She reports that she does not drink alcohol.  She lives in [place].  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  She has also worked in the Physicist, medical.  The patient is accompanied by her partner, Darliss Ridgel, a nurse, today.  Allergies:  Allergies  Allergen Reactions  . Other Swelling    Dust, ragweed  . Iodides   . Tetanus Toxoids Other (See Comments)    Localized pain and swelling  . Iodine Other (See Comments) and Rash    Limited history. Occurred in 70s, at age 66-12, received contrast at public health facility in [place], while being evaluated for meningitis. Immediate rash - not itchy, "flat, red, perfect circles" over entire body. Has not received iodine contrast since.    Current Medications: Current Outpatient Prescriptions  Medication Sig Dispense Refill  . baclofen (LIORESAL) 20 MG tablet Take 20 mg by mouth 4 (four) times daily as needed for muscle spasms.     . cetirizine (ZYRTEC) 10 MG tablet Take 10 mg by mouth daily.     Marland Kitchen diltiazem (CARDIZEM CD) 120 MG 24 hr capsule Take 120 mg by mouth daily.     . diphenhydrAMINE (BENADRYL) 25 MG tablet Take 25-50 mg by mouth every 6 (six) hours as needed.    Marland Kitchen  escitalopram (LEXAPRO) 20 MG tablet Take 20 mg by mouth daily.     Marland Kitchen ibuprofen (ADVIL,MOTRIN) 200 MG tablet Take 200-600 mg by mouth every 6 (six) hours as needed for mild pain or moderate pain.    Marland Kitchen lactulose (CHRONULAC) 10 GM/15ML solution     . oxyCODONE-acetaminophen (PERCOCET/ROXICET) 5-325 MG tablet     . polyethylene glycol (MIRALAX / GLYCOLAX) packet Take 17 g by mouth daily as needed.     . prochlorperazine (COMPAZINE) 5 MG/ML injection Inject 5-10 mg into the vein as directed. Every 4 to 6 hours as needed.    . promethazine (PHENERGAN) 25 MG suppository Place 25 mg  rectally as directed. Every 4 to 6 hours as needed    . ranitidine (ZANTAC) 300 MG tablet Take 300 mg by mouth daily.     . traMADol (ULTRAM) 50 MG tablet Take 50-100 mg by mouth every 4 (four) hours as needed.     . potassium chloride (K-DUR) 10 MEQ tablet Take 2 tablets (20 mEq total) by mouth daily. 28 tablet 0  . potassium chloride (MICRO-K) 10 MEQ CR capsule      No current facility-administered medications for this visit.     Review of Systems:  GENERAL:  Feels "the same".  No fevers or sweats.  Weight loss of 38 pounds in year (weight up 3 pounds since last visit). PERFORMANCE STATUS (ECOG):  2 HEENT:  No visual changes, runny nose, sore throat, mouth sores or tenderness. Lungs:  Shortness of breath with exertion.  No cough.  No hemoptysis. Cardiac:  No chest pain, palpitations, orthopnea, or PND. GI:  Pain in RLQ.  Appetite 25%.  Chronic nausea and vomiting.  Irritable bowel.  Constipation.  No diarrhea, melena or hematochezia. GU:  Slight decreased urine output.  No urgency, frequency, dysuria, or hematuria. Musculoskeletal:  No back pain.  No joint pain.  No muscle tenderness. Extremities:  No pain or swelling. Skin:  No rashes or skin changes. Neuro:  No headache, numbness or weakness, balance or coordination issues. Endocrine:  No diabetes, thyroid issues, hot flashes or night sweats. Psych:  Hypersensitive.  PTSD.  Depression.  Anxiety. Pain:  RLQ pain, well controlled. Review of systems:  All other systems reviewed and found to be negative.  Physical Exam: Blood pressure 94/62, pulse 65, temperature (!) 95.1 F (35.1 C), temperature source Tympanic, resp. rate 18, weight 136 lb 0.4 oz (61.7 kg). GENERAL:  Well developed, well nourished, sitting comfortably the exam room in mild/moderate distress. MENTAL STATUS:  Alert and oriented to person, place and time. HEAD:  Short dark hair with graying.  Normocephalic, atraumatic, face symmetric, no Cushingoid features. EYES:  No  conjunctivitis or scleral icterus. NEUROLOGICAL: Unremarkable. PSYCH:  Appropriate.   No visits with results within 3 Day(s) from this visit.  Latest known visit with results is:  Admission on 06/21/2016, Discharged on 06/21/2016  Component Date Value Ref Range Status  . SURGICAL PATHOLOGY 06/27/2016    Final                   Value:Surgical Pathology CASE: [id number] PATIENT: Sandra Landry Surgical Pathology Report     SPECIMEN SUBMITTED: A. Recto-sigmoid mass; cbx  CLINICAL HISTORY: None provided  PRE-OPERATIVE DIAGNOSIS: Right pelvic retroperitoneal mass.  Needs biopsy  POST-OPERATIVE DIAGNOSIS: Invasive peri-rectal mass     DIAGNOSIS: A. RECTOSIGMOID MASS; COLD BIOPSY: - UROTHELIAL CARCINOMA UNDERMINING COLONIC MUCOSA.  Comment:  A limited panel of immunohistochemical stains was performed. The neoplastic  cells have the following immunoreactivity: Super pancytokeratin: Positive p40: Positive GATA-3: Positive SOX-1: Negative CD45: Negative Vimentin: Negative DOG-1: Negative PAX-8: Negative CDX-2: Negative p16: Negative (high background) These findings support the above diagnosis. These results were communicated to Dr. Mike Gip on 06/27/2016. Stain controls worked appropriately.  GROSS DESCRIPTION: A. Labeled: rectosigmoid mass C BX Tissue fragment(s): multiple S                         ize: aggregate, 0.8 x 0.6 x 0.1 cm Description: tan fragments  Entirely submitted in 1 cassette(s).  Final Diagnosis performed by Quay Burow, MD.  Electronically signed 06/27/2016 4:00:48PM    The electronic signature indicates that the named Attending Pathologist has evaluated the specimen  Technical component performed at [facility], [address] [place], [place] [address] Lab: [phone] Dir: Darrick Penna. Evette Doffing, MD  Professional component performed at [facility], [address] [facility], [place], [place], [place] [address] Lab:  [phone] Dir: Dellia Nims. Reuel Derby, MD      Assessment:  Sandra Landry is a 65 y.o. 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  She has a history of lower abdomen for 2 years felt secondary to irritable bowel syndrome. For the past 3 months she is had RLQ pain which has extended to her right buttock and hip.  She has lost 38 pounds in the past year.  Lower endoscopic ultrasound on 06/21/2016 revealed an infiltrative 5.6 x 4.5 cm non-obstructing large mass in the recto-sigmoid colon extendiing from 14 cm to 18 cm from the anal verge. The mass was non-circumferential.  Biopsies revealed a urothelial carcinoma.  Pathologic stage was T4NxM0.  Abdominal and pelvic CT scan on 05/14/2016 revealed a 5.7 cm right lower quadrant retroperitoneal mass highly concerning for malignancy. The mass involved the sigmoid colon but is favored to reflect a primary retroperitoneal tumor (such as sarcoma) over a primary colonic mass.  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  There were multiple subcentimeter liver lesions, indeterminate.   PET scan on 06/06/2016 revealed hypermetabolism (SUV 19.7)  corresponding to the right lower quadrant retroperitoneal mass. It was felt intimately associated with the rectosigmoid colon, but originating outside of the colon. There was similar right-sided hydroureteronephrosis due to the right lower quadrant mass.  There was a 4 mm nodule in the left lower lobe (unclear significance).  Colonoscopy at [facility] on 07/18/2015 revealed 3 adenomatous polyps.  Symptomatically, she has RLQ pain well controlled with Tramadol and oxycodone prn.  She has some constipation and mild decreased urine output.  Plan: 1.  Review biopsy results.  Discuss locally advanced right ureteral cancer.  Clinical stage is felt to be T4NxM0.  PET scan reveals a small 4 mm nodule in the left lower lobe of unclear significance.  I discussed  plans for neoadjuvant chemotherapy followed by surgery.  Chemotherapy would consist of gemcitabine and cisplatin as long as her renal function is good.  We discussed the consideration of a stent placement on the right side to maximize her renal function for the use of cisplatin. The benefit of gemcitabine and cisplatin over gemcitabine and carboplatin was discussed.   We discussed follow-up with Dr. Thurmond Butts for possible stent placement.  He would also likely be performing her surgery after chemotherapy.  I discussed my conversation with Dr. Erlene Quan who will be talking to Dr. Thurmond Butts.  We discussed possible second opinion at [facility] from medical oncology.  She inquired about the small nodule in the left lower lobe.  At this point, it is unclear if she has metastatic disease.  The lesion may be benign and will be followed with future imaging.  Multiple questions were asked and answered.  2.  Patient to follow-up with Dr. Thurmond Butts at [facility]. 3.  Preauth gemcitabine and cisplatin. 4.  RTC after nephrostomy tube placement.   Lequita Asal, MD  06/29/2016

## 2016-06-30 ENCOUNTER — Encounter: Payer: Self-pay | Admitting: Hematology and Oncology

## 2016-07-02 ENCOUNTER — Encounter
Admission: RE | Admit: 2016-07-02 | Discharge: 2016-07-02 | Disposition: A | Discharge status: Home / Self Care | Payer: 59 | Source: Ambulatory Visit | Attending: Urology | Admitting: Urology

## 2016-07-02 DIAGNOSIS — Q620 Congenital hydronephrosis: Secondary | ICD-10-CM | POA: Diagnosis not present

## 2016-07-02 DIAGNOSIS — Q6211 Congenital occlusion of ureteropelvic junction: Secondary | ICD-10-CM

## 2016-07-02 MED ORDER — TECHNETIUM TC 99M MERTIATIDE
5.0000 | Freq: Once | INTRAVENOUS | Status: AC | PRN
  Administered 2016-07-02: 5.459 via INTRAVENOUS

## 2016-07-02 MED ORDER — FUROSEMIDE 10 MG/ML IJ SOLN
20.0000 mg | Freq: Once | INTRAMUSCULAR | Status: AC
  Administered 2016-07-02: 20 mg via INTRAVENOUS
  Filled 2016-07-02: qty 2

## 2016-07-04 ENCOUNTER — Telehealth: Payer: Self-pay

## 2016-07-04 NOTE — Telephone Encounter (Signed)
  Oncology Nurse Navigator Documentation Received call from [facility]. She would like to have local Urologist place stent asap. Dr. Mike Gip notified and is going to contact Dr. Erlene Quan. Ms Rayburn Go notified that [facility] should call her for an appt. Navigator Location: [facility] (07/04/16 1400)   )Navigator Encounter Type: Telephone (07/04/16 1400) Telephone: Incoming Call (07/04/16 1400)                       Barriers/Navigation Needs: Coordination of Care (07/04/16 1400)   Interventions: Coordination of Care (07/04/16 1400)   Coordination of Care: Appts (07/04/16 1400)                  Time Spent with Patient: 30 (07/04/16 1400)

## 2016-07-05 NOTE — Telephone Encounter (Signed)
Not OK. Needs to be seen tomorrow in order to get nephrostomy tube arrange to start chemo ASAP. This should not wait.  I am in OR Monday/ Tuesday so can't see them then.  If the appointment is delayed until next Wednesday, she will be not be able to get a nephrostomy tube until the following week which will keep delaying her chemotherapy. She has to understand that this is imperative.   Hollice Espy, MD

## 2016-07-05 NOTE — Telephone Encounter (Signed)
Patient would like to follow with [facility].  Discussed case with Dr. Mike Gip today.    Lasix renal was also reviewed. Split function only 4% but completely obstructed at the time of study. Based on the amount of parenchyma, I do believe that they're slightly more than 4% function although overall minimal.  I will see and evaluate the patient tomorrow in clinic today discussed the risk and benefits of nephrostomy tube placement. If she is agreeable, we'll proceed with right nephrostomy tube placement to help optimize function during chemotherapy.  Hollice Espy, MD

## 2016-07-05 NOTE — Telephone Encounter (Signed)
This patient's caregiver called asking about the appt and I offered her an appt with you for tomorrow and she said she had to much going on to bring her. She said she didn't have anybody else to bring her. So she could only come on the 25th which was her day off. So I made the appt for then. Please let me know if this is ok? I only did what they asked me to do, even though I told them you wanted her to be seen tomorrow.   Sharyn Lull

## 2016-07-06 ENCOUNTER — Encounter: Payer: Self-pay | Admitting: Urology

## 2016-07-06 ENCOUNTER — Ambulatory Visit (INDEPENDENT_AMBULATORY_CARE_PROVIDER_SITE_OTHER): Payer: 59 | Admitting: Urology

## 2016-07-06 VITALS — BP 130/82 | HR 114 | Ht 66.0 in | Wt 135.0 lb

## 2016-07-06 DIAGNOSIS — C661 Malignant neoplasm of right ureter: Secondary | ICD-10-CM

## 2016-07-06 DIAGNOSIS — N261 Atrophy of kidney (terminal): Secondary | ICD-10-CM | POA: Diagnosis not present

## 2016-07-06 DIAGNOSIS — N1330 Unspecified hydronephrosis: Secondary | ICD-10-CM

## 2016-07-06 DIAGNOSIS — T7411XA Adult physical abuse, confirmed, initial encounter: Secondary | ICD-10-CM | POA: Insufficient documentation

## 2016-07-06 DIAGNOSIS — F4310 Post-traumatic stress disorder, unspecified: Secondary | ICD-10-CM | POA: Insufficient documentation

## 2016-07-06 DIAGNOSIS — N183 Chronic kidney disease, stage 3 unspecified: Secondary | ICD-10-CM

## 2016-07-07 NOTE — Progress Notes (Signed)
07/06/2016 2:15 PM   Sandra Landry March 30, 1951 [id number]  Referring provider: Lynnell Jude, MD [address] [place], [place] [id number]  Chief Complaint  Patient presents with  . 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 She does have a very small 4 mm left lower lobe pulmonary nodule of unclear significance but otherwise no obvious metastatic disease. She is being followed at the [facility] cancer center by Dr. Mike Gip and was previously seen and evaluated by Dr. Horton Finer at [facility] urology.   She does have severe right hydronephrosis with significant cortical atrophy on this side. A Lasix renal scan showed 4% differential function although she was obstructed at the time of the study.  Her creatinine has ranged from 1.12-1.42.      Overall, she's having a lot of pelvic pain and discomfort, nausea, and weight loss. She also has some mild difficulty emptying her bladder and with bowel movements.  She is very anxious about starting chemotherapy.  She is a former tobacco smoker. She also continues to smoke marijuana. 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  PMH: Past Medical History:  Diagnosis Date  . Anxiety   . Arthritis   . Asthma    mild  . Cervical disc disease   . COPD (chronic obstructive pulmonary disease) (HCC)    mild  . Depression   . Dysrhythmia    tachycardia  . Fibromyalgia   . GERD (gastroesophageal reflux disease)   . Hepatitis C     Surgical History: Past Surgical History:  Procedure Laterality Date  . cervical discectomy with fusion    . COLONOSCOPY  01/2016  . EUS N/A 06/21/2016   Procedure: LOWER ENDOSCOPIC ULTRASOUND (EUS);  Surgeon: Reita Cliche, MD;  Location: [facility] ENDOSCOPY;  Service: Endoscopy;  Laterality: N/A;    Home Medications:    Medication List        Accurate as of 07/06/16 11:59 PM. Always use your most recent med list.          baclofen 20 MG tablet Commonly known as:  LIORESAL Take 20 mg by mouth 4 (four) times daily as needed for muscle spasms.   cetirizine 10 MG tablet Commonly known as:  ZYRTEC Take 10 mg by mouth daily.   diltiazem 120 MG 24 hr capsule Commonly known as:  CARDIZEM CD Take 120 mg by mouth daily.   diphenhydrAMINE 25 MG tablet Commonly known as:  BENADRYL Take 25-50 mg by mouth every 6 (six) hours as needed.   escitalopram 20 MG tablet Commonly known as:  LEXAPRO Take 20 mg by mouth daily.   ibuprofen 200 MG tablet Commonly known as:  ADVIL,MOTRIN Take 200-600 mg by mouth every 6 (six) hours as needed for mild pain or moderate pain.   lactulose 10 GM/15ML solution Commonly known as:  CHRONULAC   oxyCODONE-acetaminophen 5-325 MG tablet Commonly known as:  PERCOCET/ROXICET   polyethylene glycol packet Commonly known as:  MIRALAX / GLYCOLAX Take 17 g by mouth daily as needed.   potassium chloride 10 MEQ tablet Commonly known as:  K-DUR Take 2 tablets (20 mEq total) by mouth daily.   potassium chloride 10 MEQ CR capsule Commonly known as:  MICRO-K   prochlorperazine 5 MG/ML injection Commonly known as:  COMPAZINE Inject 5-10 mg into the vein as directed. Every  4 to 6 hours as needed.   promethazine 25 MG suppository Commonly known as:  PHENERGAN Place 25 mg rectally as directed. Every 4 to 6 hours as needed   ranitidine 300 MG tablet Commonly known as:  ZANTAC Take 300 mg by mouth daily.   traMADol 50 MG tablet Commonly known as:  ULTRAM Take 50-100 mg by mouth every 4 (four) hours as needed.       Allergies:  Allergies  Allergen Reactions  . Other Swelling    Dust, ragweed  . Iodides   . Tetanus Toxoids Other (See Comments)    Localized pain and swelling  . Iodine Other (See Comments) and Rash    Limited history. Occurred in 45s, at age 58-12, received  contrast at public health facility in [place], while being evaluated for meningitis. Immediate rash - not itchy, "flat, red, perfect circles" over entire body. Has not received iodine contrast since.    Family History: History reviewed. No pertinent family history.  Social History:  reports that she has never smoked. She has never used smokeless tobacco. She reports that she uses drugs, including Marijuana. She reports that she does not drink alcohol.  ROS: 12 point ROS was performed, negative other than as per HPI.  Physical Exam: BP 130/82 (BP Location: Left Arm, Patient Position: Sitting, Cuff Size: Normal)   Pulse (!) 114   Ht 5\' 6"  (1.676 m)   Wt 135 lb (61.2 kg)   BMI 21.79 kg/m   Constitutional:  Alert and oriented, No acute distress.  Somewhat cachectic appearing. Presents today with partner. HEENT: [place] AT, moist mucus membranes.  Trachea midline, no masses. Cardiovascular: No clubbing, cyanosis, or edema. Respiratory: Normal respiratory effort, no increased work of breathing. GI: Abdomen is soft, nontender, nondistended, no abdominal masses. Skin: No rashes, bruises or suspicious lesions. Neurologic: Grossly intact, no focal deficits, moving all 4 extremities. Psychiatric: Normal mood and affect.  Laboratory Data: Lab Results  Component Value Date   WBC 19.8 (H) 06/01/2016   HGB 9.3 (L) 06/01/2016   HCT 27.8 (L) 06/01/2016   MCV 79.3 (L) 06/01/2016   PLT 277 06/01/2016    Lab Results  Component Value Date   CREATININE 1.12 (H) 06/01/2016    Urinalysis    Component Value Date/Time   COLORURINE STRAW (A) 06/01/2016 1859   APPEARANCEUR CLEAR (A) 06/01/2016 1859   LABSPEC 1.004 (L) 06/01/2016 1859   PHURINE 6.0 06/01/2016 1859   GLUCOSEU NEGATIVE 06/01/2016 1859   HGBUR NEGATIVE 06/01/2016 1859   BILIRUBINUR NEGATIVE 06/01/2016 1859   KETONESUR NEGATIVE 06/01/2016 1859   PROTEINUR NEGATIVE 06/01/2016 1859   NITRITE NEGATIVE 06/01/2016 1859   LEUKOCYTESUR  NEGATIVE 06/01/2016 1859    Pertinent Imaging: CT abdomen and pelvis on 05/14/2016 along with PET scan and nuclear medicine Lasix renogram were all reviewed today personally and with the patient.  Assessment & Plan:    1. Urothelial carcinoma of right distal ureter (HCC) Locally advanced T4 TCC involving rectosigmoid incomplete obstruction of the right kidney resulting in hydronephrosis and chronic renal atrophy. She'll be undergoing neoadjuvant chemotherapy, ideally in the form of gemcitabine and cis-platinum but will need to have her renal function optimized in order to tolerate [place] based agent. On CT scan, she does have some residual parenchyma in her right kidney although Lasix renogram shows only 4% differential function. I suspect that this is underestimated as she was obstructed at the time of the study. As such, I do feel that she may  possibly benefit from right percutaneous nephrostomy tube placement. I do not feel that a ureteral stent would technically be possible and would likely become obstructed from extrinsic compression of the mass.  She did ask questions today about her prognosis which were answered at the best of my ability.  She is willing to proceed with nephrostomy tube placement. Risks and benefits were discussed in detail.    We'll plan on rechecking her BMP approximate one week after nephrostomy tube placement. She'll be seeing Dr. Mike Gip later next week after her nephrostomy tube placement for planning for initiation of chemotherapy. She will need a port.  She is scheduled for nephrostomy tube placement on Wednesday.  I will continue to follow her electronically until she completes her chemotherapy. At that point, we will reassess the role of possible surgical resection.  2. Hydronephrosis of right kidney As above  3. Right renal atrophy As above  4. CKD (chronic kidney disease) stage 3, GFR 30-59 ml/min As above   Sandra Espy, MD  [facility] [address], [place], [place] [address] [phone]  I spent 45 min with this patient of which greater than 50% was spent in counseling and coordination of care with the patient.   Case was discussed with Dr. Thurmond Butts and Dr. Mike Gip.

## 2016-07-11 ENCOUNTER — Ambulatory Visit
Admission: RE | Admit: 2016-07-11 | Discharge: 2016-07-11 | Disposition: A | Discharge status: Home / Self Care | Payer: 59 | Source: Ambulatory Visit | Attending: Interventional Radiology | Admitting: Interventional Radiology

## 2016-07-11 ENCOUNTER — Ambulatory Visit
Admission: RE | Admit: 2016-07-11 | Discharge: 2016-07-11 | Disposition: A | Discharge status: Home / Self Care | Payer: 59 | Source: Ambulatory Visit | Attending: Urology | Admitting: Urology

## 2016-07-11 ENCOUNTER — Ambulatory Visit: Payer: Self-pay | Admitting: Urology

## 2016-07-11 DIAGNOSIS — N1330 Unspecified hydronephrosis: Secondary | ICD-10-CM | POA: Diagnosis not present

## 2016-07-11 DIAGNOSIS — Z936 Other artificial openings of urinary tract status: Secondary | ICD-10-CM | POA: Insufficient documentation

## 2016-07-11 DIAGNOSIS — C669 Malignant neoplasm of unspecified ureter: Secondary | ICD-10-CM

## 2016-07-11 HISTORY — DX: Malignant (primary) neoplasm, unspecified: C80.1

## 2016-07-11 HISTORY — PX: IR GENERIC HISTORICAL: IMG1180011

## 2016-07-11 HISTORY — DX: Personal history of other diseases of the digestive system: Z87.19

## 2016-07-11 LAB — PROTIME-INR
INR: 0.98
Prothrombin Time: 13 seconds (ref 11.4–15.2)

## 2016-07-11 LAB — BASIC METABOLIC PANEL
ANION GAP: 10 (ref 5–15)
BUN: 14 mg/dL (ref 6–20)
CHLORIDE: 104 mmol/L (ref 101–111)
CO2: 22 mmol/L (ref 22–32)
Calcium: 9.2 mg/dL (ref 8.9–10.3)
Creatinine, Ser: 1.33 mg/dL — ABNORMAL HIGH (ref 0.44–1.00)
GFR calc Af Amer: 48 mL/min — ABNORMAL LOW (ref >60)
GFR, EST NON AFRICAN AMERICAN: 42 mL/min — AB (ref >60)
GLUCOSE: 95 mg/dL (ref 65–99)
POTASSIUM: 3.9 mmol/L (ref 3.5–5.1)
Sodium: 136 mmol/L (ref 135–145)

## 2016-07-11 LAB — CBC
HEMATOCRIT: 28.5 % — AB (ref 35.0–47.0)
HEMOGLOBIN: 9.5 g/dL — AB (ref 12.0–16.0)
MCH: 26.7 pg (ref 26.0–34.0)
MCHC: 33.2 g/dL (ref 32.0–36.0)
MCV: 80.4 fL (ref 80.0–100.0)
PLATELETS: 406 10*3/uL (ref 150–440)
RBC: 3.54 MIL/uL — AB (ref 3.80–5.20)
RDW: 17 % — ABNORMAL HIGH (ref 11.5–14.5)
WBC: 27.6 10*3/uL — AB (ref 3.6–11.0)

## 2016-07-11 LAB — APTT: aPTT: 26 seconds (ref 24–36)

## 2016-07-11 MED ORDER — FENTANYL CITRATE (PF) 100 MCG/2ML IJ SOLN
INTRAMUSCULAR | Status: AC | PRN
  Administered 2016-07-11: 50 ug via INTRAVENOUS

## 2016-07-11 MED ORDER — CIPROFLOXACIN IN D5W 400 MG/200ML IV SOLN
400.0000 mg | Freq: Once | INTRAVENOUS | Status: AC
  Administered 2016-07-11: 400 mg via INTRAVENOUS

## 2016-07-11 MED ORDER — METHYLPREDNISOLONE SODIUM SUCC 125 MG IJ SOLR
125.0000 mg | Freq: Once | INTRAMUSCULAR | Status: AC
  Administered 2016-07-11: 125 mg via INTRAVENOUS
  Filled 2016-07-11: qty 2

## 2016-07-11 MED ORDER — SODIUM CHLORIDE 0.9 % IV SOLN
INTRAVENOUS | Status: DC
  Administered 2016-07-11: 13:00:00 via INTRAVENOUS

## 2016-07-11 MED ORDER — IOHEXOL 300 MG/ML  SOLN
30.0000 mL | Freq: Once | INTRAMUSCULAR | Status: AC | PRN
  Administered 2016-07-11: 10 mL via INTRATHECAL

## 2016-07-11 MED ORDER — MIDAZOLAM HCL 2 MG/2ML IJ SOLN
INTRAMUSCULAR | Status: AC | PRN
  Administered 2016-07-11: 1 mg via INTRAVENOUS

## 2016-07-11 NOTE — Discharge Instructions (Signed)
Percutaneous Nephrostomy, Care After °Refer to this sheet in the next few weeks. These instructions provide you with information on caring for yourself after your procedure. Your health care provider may also give you more specific instructions. Your treatment has been planned according to current medical practices, but problems sometimes occur. Call your health care provider if you have any problems or questions after your procedure. °WHAT TO EXPECT AFTER THE PROCEDURE °You will need to remain lying down for several hours. °HOME CARE INSTRUCTIONS °· Your nephrostomy tube is connected to a leg bag or bedside drainage bag. Always keep the tubing, the leg bag, or the bedside drainage bags below the level of the kidney so that the urine drains freely. °· During the day, if you are connecting the nephrostomy tube to a leg bag, be sure there are no kinks in the tubing and that the urine is draining freely. °· At night, you may want to connect the nephrostomy tube or the leg bag to a larger bedside drainage bag. °· Change the dressing as often as directed by your health care provider, or if it becomes wet. °¨ Gently remove the tapes and dressing from around the nephrostomy tube. Be careful not to pull on the tube while removing the dressing. °¨ Wash the skin around the tube, rinse well, and dry. °¨ Place two split drain sponges in and around the tube exit site. °¨ Place tape around edge of the dressing. °¨ Secure the nephrostomy tubing. Remember to make certain that the nephrostomy tube does not kink or become pinched closed. It can be useful to wrap any exposed tubing going from the nephrostomy tube to any of the connecting tubes to either the leg bag or drainage bag with an elastic bandage. °· Every three weeks, replace the leg bag, drainage bag, and any extension tubing connected to your nephrostomy tube. Your health care provider will explain how to change the drainage bag and extension tubing. °SEEK MEDICAL CARE  IF: °· You experience any problems with any of the valves or tubing. °· You have persistent pain or soreness in your back. °· You have a fever or chills. °SEEK IMMEDIATE MEDICAL CARE IF: °· You have abdominal pain during the first week. °· You have a new appearance of blood in your urine. °· You have back pain that is not relieved by your medicine. °· You have drainage, redness, swelling, or pain at the tube insertion site. °· You have decreased urine output. °· Your nephrostomy tube comes out. °  °This information is not intended to replace advice given to you by your health care provider. Make sure you discuss any questions you have with your health care provider. °  °Document Released: 04/26/2004 Document Revised: 09/24/2014 Document Reviewed: 04/30/2013 °Elsevier Interactive Patient Education ©2016 [employer] ° °

## 2016-07-11 NOTE — Procedures (Signed)
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  Ultrasound and fluoroscopic right nephrostomy insertion.  No immediate complications  EBL 0  Catheter to gravity drainage  Full report in PACs

## 2016-07-11 NOTE — H&P (Signed)
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   Referring Physician(s): Brandon,Ashley  History of Present Illness: Sandra Landry is a 65 y.o. 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 Severe right hydronephrosis. She has associated right flank and abdominal pain. No interval fevers. She presents today for outpatient right nephrostomy insertion prior to initiating chemotherapy.  Past Medical History:  Diagnosis Date  . Anxiety   . Arthritis   . Asthma    mild  . Cervical disc disease   . COPD (chronic obstructive pulmonary disease) (HCC)    mild  . Depression   . Dysrhythmia    tachycardia  . Fibromyalgia   . GERD (gastroesophageal reflux disease)   . Hepatitis C     Past Surgical History:  Procedure Laterality Date  . cervical discectomy with fusion    . COLONOSCOPY  01/2016  . EUS N/A 06/21/2016   Procedure: LOWER ENDOSCOPIC ULTRASOUND (EUS);  Surgeon: Reita Cliche, MD;  Location: [facility] ENDOSCOPY;  Service: Endoscopy;  Laterality: N/A;    Allergies: Other; Iodides; Tetanus toxoids; and Iodine  Medications: Prior to Admission medications   Medication Sig Start Date End Date Taking? Authorizing Provider  baclofen (LIORESAL) 20 MG tablet Take 20 mg by mouth 4 (four) times daily as needed for muscle spasms.  04/09/16   Historical Provider, MD  cetirizine (ZYRTEC) 10 MG tablet Take 10 mg by mouth daily.  06/25/15   Historical Provider, MD  diltiazem (CARDIZEM CD) 120 MG 24 hr capsule Take 120 mg by mouth daily.     Historical Provider, MD  diphenhydrAMINE (BENADRYL) 25 MG tablet Take 25-50 mg by mouth every 6 (six) hours as needed.    Historical Provider, MD  escitalopram (LEXAPRO) 20 MG tablet Take 20 mg by mouth daily.  04/26/16   Historical Provider, MD  ibuprofen  (ADVIL,MOTRIN) 200 MG tablet Take 200-600 mg by mouth every 6 (six) hours as needed for mild pain or moderate pain.    Historical Provider, MD  lactulose [facility]) 10 GM/15ML solution  06/07/16   Historical Provider, MD  oxyCODONE-acetaminophen (PERCOCET/ROXICET) 5-325 MG tablet  06/05/16   Historical Provider, MD  polyethylene glycol (MIRALAX / GLYCOLAX) packet Take 17 g by mouth daily as needed.  05/07/16   Historical Provider, MD  potassium chloride (K-DUR) 10 MEQ tablet Take 2 tablets (20 mEq total) by mouth daily. 06/01/16 07/06/16  Merlyn Lot, MD  potassium chloride (MICRO-K) 10 MEQ CR capsule  06/02/16   Historical Provider, MD  prochlorperazine (COMPAZINE) 5 MG/ML injection Inject 5-10 mg into the vein as directed. Every 4 to 6 hours as needed.    Historical Provider, MD  promethazine (PHENERGAN) 25 MG suppository Place 25 mg rectally as directed. Every 4 to 6 hours as needed 02/20/16   Historical Provider, MD  ranitidine (ZANTAC) 300 MG tablet Take 300 mg by mouth daily.  03/08/15   Historical Provider, MD  traMADol (ULTRAM) 50 MG tablet Take 50-100 mg by mouth every 4 (four) hours as needed.     Historical Provider, MD     No family history on file.  Social History   Social History  . Marital status: Single    Spouse name: N/A  . Number of children: N/A  . Years of education: N/A   Social History Main Topics  . Smoking status:  Never Smoker  . Smokeless tobacco: Never Used  . Alcohol use No  . Drug use:     Types: Marijuana  . Sexual activity: Not on file   Other Topics Concern  . Not on file   Social History Narrative  . No narrative on file    ECOG Status: 1 - Symptomatic but completely ambulatory  Review of Systems: A 12 point ROS discussed and pertinent positives are indicated in the HPI above.  All other systems are negative.  Review of Systems  Vital Signs: There were no vitals taken for this visit.  Physical Exam  Constitutional: She is oriented to  person, place, and time. She appears well-developed and well-nourished. No distress.  Eyes: Conjunctivae are normal. No scleral icterus.  Cardiovascular: Normal rate and regular rhythm.  Exam reveals no friction rub.   No murmur heard. Pulmonary/Chest: Effort normal and breath sounds normal. No respiratory distress. She has no wheezes.  Abdominal: Soft. Bowel sounds are normal. She exhibits no distension and no mass. There is tenderness. There is no rebound.  Musculoskeletal: She exhibits no edema or tenderness.  Neurological: She is alert and oriented to person, place, and time.  Skin: Skin is warm and dry. She is not diaphoretic. No erythema.    Mallampati Score:   1  Imaging: Nm Renal Imaging Flow W/pharm  Result Date: 07/02/2016 CLINICAL DATA:  Urothelial carcinoma.  RIGHT ureteral obstruction. EXAM: NUCLEAR MEDICINE RENAL SCAN WITH DIURETIC ADMINISTRATION TECHNIQUE: Radionuclide angiographic and sequential renal images were obtained after intravenous injection of radiopharmaceutical. Imaging was continued during slow intravenous injection of Lasix approximately 15 minutes after the start of the examination. RADIOPHARMACEUTICALS:  5.5 mCi Technetium-5m MAG3 IV COMPARISON:  PET-CT 06/06/2016 FINDINGS: Flow:  Delayed profusion to the RIGHT kidney. Left renogram: Uniform uptake of counts in the renal cortex. Counts are promptly excreted into the collecting system and cleared prior to administration of Lasix. Lasix augment clearance. No postvoid residual. Right renogram: No visual RIGHT renal cortical uptake. The RIGHT kidney is not visualized. No evidence of excretion. Differential: Left kidney = 96 % Right kidney = 4 % T1/2 post Lasix : Left kidney =(counts clear near completely prior to administration of Lasix) Right kidney = no visible excretion. IMPRESSION: 1. Severe RIGHT kidney dysfunction with minimal arterial flow and no visual cortical uptake. 2. Asymmetric renal differential as above.  3. Normal LEFT kidney. Electronically Signed   By: Suzy Bouchard M.D.   On: 07/02/2016 17:11    Labs:  CBC:  Recent Labs  05/25/16 1232 05/30/16 0844 05/31/16 0508 06/01/16 1001  WBC 16.3* 20.8* 16.3* 19.8*  HGB 12.1 11.9* 8.8* 9.3*  HCT 36.0 35.8 27.5* 27.8*  PLT 296 353 244 277    COAGS: No results for input(s): INR, APTT in the last 8760 hours.  BMP:  Recent Labs  05/25/16 1232 05/30/16 0844 05/30/16 2106 05/31/16 0508 06/01/16 1001  NA 133* 134*  --  137 132*  K 3.0* 2.5* 3.5 3.6 3.1*  CL 100* 101  --  113* 101  CO2 18* 20*  --  19* 22  GLUCOSE 118* 207*  --  95 111*  BUN 13 15  --  9 13  CALCIUM 9.7 9.5  --  8.0* 8.6*  CREATININE 1.01* 1.42*  --  0.87 1.12*  GFRNONAA 58* 38*  --  >60 51*  GFRAA >60 45*  --  >60 59*    LIVER FUNCTION TESTS:  Recent Labs  05/25/16 1232 05/30/16 0844  06/01/16 1001  BILITOT 0.6 0.5 0.3  AST 20 20 15   ALT 9* 9* 11*  ALKPHOS 71 72 57  PROT 7.4 7.1 5.5*  ALBUMIN 3.4* 3.4* 2.8*    TUMOR MARKERS: No results for input(s): AFPTM, CEA, CA199, CHROMGRNA in the last 8760 hours.  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 Patient is status post biopsy during recent colonoscopy.  Plan: Ultrasound and fluoroscopically guided right percutaneous nephrostomy catheter today.  Risks and Benefits discussed with the patient including bleeding, infection, damage to adjacent structures, and sepsis. All of the patient's questions were answered, patient is agreeable to proceed. Consent signed and in chart.      Electronically Signed: Greggory Keen 07/11/2016, 12:29 PM   I spent a total of  30 Minutes   in face to face in clinical consultation, greater than 50% of which was counseling/coordinating care for with obstructive right hydronephrosis secondary to pelvic transitional cell  carcinoma.

## 2016-07-12 ENCOUNTER — Telehealth: Payer: Self-pay

## 2016-07-12 NOTE — Telephone Encounter (Signed)
Flush tube.  If flushes easily, then probably the kidney just isn't making much urine.    Hollice Espy, MD

## 2016-07-12 NOTE — Telephone Encounter (Signed)
Pt caregiver, Chong Sicilian, called stating yesterday they were d/c from hospital and got home around 3:30pm. Patty stated there was about 20cc in nephrostomy bag when they got home. Patty then stated that she changed from the leg bag to the night bag for pt to lay down. Since changing over to the night bag there is no output from the nephrostomy tube. Patty stated that pt has been drinking plenty of fluids. Denied n/v, f/c. Patty also stated pt is able to urinate without any complications or discomfort. Please advise.

## 2016-07-12 NOTE — Telephone Encounter (Signed)
Spoke with Patty in reference to neph tube output. Made Patty aware to flush tube and if flushes easily then kidney is not making much urine. Patty voiced understanding.

## 2016-07-12 NOTE — Telephone Encounter (Signed)
  Oncology Nurse Navigator Documentation Returned call for Ms. Rayburn Go. Voicemail left regarding plan of care regarding labs and chemo as she requested.  Navigator Location: [facility] (07/12/16 1000)   )  Telephone: Lahoma Crocker Call;Incoming Call (07/12/16 1000)                                                  Time Spent with Patient: 15 (07/12/16 1000)

## 2016-07-13 ENCOUNTER — Other Ambulatory Visit: Payer: Self-pay | Admitting: Hematology and Oncology

## 2016-07-13 NOTE — Progress Notes (Signed)
ON PATHWAY REGIMEN - Bladder  No Change  Continue With Treatment as Ordered.  BLAOS52: Gemcitabine 1,000 mg/m2 D1, 8 + Cisplatin 70 mg/m2 D1 q21 Days for a Maximum of 6 Cycles   A cycle is every 21 days:     Gemcitabine (Gemzar(R)) 1000 mg/m2 in 250 mL NS IV over 30 minutes days 1 and 8. Dose Mod: None     Cisplatin (Platinol(R)) 70 mg/m2 in 500 mL NS IV. **Prehydrate and consider post-hydration.***  Consider RX for 3 days of bid oral dexamethasone for delayed nausea. Dose Mod: None  **Always confirm dose/schedule in your pharmacy ordering system**    Patient Characteristics: Metastatic Disease, First Line, No Prior Neoadjuvant/Adjuvant Therapy AJCC Stage Grouping: IV Current evidence of distant metastases? Yes AJCC T Stage: X AJCC M Stage: X AJCC N Stage: X Line of therapy: First Line Would you be surprised if this patient died  in the next year? I would be surprised if this patient died in the next year Prior Neoadjuvant/Adjuvant Therapy? No  Intent of Therapy: Non-Curative / Palliative Intent, Discussed with Patient

## 2016-07-16 ENCOUNTER — Telehealth: Payer: Self-pay | Admitting: *Deleted

## 2016-07-16 NOTE — Telephone Encounter (Signed)
Would like a call back to discuss "next steps" and the fact that it is taking so long to get anything started.

## 2016-07-16 NOTE — Telephone Encounter (Signed)
Per Dr Mike Gip, she has been playing phone tag with urologist at [facility] and she wants to confer with him before she starts chemo. Patty informed and said she will call [facility]

## 2016-07-17 ENCOUNTER — Telehealth: Payer: Self-pay

## 2016-07-17 NOTE — Telephone Encounter (Signed)
I have called Patty twice today. She wants days that she can come to get chemo teaching and she was given 11/2, 11/7, 11/9.  She would work on [place] for 11/2 but then she called back and said it would be easier to have her come for class 11/7 and Patty will bring pt.  Then she wants dates for when pt can have port placed and I called and spoke to vascular office Mickel Baas and she said 11/6 or 11/8.  I called Patty back to give her these dates and she will get back with me.  I did tell her whatever day she gets port and chemo then the next day we can start treatment.  Patty asked about getting it in [place] and I said we can work on that as long as it is on tues, wed. Or Friday in [place] because that is only day there is a provider there.  She will call me back when she gets home and let me know so I can move forward with her scheduling.  In between this dr . Mike Gip wants pt to have a creat check when she comes for chemo class next tues and when Patty calls back I will let her know.

## 2016-07-17 NOTE — Telephone Encounter (Signed)
Message left with Darliss Ridgel regarding next steps. Labs, port a cath placement and chemo class are all being scheduled today. You should here from a scheduler today regarding those appts.

## 2016-07-17 NOTE — Telephone Encounter (Signed)
  Oncology Nurse Navigator Documentation Spoke with Ms. Rayburn Go. After making arrangements for the Lucianne Lei she decided that Tuesday chemo class 11/7 would be better and she can bring her. Lucianne Lei can be available in the future if needed. Navigator Location: [facility] (07/17/16 1400)   )  Telephone: Outgoing Call (07/17/16 1400)                                                  Time Spent with Patient: 15 (07/17/16 1400)

## 2016-07-18 ENCOUNTER — Other Ambulatory Visit (INDEPENDENT_AMBULATORY_CARE_PROVIDER_SITE_OTHER): Payer: Self-pay | Admitting: Vascular Surgery

## 2016-07-19 ENCOUNTER — Other Ambulatory Visit: Payer: 59

## 2016-07-20 ENCOUNTER — Other Ambulatory Visit: Payer: Self-pay | Admitting: *Deleted

## 2016-07-20 DIAGNOSIS — C661 Malignant neoplasm of right ureter: Secondary | ICD-10-CM

## 2016-07-20 NOTE — Patient Instructions (Signed)
Cisplatin injection  What is this medicine?  CISPLATIN (SIS pla tin) is a chemotherapy drug. It targets fast dividing cells, like cancer cells, and causes these cells to die. This medicine is used to treat many types of cancer like bladder, ovarian, and testicular cancers.  This medicine may be used for other purposes; ask your health care provider or pharmacist if you have questions.  What should I tell my health care provider before I take this medicine?  They need to know if you have any of these conditions:  -blood disorders  -hearing problems  -kidney disease  -recent or ongoing radiation therapy  -an unusual or allergic reaction to cisplatin, carboplatin, other chemotherapy, other medicines, foods, dyes, or preservatives  -pregnant or trying to get pregnant  -breast-feeding  How should I use this medicine?  This drug is given as an infusion into a vein. It is administered in a hospital or clinic by a specially trained health care professional.  Talk to your pediatrician regarding the use of this medicine in children. Special care may be needed.  Overdosage: If you think you have taken too much of this medicine contact a poison control center or emergency room at once.  NOTE: This medicine is only for you. Do not share this medicine with others.  What if I miss a dose?  It is important not to miss a dose. Call your doctor or health care professional if you are unable to keep an appointment.  What may interact with this medicine?  -dofetilide  -foscarnet  -medicines for seizures  -medicines to increase blood counts like filgrastim, pegfilgrastim, sargramostim  -probenecid  -pyridoxine used with altretamine  -rituximab  -some antibiotics like amikacin, gentamicin, neomycin, polymyxin B, streptomycin, tobramycin  -sulfinpyrazone  -vaccines  -zalcitabine  Talk to your doctor or health care professional before taking any of these medicines:  -acetaminophen  -aspirin  -ibuprofen  -ketoprofen  -naproxen  This list may  not describe all possible interactions. Give your health care provider a list of all the medicines, herbs, non-prescription drugs, or dietary supplements you use. Also tell them if you smoke, drink alcohol, or use illegal drugs. Some items may interact with your medicine.  What should I watch for while using this medicine?  Your condition will be monitored carefully while you are receiving this medicine. You will need important blood work done while you are taking this medicine.  This drug may make you feel generally unwell. This is not uncommon, as chemotherapy can affect healthy cells as well as cancer cells. Report any side effects. Continue your course of treatment even though you feel ill unless your doctor tells you to stop.  In some cases, you may be given additional medicines to help with side effects. Follow all directions for their use.  Call your doctor or health care professional for advice if you get a fever, chills or sore throat, or other symptoms of a cold or flu. Do not treat yourself. This drug decreases your body's ability to fight infections. Try to avoid being around people who are sick.  This medicine may increase your risk to bruise or bleed. Call your doctor or health care professional if you notice any unusual bleeding.  Be careful brushing and flossing your teeth or using a toothpick because you may get an infection or bleed more easily. If you have any dental work done, tell your dentist you are receiving this medicine.  Avoid taking products that contain aspirin, acetaminophen, ibuprofen, naproxen, or   ketoprofen unless instructed by your doctor. These medicines may hide a fever.  Do not become pregnant while taking this medicine. Women should inform their doctor if they wish to become pregnant or think they might be pregnant. There is a potential for serious side effects to an unborn child. Talk to your health care professional or pharmacist for more information. Do not breast-feed an  infant while taking this medicine.  Drink fluids as directed while you are taking this medicine. This will help protect your kidneys.  Call your doctor or health care professional if you get diarrhea. Do not treat yourself.  What side effects may I notice from receiving this medicine?  Side effects that you should report to your doctor or health care professional as soon as possible:  -allergic reactions like skin rash, itching or hives, swelling of the face, lips, or tongue  -signs of infection - fever or chills, cough, sore throat, pain or difficulty passing urine  -signs of decreased platelets or bleeding - bruising, pinpoint red spots on the skin, black, tarry stools, nosebleeds  -signs of decreased red blood cells - unusually weak or tired, fainting spells, lightheadedness  -breathing problems  -changes in hearing  -gout pain  -low blood counts - This drug may decrease the number of white blood cells, red blood cells and platelets. You may be at increased risk for infections and bleeding.  -nausea and vomiting  -pain, swelling, redness or irritation at the injection site  -pain, tingling, numbness in the hands or feet  -problems with balance, movement  -trouble passing urine or change in the amount of urine  Side effects that usually do not require medical attention (report to your doctor or health care professional if they continue or are bothersome):  -changes in vision  -loss of appetite  -metallic taste in the mouth or changes in taste  This list may not describe all possible side effects. Call your doctor for medical advice about side effects. You may report side effects to FDA at 1-800-FDA-1088.  Where should I keep my medicine?  This drug is given in a hospital or clinic and will not be stored at home.  NOTE: This sheet is a summary. It may not cover all possible information. If you have questions about this medicine, talk to your doctor, pharmacist, or health care provider.      2016, Elsevier/Gold  Standard. (2007-12-09 14:40:54)    Gemcitabine injection  What is this medicine?  GEMCITABINE (jem SIT a been) is a chemotherapy drug. This medicine is used to treat many types of cancer like breast cancer, lung cancer, pancreatic cancer, and ovarian cancer.  This medicine may be used for other purposes; ask your health care provider or pharmacist if you have questions.  What should I tell my health care provider before I take this medicine?  They need to know if you have any of these conditions:  -blood disorders  -infection  -kidney disease  -liver disease  -recent or ongoing radiation therapy  -an unusual or allergic reaction to gemcitabine, other chemotherapy, other medicines, foods, dyes, or preservatives  -pregnant or trying to get pregnant  -breast-feeding  How should I use this medicine?  This drug is given as an infusion into a vein. It is administered in a hospital or clinic by a specially trained health care professional.  Talk to your pediatrician regarding the use of this medicine in children. Special care may be needed.  Overdosage: If you think you have taken   too much of this medicine contact a poison control center or emergency room at once.  NOTE: This medicine is only for you. Do not share this medicine with others.  What if I miss a dose?  It is important not to miss your dose. Call your doctor or health care professional if you are unable to keep an appointment.  What may interact with this medicine?  -medicines to increase blood counts like filgrastim, pegfilgrastim, sargramostim  -some other chemotherapy drugs like cisplatin  -vaccines  Talk to your doctor or health care professional before taking any of these medicines:  -acetaminophen  -aspirin  -ibuprofen  -ketoprofen  -naproxen  This list may not describe all possible interactions. Give your health care provider a list of all the medicines, herbs, non-prescription drugs, or dietary supplements you use. Also tell them if you smoke, drink  alcohol, or use illegal drugs. Some items may interact with your medicine.  What should I watch for while using this medicine?  Visit your doctor for checks on your progress. This drug may make you feel generally unwell. This is not uncommon, as chemotherapy can affect healthy cells as well as cancer cells. Report any side effects. Continue your course of treatment even though you feel ill unless your doctor tells you to stop.  In some cases, you may be given additional medicines to help with side effects. Follow all directions for their use.  Call your doctor or health care professional for advice if you get a fever, chills or sore throat, or other symptoms of a cold or flu. Do not treat yourself. This drug decreases your body's ability to fight infections. Try to avoid being around people who are sick.  This medicine may increase your risk to bruise or bleed. Call your doctor or health care professional if you notice any unusual bleeding.  Be careful brushing and flossing your teeth or using a toothpick because you may get an infection or bleed more easily. If you have any dental work done, tell your dentist you are receiving this medicine.  Avoid taking products that contain aspirin, acetaminophen, ibuprofen, naproxen, or ketoprofen unless instructed by your doctor. These medicines may hide a fever.  Women should inform their doctor if they wish to become pregnant or think they might be pregnant. There is a potential for serious side effects to an unborn child. Talk to your health care professional or pharmacist for more information. Do not breast-feed an infant while taking this medicine.  What side effects may I notice from receiving this medicine?  Side effects that you should report to your doctor or health care professional as soon as possible:  -allergic reactions like skin rash, itching or hives, swelling of the face, lips, or tongue  -low blood counts - this medicine may decrease the number of white blood  cells, red blood cells and platelets. You may be at increased risk for infections and bleeding.  -signs of infection - fever or chills, cough, sore throat, pain or difficulty passing urine  -signs of decreased platelets or bleeding - bruising, pinpoint red spots on the skin, black, tarry stools, blood in the urine  -signs of decreased red blood cells - unusually weak or tired, fainting spells, lightheadedness  -breathing problems  -chest pain  -mouth sores  -nausea and vomiting  -pain, swelling, redness at site where injected  -pain, tingling, numbness in the hands or feet  -stomach pain  -swelling of ankles, feet, hands  -unusual bleeding  Side   effects that usually do not require medical attention (report to your doctor or health care professional if they continue or are bothersome):  -constipation  -diarrhea  -hair loss  -loss of appetite  -stomach upset  This list may not describe all possible side effects. Call your doctor for medical advice about side effects. You may report side effects to FDA at 1-800-FDA-1088.  Where should I keep my medicine?  This drug is given in a hospital or clinic and will not be stored at home.  NOTE: This sheet is a summary. It may not cover all possible information. If you have questions about this medicine, talk to your doctor, pharmacist, or health care provider.      2016, Elsevier/Gold Standard. (2008-01-13 18:45:54)

## 2016-07-22 MED ORDER — SODIUM CHLORIDE 0.9 % IR SOLN
Freq: Once | Status: DC
  Filled 2016-07-22: qty 2

## 2016-07-22 MED ORDER — DEXTROSE 5 % IV SOLN: 1.5000 g | INTRAVENOUS | Status: DC

## 2016-07-23 ENCOUNTER — Ambulatory Visit
Admission: RE | Admit: 2016-07-23 | Discharge: 2016-07-23 | Disposition: A | Discharge status: Home / Self Care | Payer: 59 | Source: Ambulatory Visit | Attending: Vascular Surgery | Admitting: Vascular Surgery

## 2016-07-23 ENCOUNTER — Encounter
Admission: RE | Disposition: A | Discharge status: Home / Self Care | Payer: Self-pay | Source: Ambulatory Visit | Attending: Vascular Surgery

## 2016-07-23 ENCOUNTER — Inpatient Hospital Stay: Payer: Managed Care, Other (non HMO) | Attending: Hematology and Oncology

## 2016-07-23 ENCOUNTER — Encounter: Payer: Self-pay | Admitting: *Deleted

## 2016-07-23 ENCOUNTER — Telehealth: Payer: Self-pay | Admitting: *Deleted

## 2016-07-23 ENCOUNTER — Other Ambulatory Visit: Payer: 59

## 2016-07-23 DIAGNOSIS — R634 Abnormal weight loss: Secondary | ICD-10-CM | POA: Insufficient documentation

## 2016-07-23 DIAGNOSIS — F419 Anxiety disorder, unspecified: Secondary | ICD-10-CM | POA: Insufficient documentation

## 2016-07-23 DIAGNOSIS — J45909 Unspecified asthma, uncomplicated: Secondary | ICD-10-CM | POA: Diagnosis not present

## 2016-07-23 DIAGNOSIS — Z5111 Encounter for antineoplastic chemotherapy: Secondary | ICD-10-CM | POA: Insufficient documentation

## 2016-07-23 DIAGNOSIS — Z887 Allergy status to serum and vaccine status: Secondary | ICD-10-CM | POA: Diagnosis not present

## 2016-07-23 DIAGNOSIS — F329 Major depressive disorder, single episode, unspecified: Secondary | ICD-10-CM | POA: Insufficient documentation

## 2016-07-23 DIAGNOSIS — R5383 Other fatigue: Secondary | ICD-10-CM | POA: Diagnosis not present

## 2016-07-23 DIAGNOSIS — C661 Malignant neoplasm of right ureter: Secondary | ICD-10-CM | POA: Diagnosis not present

## 2016-07-23 DIAGNOSIS — M129 Arthropathy, unspecified: Secondary | ICD-10-CM | POA: Insufficient documentation

## 2016-07-23 DIAGNOSIS — Z8619 Personal history of other infectious and parasitic diseases: Secondary | ICD-10-CM | POA: Diagnosis not present

## 2016-07-23 DIAGNOSIS — B1920 Unspecified viral hepatitis C without hepatic coma: Secondary | ICD-10-CM | POA: Insufficient documentation

## 2016-07-23 DIAGNOSIS — R531 Weakness: Secondary | ICD-10-CM | POA: Insufficient documentation

## 2016-07-23 DIAGNOSIS — M797 Fibromyalgia: Secondary | ICD-10-CM | POA: Insufficient documentation

## 2016-07-23 DIAGNOSIS — R000 Tachycardia, unspecified: Secondary | ICD-10-CM | POA: Insufficient documentation

## 2016-07-23 DIAGNOSIS — M5030 Other cervical disc degeneration, unspecified cervical region: Secondary | ICD-10-CM | POA: Diagnosis not present

## 2016-07-23 DIAGNOSIS — K219 Gastro-esophageal reflux disease without esophagitis: Secondary | ICD-10-CM | POA: Diagnosis not present

## 2016-07-23 DIAGNOSIS — R1031 Right lower quadrant pain: Secondary | ICD-10-CM | POA: Insufficient documentation

## 2016-07-23 DIAGNOSIS — I499 Cardiac arrhythmia, unspecified: Secondary | ICD-10-CM | POA: Insufficient documentation

## 2016-07-23 DIAGNOSIS — J449 Chronic obstructive pulmonary disease, unspecified: Secondary | ICD-10-CM | POA: Insufficient documentation

## 2016-07-23 DIAGNOSIS — F418 Other specified anxiety disorders: Secondary | ICD-10-CM | POA: Insufficient documentation

## 2016-07-23 DIAGNOSIS — N1330 Unspecified hydronephrosis: Secondary | ICD-10-CM | POA: Diagnosis not present

## 2016-07-23 DIAGNOSIS — M545 Low back pain: Secondary | ICD-10-CM | POA: Diagnosis not present

## 2016-07-23 DIAGNOSIS — R109 Unspecified abdominal pain: Secondary | ICD-10-CM | POA: Diagnosis not present

## 2016-07-23 DIAGNOSIS — R630 Anorexia: Secondary | ICD-10-CM | POA: Diagnosis not present

## 2016-07-23 DIAGNOSIS — Z888 Allergy status to other drugs, medicaments and biological substances status: Secondary | ICD-10-CM | POA: Insufficient documentation

## 2016-07-23 DIAGNOSIS — D72829 Elevated white blood cell count, unspecified: Secondary | ICD-10-CM | POA: Insufficient documentation

## 2016-07-23 DIAGNOSIS — K589 Irritable bowel syndrome without diarrhea: Secondary | ICD-10-CM | POA: Insufficient documentation

## 2016-07-23 DIAGNOSIS — C669 Malignant neoplasm of unspecified ureter: Secondary | ICD-10-CM | POA: Diagnosis present

## 2016-07-23 DIAGNOSIS — D638 Anemia in other chronic diseases classified elsewhere: Secondary | ICD-10-CM | POA: Insufficient documentation

## 2016-07-23 DIAGNOSIS — K769 Liver disease, unspecified: Secondary | ICD-10-CM | POA: Insufficient documentation

## 2016-07-23 DIAGNOSIS — J150 Pneumonia due to Klebsiella pneumoniae: Secondary | ICD-10-CM | POA: Diagnosis not present

## 2016-07-23 DIAGNOSIS — Z79899 Other long term (current) drug therapy: Secondary | ICD-10-CM | POA: Insufficient documentation

## 2016-07-23 HISTORY — PX: PERIPHERAL VASCULAR CATHETERIZATION: SHX172C

## 2016-07-23 LAB — COMPREHENSIVE METABOLIC PANEL
ALT: 6 U/L — ABNORMAL LOW (ref 14–54)
AST: 16 U/L (ref 15–41)
Albumin: 2.9 g/dL — ABNORMAL LOW (ref 3.5–5.0)
Alkaline Phosphatase: 91 U/L (ref 38–126)
Anion gap: 11 (ref 5–15)
BUN: 11 mg/dL (ref 6–20)
CO2: 26 mmol/L (ref 22–32)
Calcium: 9.5 mg/dL (ref 8.9–10.3)
Chloride: 100 mmol/L — ABNORMAL LOW (ref 101–111)
Creatinine, Ser: 0.98 mg/dL (ref 0.44–1.00)
GFR calc Af Amer: >60 mL/min (ref >60)
GFR calc non Af Amer: >60 mL/min (ref >60)
Glucose, Bld: 164 mg/dL — ABNORMAL HIGH (ref 65–99)
Potassium: 3.3 mmol/L — ABNORMAL LOW (ref 3.5–5.1)
Sodium: 137 mmol/L (ref 135–145)
Total Bilirubin: <0.1 mg/dL — ABNORMAL LOW (ref 0.3–1.2)
Total Protein: 6.9 g/dL (ref 6.5–8.1)

## 2016-07-23 LAB — CBC WITH DIFFERENTIAL/PLATELET
Basophils Absolute: 0.2 10*3/uL — ABNORMAL HIGH (ref 0–0.1)
Basophils Relative: 1 %
Eosinophils Absolute: 0.2 10*3/uL (ref 0–0.7)
Eosinophils Relative: 1 %
HCT: 28.2 % — ABNORMAL LOW (ref 35.0–47.0)
Hemoglobin: 9.1 g/dL — ABNORMAL LOW (ref 12.0–16.0)
Lymphocytes Relative: 11 %
Lymphs Abs: 3.2 10*3/uL (ref 1.0–3.6)
MCH: 25.8 pg — ABNORMAL LOW (ref 26.0–34.0)
MCHC: 32.5 g/dL (ref 32.0–36.0)
MCV: 79.4 fL — ABNORMAL LOW (ref 80.0–100.0)
Monocytes Absolute: 0.8 10*3/uL (ref 0.2–0.9)
Monocytes Relative: 3 %
Neutro Abs: 24.3 10*3/uL — ABNORMAL HIGH (ref 1.4–6.5)
Neutrophils Relative %: 84 %
Platelets: 537 10*3/uL — ABNORMAL HIGH (ref 150–440)
RBC: 3.55 MIL/uL — ABNORMAL LOW (ref 3.80–5.20)
RDW: 17.1 % — ABNORMAL HIGH (ref 11.5–14.5)
WBC: 28.7 10*3/uL — ABNORMAL HIGH (ref 3.6–11.0)

## 2016-07-23 SURGERY — PORTA CATH INSERTION
Anesthesia: Moderate Sedation

## 2016-07-23 MED ORDER — LIDOCAINE-EPINEPHRINE (PF) 1 %-1:200000 IJ SOLN
INTRAMUSCULAR | Status: AC
  Filled 2016-07-23: qty 30

## 2016-07-23 MED ORDER — FENTANYL CITRATE (PF) 100 MCG/2ML IJ SOLN
INTRAMUSCULAR | Status: AC
  Filled 2016-07-23: qty 2

## 2016-07-23 MED ORDER — ONDANSETRON HCL 4 MG/2ML IJ SOLN: 4.0000 mg | Freq: Four times a day (QID) | INTRAMUSCULAR | Status: DC | PRN

## 2016-07-23 MED ORDER — FENTANYL CITRATE (PF) 100 MCG/2ML IJ SOLN
INTRAMUSCULAR | Status: DC | PRN
  Administered 2016-07-23: 50 ug via INTRAVENOUS

## 2016-07-23 MED ORDER — METOPROLOL TARTRATE 5 MG/5ML IV SOLN
INTRAVENOUS | Status: AC
  Filled 2016-07-23: qty 5

## 2016-07-23 MED ORDER — METOPROLOL TARTRATE 5 MG/5ML IV SOLN
5.0000 mg | Freq: Once | INTRAVENOUS | Status: AC
  Administered 2016-07-23: 5 mg via INTRAVENOUS

## 2016-07-23 MED ORDER — SODIUM CHLORIDE 0.9 % IV SOLN: INTRAVENOUS | Status: DC

## 2016-07-23 MED ORDER — MIDAZOLAM HCL 2 MG/2ML IJ SOLN
INTRAMUSCULAR | Status: DC | PRN
  Administered 2016-07-23: 2 mg via INTRAVENOUS

## 2016-07-23 MED ORDER — HEPARIN (PORCINE) IN NACL 2-0.9 UNIT/ML-% IJ SOLN
INTRAMUSCULAR | Status: AC
  Filled 2016-07-23: qty 500

## 2016-07-23 MED ORDER — HYDROMORPHONE HCL 1 MG/ML IJ SOLN: 1.0000 mg | Freq: Once | INTRAMUSCULAR | Status: DC

## 2016-07-23 MED ORDER — MIDAZOLAM HCL 2 MG/2ML IJ SOLN
INTRAMUSCULAR | Status: AC
  Filled 2016-07-23: qty 2

## 2016-07-23 SURGICAL SUPPLY — 10 items
BAG DECANTER STRL (MISCELLANEOUS) ×3 IMPLANT
KIT PORT POWER 8FR ISP CVUE (Catheter) ×3 IMPLANT
PACK ANGIOGRAPHY (CUSTOM PROCEDURE TRAY) ×3 IMPLANT
PAD GROUND ADULT SPLIT (MISCELLANEOUS) ×3 IMPLANT
PENCIL ELECTRO HAND CTR (MISCELLANEOUS) ×3 IMPLANT
PREP CHG 10.5 TEAL (MISCELLANEOUS) ×3 IMPLANT
SUT MNCRL AB 4-0 PS2 18 (SUTURE) ×3 IMPLANT
SUT PROLENE 0 CT 1 30 (SUTURE) ×3 IMPLANT
SUTURE VIC 3-0 (SUTURE) ×3 IMPLANT
TOWEL OR 17X26 4PK STRL BLUE (TOWEL DISPOSABLE) ×3 IMPLANT

## 2016-07-23 NOTE — Telephone Encounter (Signed)
Requesting that the labs she ordered for tomorrow be drawn today while she is here getting her port placed. Please advise

## 2016-07-23 NOTE — Progress Notes (Signed)
  Oncology Nurse Navigator Documentation Appt confirmed for port a cath today with Ms. O'Connor   Navigator Location: [facility] (07/23/16 0800)   )  Telephone: Appt Confirmation/Clarification (07/23/16 0800)                                                  Time Spent with Patient: 15 (07/23/16 0800)

## 2016-07-23 NOTE — H&P (Signed)
[facility] History & Physical Update  The patient was interviewed and re-examined.  The patient's previous History and Physical has been reviewed and is unchanged.  There is no change in the plan of care. We plan to proceed with the scheduled procedure.  Leotis Pain, MD  07/23/2016, 12:07 PM

## 2016-07-23 NOTE — Discharge Instructions (Signed)
Implanted Port Insertion, Care After °Refer to this sheet in the next few weeks. These instructions provide you with information on caring for yourself after your procedure. Your health care provider may also give you more specific instructions. Your treatment has been planned according to current medical practices, but problems sometimes occur. Call your health care provider if you have any problems or questions after your procedure. °WHAT TO EXPECT AFTER THE PROCEDURE °After your procedure, it is typical to have the following:  °· Discomfort at the port insertion site. Ice packs to the area will help. °· Bruising on the skin over the port. This will subside in 3-4 days. °HOME CARE INSTRUCTIONS °· After your port is placed, you will get a manufacturer's information card. The card has information about your port. Keep this card with you at all times.   °· Know what kind of port you have. There are many types of ports available.   °· Wear a medical alert bracelet in case of an emergency. This can help alert health care workers that you have a port.   °· The port can stay in for as long as your health care provider believes it is necessary.   °· A home health care nurse may give medicines and take care of the port.   °· You or a family member can get special training and directions for giving medicine and taking care of the port at home.   °SEEK MEDICAL CARE IF:  °· Your port does not flush or you are unable to get a blood return.   °· You have a fever or chills. °SEEK IMMEDIATE MEDICAL CARE IF: °· You have new fluid or pus coming from your incision.   °· You notice a bad smell coming from your incision site.   °· You have swelling, pain, or more redness at the incision or port site.   °· You have chest pain or shortness of breath. °  °This information is not intended to replace advice given to you by your health care provider. Make sure you discuss any questions you have with your health care provider. °  °Document  Released: 06/24/2013 Document Revised: 09/08/2013 Document Reviewed: 06/24/2013 °Elsevier Interactive Patient Education ©2016 Elsevier Inc. ° °

## 2016-07-23 NOTE — Op Note (Signed)
      [facility]       Operative Note  Date: 07/23/2016  Preoperative diagnosis:  1. Ureteral cancer  Postoperative diagnosis:  Same as above  Procedures: #1. Ultrasound guidance for vascular access to the right internal jugular vein. #2. Fluoroscopic guidance for placement of catheter. #3. Placement of CT compatible Port-A-Cath, right internal jugular vein.  Surgeon: Leotis Pain, MD.   Anesthesia: Local with moderate conscious sedation for approximately 15  minutes using 2 mg of Versed and 50 mcg of Fentanyl  Fluoroscopy time: less than 1 minute  Contrast used: 0  Estimated blood loss: minimal  Indication for the procedure:  The patient is a 65 y.o.female with ureteral cancer.  The patient needs a Port-A-Cath for durable venous access, chemotherapy, lab draws, and CT scans. We are asked to place this. Risks and benefits were discussed and informed consent was obtained.  Description of procedure: The patient was brought to the vascular and interventional radiology suite.  Moderate conscious sedation was administered throughout the procedure during a face to face encounter with the patient with my supervision of the RN administering medicines and monitoring the patient's vital signs, pulse oximetry, telemetry and mental status throughout from the start of the procedure until the patient was taken to the recovery room. The right neck chest and shoulder were sterilely prepped and draped, and a sterile surgical field was created. Ultrasound was used to help visualize a patent right internal jugular vein. This was then accessed under direct ultrasound guidance without difficulty with the Seldinger needle and a permanent image was recorded. A J-wire was placed. After skin nick and dilatation, the peel-away sheath was then placed over the wire. I then anesthetized an area under the clavicle approximately 1-2 fingerbreadths. A transverse incision was created and an inferior  pocket was created with electrocautery and blunt dissection. The port was then brought onto the field, placed into the pocket and secured to the chest wall with 2 Prolene sutures. The catheter was connected to the port and tunneled from the subclavicular incision to the access site. Fluoroscopic guidance was then used to cut the catheter to an appropriate length. The catheter was then placed through the peel-away sheath and the peel-away sheath was removed. The catheter tip was parked in excellent location under fluorocoscopic guidance in the cavoatrial junction. The pocket was then irrigated with antibiotic impregnated saline and the wound was closed with a running 3-0 Vicryl and a 4-0 Monocryl. The access incision was closed with a single 4-0 Monocryl. The Huber needle was used to withdraw blood and flush the port with heparinized saline. Dermabond was then placed as a dressing. The patient tolerated the procedure well and was taken to the recovery room in stable condition.   Leotis Pain 07/23/2016 12:52 PM   This note was created with Dragon Medical transcription system. Any errors in dictation are purely unintentional.

## 2016-07-23 NOTE — Telephone Encounter (Signed)
Called and spoke to Lovell, patient's significant other, to let her know patient can get her Cr drawn today when she is finished having her port placed.

## 2016-07-23 NOTE — Telephone Encounter (Signed)
Pt aware that she can come over after port done for creat . Check. Rodena Piety called Patty to let her know

## 2016-07-24 ENCOUNTER — Telehealth: Payer: Self-pay | Admitting: *Deleted

## 2016-07-24 ENCOUNTER — Inpatient Hospital Stay: Payer: Managed Care, Other (non HMO)

## 2016-07-24 ENCOUNTER — Encounter: Payer: Self-pay | Admitting: Vascular Surgery

## 2016-07-24 ENCOUNTER — Other Ambulatory Visit: Payer: Self-pay | Admitting: *Deleted

## 2016-07-24 MED ORDER — POTASSIUM CHLORIDE ER 10 MEQ PO TBCR: 10.0000 meq | EXTENDED_RELEASE_TABLET | Freq: Every day | ORAL | 1 refills | Status: DC

## 2016-07-24 MED ORDER — LIDOCAINE-PRILOCAINE 2.5-2.5 % EX CREA: TOPICAL_CREAM | CUTANEOUS | 1 refills | Status: AC

## 2016-07-24 NOTE — Telephone Encounter (Signed)
-----   Message from Lequita Asal, MD sent at 07/23/2016  5:25 PM EST ----- Regarding: Please call patient  Potassium chloride 10 meq a day  M  ----- Message ----- From: Interface, Lab In Short Sent: 07/23/2016   2:27 PM To: Lequita Asal, MD

## 2016-07-24 NOTE — Telephone Encounter (Signed)
Called patient and spoke to significant other, Sandra Landry, to inform her that patient's K+ is low @ 3.3.  Patient should begin her K+ 10 meq again daily.  Sandra Landry verbalized understanding, however, is requesting new prescription for K+ as patient is out of medication. Also requesting EMLA prescription.  E-scribed to pharmacy.

## 2016-07-25 ENCOUNTER — Other Ambulatory Visit: Payer: Self-pay | Admitting: Hematology and Oncology

## 2016-07-26 ENCOUNTER — Ambulatory Visit
Admission: RE | Admit: 2016-07-26 | Discharge: 2016-07-26 | Disposition: A | Discharge status: Home / Self Care | Payer: 59 | Source: Ambulatory Visit | Attending: Hematology and Oncology | Admitting: Hematology and Oncology

## 2016-07-26 ENCOUNTER — Inpatient Hospital Stay: Payer: Managed Care, Other (non HMO)

## 2016-07-26 ENCOUNTER — Inpatient Hospital Stay (HOSPITAL_BASED_OUTPATIENT_CLINIC_OR_DEPARTMENT_OTHER): Payer: Managed Care, Other (non HMO) | Admitting: Hematology and Oncology

## 2016-07-26 ENCOUNTER — Other Ambulatory Visit: Payer: Self-pay | Admitting: *Deleted

## 2016-07-26 VITALS — BP 117/74 | HR 91 | Temp 96.9°F | Wt 128.1 lb

## 2016-07-26 VITALS — BP 124/78 | HR 93 | Resp 18

## 2016-07-26 DIAGNOSIS — R000 Tachycardia, unspecified: Secondary | ICD-10-CM

## 2016-07-26 DIAGNOSIS — F329 Major depressive disorder, single episode, unspecified: Secondary | ICD-10-CM

## 2016-07-26 DIAGNOSIS — J449 Chronic obstructive pulmonary disease, unspecified: Secondary | ICD-10-CM | POA: Diagnosis not present

## 2016-07-26 DIAGNOSIS — D72829 Elevated white blood cell count, unspecified: Secondary | ICD-10-CM

## 2016-07-26 DIAGNOSIS — R630 Anorexia: Secondary | ICD-10-CM | POA: Diagnosis not present

## 2016-07-26 DIAGNOSIS — R5383 Other fatigue: Secondary | ICD-10-CM | POA: Diagnosis not present

## 2016-07-26 DIAGNOSIS — C661 Malignant neoplasm of right ureter: Secondary | ICD-10-CM

## 2016-07-26 DIAGNOSIS — D649 Anemia, unspecified: Secondary | ICD-10-CM | POA: Diagnosis not present

## 2016-07-26 DIAGNOSIS — E860 Dehydration: Secondary | ICD-10-CM

## 2016-07-26 DIAGNOSIS — M129 Arthropathy, unspecified: Secondary | ICD-10-CM

## 2016-07-26 DIAGNOSIS — M5030 Other cervical disc degeneration, unspecified cervical region: Secondary | ICD-10-CM

## 2016-07-26 DIAGNOSIS — R531 Weakness: Secondary | ICD-10-CM

## 2016-07-26 DIAGNOSIS — I951 Orthostatic hypotension: Secondary | ICD-10-CM

## 2016-07-26 DIAGNOSIS — B1920 Unspecified viral hepatitis C without hepatic coma: Secondary | ICD-10-CM

## 2016-07-26 DIAGNOSIS — F419 Anxiety disorder, unspecified: Secondary | ICD-10-CM

## 2016-07-26 DIAGNOSIS — M797 Fibromyalgia: Secondary | ICD-10-CM

## 2016-07-26 DIAGNOSIS — I700 Atherosclerosis of aorta: Secondary | ICD-10-CM | POA: Diagnosis not present

## 2016-07-26 DIAGNOSIS — K589 Irritable bowel syndrome without diarrhea: Secondary | ICD-10-CM

## 2016-07-26 DIAGNOSIS — J45909 Unspecified asthma, uncomplicated: Secondary | ICD-10-CM

## 2016-07-26 DIAGNOSIS — I499 Cardiac arrhythmia, unspecified: Secondary | ICD-10-CM

## 2016-07-26 DIAGNOSIS — Z79899 Other long term (current) drug therapy: Secondary | ICD-10-CM

## 2016-07-26 DIAGNOSIS — K219 Gastro-esophageal reflux disease without esophagitis: Secondary | ICD-10-CM

## 2016-07-26 DIAGNOSIS — Z5111 Encounter for antineoplastic chemotherapy: Secondary | ICD-10-CM | POA: Diagnosis not present

## 2016-07-26 DIAGNOSIS — R634 Abnormal weight loss: Secondary | ICD-10-CM

## 2016-07-26 LAB — COMPREHENSIVE METABOLIC PANEL
ALT: 7 U/L — ABNORMAL LOW (ref 14–54)
AST: 12 U/L — ABNORMAL LOW (ref 15–41)
Albumin: 3 g/dL — ABNORMAL LOW (ref 3.5–5.0)
Alkaline Phosphatase: 85 U/L (ref 38–126)
Anion gap: 7 (ref 5–15)
BUN: 10 mg/dL (ref 6–20)
CO2: 24 mmol/L (ref 22–32)
Calcium: 9.3 mg/dL (ref 8.9–10.3)
Chloride: 101 mmol/L (ref 101–111)
Creatinine, Ser: 0.8 mg/dL (ref 0.44–1.00)
GFR calc Af Amer: >60 mL/min (ref >60)
GFR calc non Af Amer: >60 mL/min (ref >60)
Glucose, Bld: 114 mg/dL — ABNORMAL HIGH (ref 65–99)
Potassium: 3.5 mmol/L (ref 3.5–5.1)
Sodium: 132 mmol/L — ABNORMAL LOW (ref 135–145)
Total Bilirubin: 0.4 mg/dL (ref 0.3–1.2)
Total Protein: 6.5 g/dL (ref 6.5–8.1)

## 2016-07-26 LAB — URINALYSIS COMPLETE WITH MICROSCOPIC (ARMC ONLY)
Bacteria, UA: NONE SEEN
Bilirubin Urine: NEGATIVE
Glucose, UA: NEGATIVE mg/dL
Hgb urine dipstick: NEGATIVE
Ketones, ur: NEGATIVE mg/dL
Leukocytes, UA: NEGATIVE
Nitrite: NEGATIVE
Protein, ur: 100 mg/dL — AB
Specific Gravity, Urine: 1.019 (ref 1.005–1.030)
pH: 5 (ref 5.0–8.0)

## 2016-07-26 LAB — CBC WITH DIFFERENTIAL/PLATELET
Basophils Absolute: 0.1 10*3/uL (ref 0–0.1)
Basophils Relative: 1 %
Eosinophils Absolute: 0.2 10*3/uL (ref 0–0.7)
Eosinophils Relative: 1 %
HCT: 25.9 % — ABNORMAL LOW (ref 35.0–47.0)
Hemoglobin: 8.5 g/dL — ABNORMAL LOW (ref 12.0–16.0)
Lymphocytes Relative: 7 %
Lymphs Abs: 1.9 10*3/uL (ref 1.0–3.6)
MCH: 25.9 pg — ABNORMAL LOW (ref 26.0–34.0)
MCHC: 32.7 g/dL (ref 32.0–36.0)
MCV: 79.1 fL — ABNORMAL LOW (ref 80.0–100.0)
Monocytes Absolute: 1.2 10*3/uL — ABNORMAL HIGH (ref 0.2–0.9)
Monocytes Relative: 5 %
Neutro Abs: 23.1 10*3/uL — ABNORMAL HIGH (ref 1.4–6.5)
Neutrophils Relative %: 86 %
Platelets: 480 10*3/uL — ABNORMAL HIGH (ref 150–440)
RBC: 3.27 MIL/uL — ABNORMAL LOW (ref 3.80–5.20)
RDW: 16.9 % — ABNORMAL HIGH (ref 11.5–14.5)
WBC: 26.6 10*3/uL — ABNORMAL HIGH (ref 3.6–11.0)

## 2016-07-26 LAB — IRON AND TIBC
Iron: 7 ug/dL — ABNORMAL LOW (ref 28–170)
Saturation Ratios: 4 % — ABNORMAL LOW (ref 10.4–31.8)
TIBC: 179 ug/dL — ABNORMAL LOW (ref 250–450)
UIBC: 172 ug/dL

## 2016-07-26 LAB — VITAMIN B12: Vitamin B-12: 628 pg/mL (ref 180–914)

## 2016-07-26 LAB — MAGNESIUM: Magnesium: 1.9 mg/dL (ref 1.7–2.4)

## 2016-07-26 LAB — FERRITIN: Ferritin: 284 ng/mL (ref 11–307)

## 2016-07-26 LAB — SEDIMENTATION RATE: Sed Rate: 54 mm/hr — ABNORMAL HIGH (ref 0–30)

## 2016-07-26 LAB — FOLATE: Folate: 7.1 ng/mL (ref >5.9)

## 2016-07-26 MED ORDER — SODIUM CHLORIDE 0.9 % IV SOLN
INTRAVENOUS | Status: AC
  Administered 2016-07-26: 12:00:00 via INTRAVENOUS
  Filled 2016-07-26: qty 1000

## 2016-07-26 MED ORDER — SODIUM CHLORIDE 0.9% FLUSH
10.0000 mL | INTRAVENOUS | Status: AC | PRN
  Administered 2016-07-26: 10 mL via INTRAVENOUS
  Filled 2016-07-26: qty 10

## 2016-07-26 MED ORDER — MEGESTROL ACETATE 400 MG/10ML PO SUSP
200.0000 mg | Freq: Every day | ORAL | 0 refills | Status: DC
Start: 2016-07-26 — End: 2016-10-17

## 2016-07-26 MED ORDER — HEPARIN SOD (PORK) LOCK FLUSH 100 UNIT/ML IV SOLN
500.0000 [IU] | Freq: Once | INTRAVENOUS | Status: AC
  Administered 2016-07-26: 500 [IU] via INTRAVENOUS
  Filled 2016-07-26: qty 5

## 2016-07-26 MED ORDER — SODIUM CHLORIDE 0.9 % IV SOLN
INTRAVENOUS | Status: AC
  Filled 2016-07-26: qty 1000

## 2016-07-26 NOTE — Progress Notes (Addendum)
[facility] day:  07/26/2016  Chief Complaint: Sandra Landry is a 65 y.o. female with locally advanced right ureteral cancer who is seen for assessment prior to initiation of neoadjuvant chemotherapy.  HPI: The patient was last seen in the medical oncology clinic on 06/29/2016.  At that time, biopsies confirmed urothelial carcinoma.  She was to follow-up with Dr. Kathrin Ruddy, urologist at [facility].  She decided to have her procedures done locally.  She underwent right nephrostomy tube placement on 07/11/2016.   Labs on 07/23/2016 revealed a hematocrit of 28.2, hemoglobin 9.1, MCV 79.4, platelets 537,000, WBC 28,700 with an ANC of 24,300.  Creatinine had improved from 1.33 to 0.98.  Potassium was 3.3.  She was started on oral potassium.  She underwent port-a-cath placement on 07/23/2016 by Dr. Lucky Cowboy.  Per her partner, she required IV metoprolol during her port placement.  She attended the chemotherapy class.  Symptomatically, she is tired and fatigued. She is up doing the laundry and cooking, but resting most of the day. She has had no fevers. She has sweats and has soaked her sheets.  She is not eating well. She may take 3 or 4 bites 2 times a day.  She is drinking fluids.  Her nephrostomy tube is putting out little (5-10 ml/day).  She has had some blood in the tube. She flushed the tube with no change in output.    Past Medical History:  Diagnosis Date  . Anxiety   . Arthritis   . Asthma    mild  . Cervical disc disease   . COPD (chronic obstructive pulmonary disease) (HCC)    mild  . Depression   . Dysrhythmia    tachycardia  . Fibromyalgia   . GERD (gastroesophageal reflux disease)   . Hepatitis C     Past Surgical History:  Procedure Laterality Date  . cervical discectomy with fusion    . COLONOSCOPY  01/2016  . EUS N/A 06/21/2016   Procedure: LOWER ENDOSCOPIC ULTRASOUND (EUS);  Surgeon: Reita Cliche, MD;  Location: [facility] ENDOSCOPY;   Service: Endoscopy;  Laterality: N/A;    No family history on file.  Social History:  reports that she has never smoked. She has never used smokeless tobacco. She reports that she uses drugs, including Marijuana. She reports that she does not drink alcohol.  She lives in [place].  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  She has also worked in the Physicist, medical.  The patient is accompanied by her partner, Darliss Ridgel, a nurse, today.  Allergies:  Allergies  Allergen Reactions  . Other Swelling    Dust, ragweed  . Iodides   . Tetanus Toxoids Other (See Comments)    Localized pain and swelling  . Iodine Other (See Comments) and Rash    Limited history. Occurred in 4s, at age 34-12, received contrast at public health facility in [place], while being evaluated for meningitis. Immediate rash - not itchy, "flat, red, perfect circles" over entire body. Has not received iodine contrast since.    Current Medications: Current Outpatient Prescriptions  Medication Sig Dispense Refill  . baclofen (LIORESAL) 20 MG tablet Take 20 mg by mouth 4 (four) times daily as needed for muscle spasms.     . cetirizine (ZYRTEC) 10 MG tablet Take 10 mg by mouth daily.     Marland Kitchen diltiazem (CARDIZEM CD) 120 MG 24 hr capsule Take 120 mg by mouth daily.     Marland Kitchen  diphenhydrAMINE (BENADRYL) 25 MG tablet Take 25-50 mg by mouth every 6 (six) hours as needed.    Marland Kitchen escitalopram (LEXAPRO) 20 MG tablet Take 20 mg by mouth daily.     Marland Kitchen ibuprofen (ADVIL,MOTRIN) 200 MG tablet Take 200-600 mg by mouth every 6 (six) hours as needed for mild pain or moderate pain.    Marland Kitchen lactulose (CHRONULAC) 10 GM/15ML solution     . oxyCODONE-acetaminophen (PERCOCET/ROXICET) 5-325 MG tablet     . polyethylene glycol (MIRALAX / GLYCOLAX) packet Take 17 g by mouth daily as needed.     . prochlorperazine (COMPAZINE) 5 MG/ML injection Inject 5-10 mg into the vein as directed. Every 4 to 6 hours as needed.    . promethazine (PHENERGAN) 25 MG  suppository Place 25 mg rectally as directed. Every 4 to 6 hours as needed    . ranitidine (ZANTAC) 300 MG tablet Take 300 mg by mouth daily.     . traMADol (ULTRAM) 50 MG tablet Take 50-100 mg by mouth every 4 (four) hours as needed.     . potassium chloride (K-DUR) 10 MEQ tablet Take 2 tablets (20 mEq total) by mouth daily. 28 tablet 0  . potassium chloride (MICRO-K) 10 MEQ CR capsule      No current facility-administered medications for this visit.     Review of Systems:  GENERAL:  Fatigue.  Sweats.  No fever.  Weight loss of 38 pounds in year (weight down 8 pounds since last visit). PERFORMANCE STATUS (ECOG):  2 HEENT:  No visual changes, runny nose, sore throat, mouth sores or tenderness. Lungs:  Shortness of breath with exertion.  No cough.  No hemoptysis. Cardiac:  No chest pain, palpitations, orthopnea, or PND. GI:  Appetite 25% poor.  Chronic nausea and vomiting.  Irritable bowel.  Constipation.  No diarrhea, melena or hematochezia. GU:  Minimal urine output from ostomy tube (5-10 ml/day).  Hematuria from ostomy.  No urgency, frequency, or dysuria. Musculoskeletal:  No back pain.  No joint pain.  No muscle tenderness. Extremities:  No pain or swelling. Skin:  No rashes or skin changes. Neuro:  No headache, numbness or weakness, balance or coordination issues. Endocrine:  No diabetes, thyroid issues, hot flashes or night sweats. Psych:  Hypersensitive.  PTSD.  Depression.  Anxiety. Pain:  Low back pain. Review of systems:  All other systems reviewed and found to be negative.  Physical Exam: BP 117/74 (BP Location: Left Arm, Patient Position: Sitting)   Pulse 91   Temp (!) 96.9 F (36.1 C) (Tympanic)   Wt 128 lb 1.4 oz (58.1 kg)   BMI 20.67 kg/m  Blood pressure 94/62, pulse 65, temperature (!) 95.1 F (35.1 C), temperature source Tympanic, resp. rate 18, weight 136 lb 0.4 oz (61.7 kg). GENERAL:  Thin, chronically fatigued appearing woman who is sitting comfortably the exam  room ina wheelchair in no acute distress. MENTAL STATUS:  Alert and oriented to person, place and time. HEAD:  Short dark hair with graying.  Normocephalic, atraumatic, face symmetric, no Cushingoid features. EYES:  Pupils equal round and reactive to light and accomodation.  No conjunctivitis or scleral icterus. ENT:  Oropharynx clear without lesion.  No upper teeth.  Tongue normal. Mucous membranes moist.  RESPIRATORY:  Clear to auscultation without rales, wheezes or rhonchi. CARDIOVASCULAR:  Regular rate and rhythm without murmur, rub or gallop. CHEST WALL:  Unremarkable port-a-cath. ABDOMEN:  Soft, nontender with active bowel sounds and no hepatosplenomegaly.   BACK:  Right sided urostomy  with dressing in place. SKIN:  No rashes, ulcers or lesions. EXTREMITIES: No edema, no skin discoloration or tenderness.  No palpable cords. LYMPH NODES: No palpable cervical, supraclavicular, axillary or inguinal adenopathy  NEUROLOGICAL: Unremarkable. PSYCH:  Appropriate.   Infusion on 07/26/2016  Component Date Value Ref Range Status  . WBC 07/26/2016 26.6* 3.6 - 11.0 K/uL Final  . RBC 07/26/2016 3.27* 3.80 - 5.20 MIL/uL Final  . Hemoglobin 07/26/2016 8.5* 12.0 - 16.0 g/dL Final  . HCT 07/26/2016 25.9* 35.0 - 47.0 % Final  . MCV 07/26/2016 79.1* 80.0 - 100.0 fL Final  . MCH 07/26/2016 25.9* 26.0 - 34.0 pg Final  . MCHC 07/26/2016 32.7  32.0 - 36.0 g/dL Final  . RDW 07/26/2016 16.9* 11.5 - 14.5 % Final  . Platelets 07/26/2016 480* 150 - 440 K/uL Final  . Neutrophils Relative % 07/26/2016 86  % Final  . Neutro Abs 07/26/2016 23.1* 1.4 - 6.5 K/uL Final  . Lymphocytes Relative 07/26/2016 7  % Final  . Lymphs Abs 07/26/2016 1.9  1.0 - 3.6 K/uL Final  . Monocytes Relative 07/26/2016 5  % Final  . Monocytes Absolute 07/26/2016 1.2* 0.2 - 0.9 K/uL Final  . Eosinophils Relative 07/26/2016 1  % Final  . Eosinophils Absolute 07/26/2016 0.2  0 - 0.7 K/uL Final  . Basophils Relative 07/26/2016 1  %  Final  . Basophils Absolute 07/26/2016 0.1  0 - 0.1 K/uL Final  . Sodium 07/26/2016 132* 135 - 145 mmol/L Final  . Potassium 07/26/2016 3.5  3.5 - 5.1 mmol/L Final  . Chloride 07/26/2016 101  101 - 111 mmol/L Final  . CO2 07/26/2016 24  22 - 32 mmol/L Final  . Glucose, Bld 07/26/2016 114* 65 - 99 mg/dL Final  . BUN 07/26/2016 10  6 - 20 mg/dL Final  . Creatinine, Ser 07/26/2016 0.80  0.44 - 1.00 mg/dL Final  . Calcium 07/26/2016 9.3  8.9 - 10.3 mg/dL Final  . Total Protein 07/26/2016 6.5  6.5 - 8.1 g/dL Final  . Albumin 07/26/2016 3.0* 3.5 - 5.0 g/dL Final  . AST 07/26/2016 12* 15 - 41 U/L Final  . ALT 07/26/2016 7* 14 - 54 U/L Final  . Alkaline Phosphatase 07/26/2016 85  38 - 126 U/L Final  . Total Bilirubin 07/26/2016 0.4  0.3 - 1.2 mg/dL Final  . GFR calc non Af Amer 07/26/2016 >60  >60 mL/min Final  . GFR calc Af Amer 07/26/2016 >60  >60 mL/min Final   Comment: (NOTE) The eGFR has been calculated using the CKD EPI equation. This calculation has not been validated in all clinical situations. eGFR's persistently <60 mL/min signify possible Chronic Kidney Disease.   . Anion gap 07/26/2016 7  5 - 15 Final  . Magnesium 07/26/2016 1.9  1.7 - 2.4 mg/dL Final  . Ferritin 07/26/2016 284  11 - 307 ng/mL Final  . Iron 07/26/2016 7* 28 - 170 ug/dL Final  . TIBC 07/26/2016 179* 250 - 450 ug/dL Final  . Saturation Ratios 07/26/2016 4* 10.4 - 31.8 % Final  . UIBC 07/26/2016 172  ug/dL Final  . Vitamin B-12 07/26/2016 628  180 - 914 pg/mL Final   Comment: (NOTE) This assay is not validated for testing neonatal or myeloproliferative syndrome specimens for Vitamin B12 levels. Performed at [facility]   . Folate 07/26/2016 7.1  >5.9 ng/mL Final  . Sed Rate 07/26/2016 54* 0 - 30 mm/hr Final  Appointment on 07/26/2016  Component Date Value Ref Range  Status  . Specimen Description 07/27/2016 BLOOD RIGHT ARM   Final  . Special Requests 07/27/2016 BOTTLES DRAWN AEROBIC AND ANAEROBIC 10  CC   Final  . Culture 07/27/2016 NO GROWTH < 24 HOURS   Final  . Report Status 07/27/2016 PENDING   Incomplete  . Specimen Description 07/27/2016 BLOOD LEFT ARM   Final  . Special Requests 07/27/2016 BOTTLES DRAWN AEROBIC AND ANAEROBIC 10 CC   Final  . Culture 07/27/2016 NO GROWTH < 24 HOURS   Final  . Report Status 07/27/2016 PENDING   Incomplete  . Color, Urine 07/26/2016 YELLOW* YELLOW Final  . APPearance 07/26/2016 CLEAR* CLEAR Final  . Glucose, UA 07/26/2016 NEGATIVE  NEGATIVE mg/dL Final  . Bilirubin Urine 07/26/2016 NEGATIVE  NEGATIVE Final  . Ketones, ur 07/26/2016 NEGATIVE  NEGATIVE mg/dL Final  . Specific Gravity, Urine 07/26/2016 1.019  1.005 - 1.030 Final  . Hgb urine dipstick 07/26/2016 NEGATIVE  NEGATIVE Final  . pH 07/26/2016 5.0  5.0 - 8.0 Final  . Protein, ur 07/26/2016 100* NEGATIVE mg/dL Final  . Nitrite 07/26/2016 NEGATIVE  NEGATIVE Final  . Leukocytes, UA 07/26/2016 NEGATIVE  NEGATIVE Final  . RBC / HPF 07/26/2016 0-5  0 - 5 RBC/hpf Final  . WBC, UA 07/26/2016 0-5  0 - 5 WBC/hpf Final  . Bacteria, UA 07/26/2016 NONE SEEN  NONE SEEN Final  . Squamous Epithelial / LPF 07/26/2016 0-5* NONE SEEN Final  . Mucous 07/26/2016 PRESENT   Final  . Hyaline Casts, UA 07/26/2016 PRESENT   Final  . Ca Oxalate Crys, UA 07/26/2016 PRESENT   Final  . Specimen Description 07/27/2016 URINE, CLEAN CATCH   Final  . Special Requests 07/27/2016 NONE   Final  . Culture 07/27/2016 20,000 COLONIES/mL KLEBSIELLA PNEUMONIAE*  Final  . Report Status 07/27/2016 PENDING   Incomplete    No visits with results within 3 Day(s) from this visit.  Latest known visit with results is:  Admission on 06/21/2016, Discharged on 06/21/2016  Component Date Value Ref Range Status  . SURGICAL PATHOLOGY 06/27/2016    Final                   Value:Surgical Pathology CASE: [id number] PATIENT: Sandra Landry Surgical Pathology Report     SPECIMEN SUBMITTED: A. Recto-sigmoid mass; cbx  CLINICAL  HISTORY: None provided  PRE-OPERATIVE DIAGNOSIS: Right pelvic retroperitoneal mass.  Needs biopsy  POST-OPERATIVE DIAGNOSIS: Invasive peri-rectal mass     DIAGNOSIS: A. RECTOSIGMOID MASS; COLD BIOPSY: - UROTHELIAL CARCINOMA UNDERMINING COLONIC MUCOSA.  Comment:  A limited panel of immunohistochemical stains was performed. The neoplastic cells have the following immunoreactivity: Super pancytokeratin: Positive p40: Positive GATA-3: Positive SOX-1: Negative CD45: Negative Vimentin: Negative DOG-1: Negative PAX-8: Negative CDX-2: Negative p16: Negative (high background) These findings support the above diagnosis. These results were communicated to Dr. Mike Gip on 06/27/2016. Stain controls worked appropriately.  GROSS DESCRIPTION: A. Labeled: rectosigmoid mass C BX Tissue fragment(s): multiple S                         ize: aggregate, 0.8 x 0.6 x 0.1 cm Description: tan fragments  Entirely submitted in 1 cassette(s).  Final Diagnosis performed by Quay Burow, MD.  Electronically signed 06/27/2016 4:00:48PM    The electronic signature indicates that the named Attending Pathologist has evaluated the specimen  Technical component performed at [facility], [address], [place], [place] [address] Lab: [phone] Dir: Darrick Penna. Evette Doffing, MD  Professional component performed at [facility],  [facility], [place], [place], [place] [address] Lab: [phone] Dir: Dellia Nims. Reuel Derby, MD      Assessment:  Sandra Landry is a 65 y.o. 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  She has a history of lower abdomen for 2 years felt secondary to irritable bowel syndrome.  She has had RLQ pain which has extended to her right buttock and hip for 3 months.  She has lost 38 pounds in the past year.  Lower endoscopic ultrasound on 06/21/2016 revealed an infiltrative 5.6 x 4.5 cm non-obstructing large mass in the recto-sigmoid colon  extendiing from 14 cm to 18 cm from the anal verge. The mass was non-circumferential.  Biopsies revealed a urothelial carcinoma.  Pathologic stage was T4NxM0.  Abdominal and pelvic CT scan on 05/14/2016 revealed a 5.7 cm right lower quadrant retroperitoneal mass highly concerning for malignancy. The mass involved the sigmoid colon but is favored to reflect a primary retroperitoneal tumor (such as sarcoma) over a primary colonic mass.  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  There were multiple subcentimeter liver lesions, indeterminate.   PET scan on 06/06/2016 revealed hypermetabolism (SUV 19.7)  corresponding to the right lower quadrant retroperitoneal mass. It was felt intimately associated with the rectosigmoid colon, but originating outside of the colon. There was similar right-sided hydroureteronephrosis due to the right lower quadrant mass.  There was a 4 mm nodule in the left lower lobe (unclear significance).  Colonoscopy at [facility] on 07/18/2015 revealed 3 adenomatous polyps.  She underwent right nephrostomy tube placement on 07/11/2016  She has a normocytic anemia.  She has had some blood from her nephrostomy tube.  Diet is poor.  Symptomatically, she continues to lose weight.  She has sweats, but no fevers.  She is orthostatic today.    Plan: 1.  Labs today:  CBC with diff, CMP, Mg, ferritin, iron studies, B12, folate, ESR. 2.  Discuss current medical issues.  Discuss awaiting approval for chemotherapy.  Discuss chemotherapy with cisplatin and gemcitabine for 4 cycles. Discuss each cycle as 2 or 3 weeks on and 1 week off depending on blood counts and tolerance.  Discuss renal function. Secondary performance status and marginal renal function, discuss splitting dose of cisplatin between 2 days (days 1-2).  Discuss initiation of chemotherapy on 07/30/2016. Side effects were reviewed including myelosuppression, nausea, vomiting, renal  dysfunction and electrolyte issues.   Discuss importance of hydration.  Discuss importance of caloric intake. Discuss initiation of Megace. Potential side effects were reviewed including hyperglycemia, hypertension and thrombosis.  Discuss elevated white count which may be secondary to her underlying malignancy.  Given her elevated WBC count and sweats, discuss a fever workup. She'll have a chest x-ray, blood and urine cultures today.  2.  No chemotherapy today 3.  Orthostatics + IVF today (NS). 4.  Fever work-up (CXR, UA and culture, blood cultures) 5.  Rx:  Megace 200 mg (5 ml) po q day; dis:  240 ml. 6.  Contact Dr. Erlene Quan- done. 7.  RTC on 07/30/2016 for MD assessment, labs (CBC with diff, CMP, Mg) and cisplatin split dose (11/13 and 11/14) + gemcitabine on 07/30/2016.   Lequita Asal, MD 07/26/2016, 10:04 AM

## 2016-07-27 ENCOUNTER — Other Ambulatory Visit: Payer: Self-pay | Admitting: *Deleted

## 2016-07-27 DIAGNOSIS — C661 Malignant neoplasm of right ureter: Secondary | ICD-10-CM

## 2016-07-28 ENCOUNTER — Other Ambulatory Visit: Payer: Self-pay | Admitting: Hematology and Oncology

## 2016-07-28 ENCOUNTER — Telehealth: Payer: Self-pay | Admitting: Hematology and Oncology

## 2016-07-28 ENCOUNTER — Encounter: Payer: Self-pay | Admitting: Hematology and Oncology

## 2016-07-28 DIAGNOSIS — C661 Malignant neoplasm of right ureter: Secondary | ICD-10-CM

## 2016-07-28 LAB — URINE CULTURE: Culture: 20000 — AB

## 2016-07-28 MED ORDER — CIPROFLOXACIN HCL 250 MG PO TABS: 250.0000 mg | ORAL_TABLET | Freq: Two times a day (BID) | ORAL | 0 refills | Status: DC

## 2016-07-28 NOTE — Telephone Encounter (Signed)
Re:  Urine culture  Spoke with Patsy regarding Urine culture with 20,000 colonies/ml Klebsiella pneumonia (< 100,000 colonies/ml).   No UTI symptoms. Urinalysis not suggestive of UTI.  Urine obtained from clean catch.  Patient has previously been on ciprofloxacin and tolerated well.  With upcoming chemotherapy and possible myelosuppression, discussed plan for ciprofloxacin 250 mg BID x 7 days. Will recheck urinalysis next week.  Lequita Asal, MD

## 2016-07-30 ENCOUNTER — Inpatient Hospital Stay (HOSPITAL_BASED_OUTPATIENT_CLINIC_OR_DEPARTMENT_OTHER): Payer: Managed Care, Other (non HMO) | Admitting: Hematology and Oncology

## 2016-07-30 ENCOUNTER — Other Ambulatory Visit: Payer: Self-pay | Admitting: Hematology and Oncology

## 2016-07-30 ENCOUNTER — Inpatient Hospital Stay: Payer: Managed Care, Other (non HMO)

## 2016-07-30 ENCOUNTER — Encounter: Payer: Self-pay | Admitting: Hematology and Oncology

## 2016-07-30 VITALS — BP 95/60 | HR 78 | Temp 95.3°F | Resp 18 | Wt 131.0 lb

## 2016-07-30 DIAGNOSIS — K769 Liver disease, unspecified: Secondary | ICD-10-CM

## 2016-07-30 DIAGNOSIS — D538 Other specified nutritional anemias: Secondary | ICD-10-CM

## 2016-07-30 DIAGNOSIS — J150 Pneumonia due to Klebsiella pneumoniae: Secondary | ICD-10-CM | POA: Diagnosis not present

## 2016-07-30 DIAGNOSIS — M797 Fibromyalgia: Secondary | ICD-10-CM

## 2016-07-30 DIAGNOSIS — J45909 Unspecified asthma, uncomplicated: Secondary | ICD-10-CM

## 2016-07-30 DIAGNOSIS — I499 Cardiac arrhythmia, unspecified: Secondary | ICD-10-CM

## 2016-07-30 DIAGNOSIS — B1920 Unspecified viral hepatitis C without hepatic coma: Secondary | ICD-10-CM

## 2016-07-30 DIAGNOSIS — R634 Abnormal weight loss: Secondary | ICD-10-CM

## 2016-07-30 DIAGNOSIS — C661 Malignant neoplasm of right ureter: Secondary | ICD-10-CM

## 2016-07-30 DIAGNOSIS — F329 Major depressive disorder, single episode, unspecified: Secondary | ICD-10-CM

## 2016-07-30 DIAGNOSIS — M129 Arthropathy, unspecified: Secondary | ICD-10-CM

## 2016-07-30 DIAGNOSIS — M5030 Other cervical disc degeneration, unspecified cervical region: Secondary | ICD-10-CM

## 2016-07-30 DIAGNOSIS — Z79899 Other long term (current) drug therapy: Secondary | ICD-10-CM

## 2016-07-30 DIAGNOSIS — D72829 Elevated white blood cell count, unspecified: Secondary | ICD-10-CM

## 2016-07-30 DIAGNOSIS — Z5111 Encounter for antineoplastic chemotherapy: Secondary | ICD-10-CM | POA: Diagnosis not present

## 2016-07-30 DIAGNOSIS — J449 Chronic obstructive pulmonary disease, unspecified: Secondary | ICD-10-CM

## 2016-07-30 DIAGNOSIS — R630 Anorexia: Secondary | ICD-10-CM

## 2016-07-30 DIAGNOSIS — D649 Anemia, unspecified: Secondary | ICD-10-CM

## 2016-07-30 DIAGNOSIS — R000 Tachycardia, unspecified: Secondary | ICD-10-CM

## 2016-07-30 DIAGNOSIS — R1031 Right lower quadrant pain: Secondary | ICD-10-CM

## 2016-07-30 DIAGNOSIS — R531 Weakness: Secondary | ICD-10-CM

## 2016-07-30 DIAGNOSIS — K589 Irritable bowel syndrome without diarrhea: Secondary | ICD-10-CM

## 2016-07-30 DIAGNOSIS — R5383 Other fatigue: Secondary | ICD-10-CM

## 2016-07-30 DIAGNOSIS — K219 Gastro-esophageal reflux disease without esophagitis: Secondary | ICD-10-CM

## 2016-07-30 DIAGNOSIS — F419 Anxiety disorder, unspecified: Secondary | ICD-10-CM

## 2016-07-30 LAB — COMPREHENSIVE METABOLIC PANEL
ALT: 7 U/L — ABNORMAL LOW (ref 14–54)
AST: 15 U/L (ref 15–41)
Albumin: 3 g/dL — ABNORMAL LOW (ref 3.5–5.0)
Alkaline Phosphatase: 74 U/L (ref 38–126)
Anion gap: 9 (ref 5–15)
BUN: 16 mg/dL (ref 6–20)
CO2: 24 mmol/L (ref 22–32)
Calcium: 9.3 mg/dL (ref 8.9–10.3)
Chloride: 100 mmol/L — ABNORMAL LOW (ref 101–111)
Creatinine, Ser: 0.92 mg/dL (ref 0.44–1.00)
GFR calc Af Amer: >60 mL/min (ref >60)
GFR calc non Af Amer: >60 mL/min (ref >60)
Glucose, Bld: 109 mg/dL — ABNORMAL HIGH (ref 65–99)
Potassium: 4 mmol/L (ref 3.5–5.1)
Sodium: 133 mmol/L — ABNORMAL LOW (ref 135–145)
Total Bilirubin: 0.2 mg/dL — ABNORMAL LOW (ref 0.3–1.2)
Total Protein: 6.6 g/dL (ref 6.5–8.1)

## 2016-07-30 LAB — CBC WITH DIFFERENTIAL/PLATELET
Basophils Absolute: 0.1 10*3/uL (ref 0–0.1)
Basophils Relative: 0 %
Eosinophils Absolute: 0.2 10*3/uL (ref 0–0.7)
Eosinophils Relative: 1 %
HCT: 23.5 % — ABNORMAL LOW (ref 35.0–47.0)
Hemoglobin: 7.8 g/dL — ABNORMAL LOW (ref 12.0–16.0)
Lymphocytes Relative: 12 %
Lymphs Abs: 2.6 10*3/uL (ref 1.0–3.6)
MCH: 25.8 pg — ABNORMAL LOW (ref 26.0–34.0)
MCHC: 33 g/dL (ref 32.0–36.0)
MCV: 78.2 fL — ABNORMAL LOW (ref 80.0–100.0)
Monocytes Absolute: 1.2 10*3/uL — ABNORMAL HIGH (ref 0.2–0.9)
Monocytes Relative: 5 %
Neutro Abs: 19 10*3/uL — ABNORMAL HIGH (ref 1.4–6.5)
Neutrophils Relative %: 82 %
Platelets: 384 10*3/uL (ref 150–440)
RBC: 3.01 MIL/uL — ABNORMAL LOW (ref 3.80–5.20)
RDW: 16.4 % — ABNORMAL HIGH (ref 11.5–14.5)
WBC: 23 10*3/uL — ABNORMAL HIGH (ref 3.6–11.0)

## 2016-07-30 LAB — MAGNESIUM: Magnesium: 2.2 mg/dL (ref 1.7–2.4)

## 2016-07-30 MED ORDER — POTASSIUM CHLORIDE 2 MEQ/ML IV SOLN
Freq: Once | INTRAVENOUS | Status: AC
  Administered 2016-07-30: 12:00:00 via INTRAVENOUS
  Filled 2016-07-30: qty 1000

## 2016-07-30 MED ORDER — SODIUM CHLORIDE 0.9 % IV SOLN
1600.0000 mg | Freq: Once | INTRAVENOUS | Status: AC
  Administered 2016-07-30: 1600 mg via INTRAVENOUS
  Filled 2016-07-30: qty 26.3

## 2016-07-30 MED ORDER — PALONOSETRON HCL INJECTION 0.25 MG/5ML
0.2500 mg | Freq: Once | INTRAVENOUS | Status: AC
  Administered 2016-07-30: 0.25 mg via INTRAVENOUS
  Filled 2016-07-30: qty 5

## 2016-07-30 MED ORDER — LORAZEPAM 0.5 MG PO TABS
0.5000 mg | ORAL_TABLET | Freq: Four times a day (QID) | ORAL | 0 refills | Status: DC | PRN
Start: 2016-07-30 — End: 2016-09-24

## 2016-07-30 MED ORDER — FOSAPREPITANT DIMEGLUMINE INJECTION 150 MG
Freq: Once | INTRAVENOUS | Status: AC
  Administered 2016-07-30: 14:00:00 via INTRAVENOUS
  Filled 2016-07-30: qty 5

## 2016-07-30 MED ORDER — HEPARIN SOD (PORK) LOCK FLUSH 100 UNIT/ML IV SOLN
500.0000 [IU] | Freq: Once | INTRAVENOUS | Status: AC | PRN
  Administered 2016-07-30: 500 [IU]
  Filled 2016-07-30: qty 5

## 2016-07-30 MED ORDER — SODIUM CHLORIDE 0.9 % IV SOLN
35.0000 mg/m2 | Freq: Once | INTRAVENOUS | Status: AC
  Administered 2016-07-30: 59 mg via INTRAVENOUS
  Filled 2016-07-30: qty 59

## 2016-07-30 MED ORDER — ONDANSETRON HCL 8 MG PO TABS: 8.0000 mg | ORAL_TABLET | Freq: Two times a day (BID) | ORAL | 1 refills | Status: DC | PRN

## 2016-07-30 MED ORDER — SODIUM CHLORIDE 0.9 % IV SOLN: 1600.0000 mg | Freq: Once | INTRAVENOUS | Status: DC

## 2016-07-30 MED ORDER — DEXAMETHASONE 4 MG PO TABS: ORAL_TABLET | ORAL | 1 refills | Status: DC

## 2016-07-30 MED ORDER — SODIUM CHLORIDE 0.9 % IV SOLN
Freq: Once | INTRAVENOUS | Status: AC
  Administered 2016-07-30: 12:00:00 via INTRAVENOUS
  Filled 2016-07-30: qty 1000

## 2016-07-30 NOTE — Progress Notes (Signed)
[facility] day:  07/30/2016   Chief Complaint: Sandra Landry is a 65 y.o. female with locally advanced right ureteral cancer who is seen for assessment prior to initiation of cycle #1 neoadjuvant cisplatin and gemcitabine.  HPI: The patient was last seen in the medical oncology clinic on 07/26/2016.  At that time, she noted poor oral intake.  She was orthostatic.  She received fluids.  We discussed the importance of caloric intake.  She was given a prescription for Megace.  Urine output from her right nephrostomy was minimal.  WBC was elevated and felt secondary to her underlying malignancy.  Because of her sweats, she underwent a fever work-up.  CXR was negative.  Blood cultures were negative.  Urinalysis was negative.  Urine returned 20,000 colonies/ml Klebsiella pneumonia.  She began ciprofloxacin on 07/28/2016.  During the interim, she has felt better.  She is eating much better with Megace.  She is voiding well.  Urine output from her right nephrostomy tube has increased to 20-30 cc/day.  Her right lower back continue to hurt.  She is taking ciprofloxacin.  She denies any fever.   Past Medical History:  Diagnosis Date  . Anxiety   . Arthritis   . Asthma    mild  . Cervical disc disease   . COPD (chronic obstructive pulmonary disease) (HCC)    mild  . Depression   . Dysrhythmia    tachycardia  . Fibromyalgia   . GERD (gastroesophageal reflux disease)   . Hepatitis C     Past Surgical History:  Procedure Laterality Date  . cervical discectomy with fusion    . COLONOSCOPY  01/2016  . EUS N/A 06/21/2016   Procedure: LOWER ENDOSCOPIC ULTRASOUND (EUS);  Surgeon: Reita Cliche, MD;  Location: [facility] ENDOSCOPY;  Service: Endoscopy;  Laterality: N/A;    No family history on file.  Social History:  reports that she has never smoked. She has never used smokeless tobacco. She reports that she uses drugs, including Marijuana. She reports  that she does not drink alcohol.  She lives in [place].  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  She has also worked in the Physicist, medical.  The patient is accompanied by her partner, Sandra Landry, a nurse, today.  Allergies:  Allergies  Allergen Reactions  . Other Swelling    Dust, ragweed  . Iodides   . Tetanus Toxoids Other (See Comments)    Localized pain and swelling  . Iodine Other (See Comments) and Rash    Limited history. Occurred in 48s, at age 59-12, received contrast at public health facility in [place], while being evaluated for meningitis. Immediate rash - not itchy, "flat, red, perfect circles" over entire body. Has not received iodine contrast since.    Current Medications: Current Outpatient Prescriptions  Medication Sig Dispense Refill  . baclofen (LIORESAL) 20 MG tablet Take 20 mg by mouth 4 (four) times daily as needed for muscle spasms.     . cetirizine (ZYRTEC) 10 MG tablet Take 10 mg by mouth daily.     Marland Kitchen diltiazem (CARDIZEM CD) 120 MG 24 hr capsule Take 120 mg by mouth daily.     . diphenhydrAMINE (BENADRYL) 25 MG tablet Take 25-50 mg by mouth every 6 (six) hours as needed.    Marland Kitchen escitalopram (LEXAPRO) 20 MG tablet Take 20 mg by mouth daily.     Marland Kitchen ibuprofen (ADVIL,MOTRIN) 200 MG tablet Take 200-600 mg by mouth  every 6 (six) hours as needed for mild pain or moderate pain.    Marland Kitchen lactulose (CHRONULAC) 10 GM/15ML solution     . oxyCODONE-acetaminophen (PERCOCET/ROXICET) 5-325 MG tablet     . polyethylene glycol (MIRALAX / GLYCOLAX) packet Take 17 g by mouth daily as needed.     . prochlorperazine (COMPAZINE) 5 MG/ML injection Inject 5-10 mg into the vein as directed. Every 4 to 6 hours as needed.    . promethazine (PHENERGAN) 25 MG suppository Place 25 mg rectally as directed. Every 4 to 6 hours as needed    . ranitidine (ZANTAC) 300 MG tablet Take 300 mg by mouth daily.     . traMADol (ULTRAM) 50 MG tablet Take 50-100 mg by mouth every 4 (four) hours as needed.      . potassium chloride (K-DUR) 10 MEQ tablet Take 2 tablets (20 mEq total) by mouth daily. 28 tablet 0  . potassium chloride (MICRO-K) 10 MEQ CR capsule      No current facility-administered medications for this visit.     Review of Systems:  GENERAL:  Fatigue, but feels better.  Sweats.  No fever.  Weight loss of 38 pounds in year (weight stable since last visit). PERFORMANCE STATUS (ECOG):  2 HEENT:  No visual changes, runny nose, sore throat, mouth sores or tenderness. Lungs:  Shortness of breath with exertion.  No cough.  No hemoptysis. Cardiac:  No chest pain, palpitations, orthopnea, or PND. GI:  Appetite improved on Megace.  Chronic nausea and vomiting.  Irritable bowel.  Constipation.  No diarrhea, melena or hematochezia. GU:  Increased urine output from right urostomy tube (20-30 ml/day).  Hematuria from ostomy.  No urgency, frequency, or dysuria. Musculoskeletal:  Right sided back pain.  No joint pain.  No muscle tenderness. Extremities:  No pain or swelling. Skin:  No rashes or skin changes. Neuro:  No headache, numbness or weakness, balance or coordination issues. Endocrine:  No diabetes, thyroid issues, hot flashes or night sweats. Psych:  Hypersensitive.  PTSD.  Depression.  Anxiety. Pain:  Low back pain. Review of systems:  All other systems reviewed and found to be negative.  Physical Exam: BP 95/60 (BP Location: Left Arm, Patient Position: Standing) Comment: Orthostatic  Pulse 78   Temp (!) 95.3 F (35.2 C) (Tympanic)   Resp 18   Wt 130 lb 15.3 oz (59.4 kg)   BMI 21.14 kg/m  Blood pressure 94/62, pulse 65, temperature (!) 95.1 F (35.1 C), temperature source Tympanic, resp. rate 18, weight 136 lb 0.4 oz (61.7 kg). GENERAL:  Thin, slightly fatigued appearing woman who is sitting comfortably the exam room in no acute distress.  She walked into clinic. MENTAL STATUS:  Alert and oriented to person, place and time. HEAD:  Shaved head.  Normocephalic, atraumatic, face  symmetric, no Cushingoid features. EYES:  Pupils equal round and reactive to light and accomodation.  No conjunctivitis or scleral icterus. ENT:  Oropharynx clear without lesion.  No upper teeth.  Tongue normal. Mucous membranes moist.  RESPIRATORY:  Clear to auscultation without rales, wheezes or rhonchi. CARDIOVASCULAR:  Regular rate and rhythm without murmur, rub or gallop. CHEST WALL:  Unremarkable port-a-cath. ABDOMEN:  Soft, nontender with active bowel sounds and no hepatosplenomegaly.   BACK:  Right sided urostomy with dressing in place. SKIN:  No rashes, ulcers or lesions. EXTREMITIES: No edema, no skin discoloration or tenderness.  No palpable cords. LYMPH NODES: No palpable cervical, supraclavicular, axillary or inguinal adenopathy  NEUROLOGICAL: Unremarkable. PSYCH:  Appropriate.   Office Visit on 07/30/2016  Component Date Value Ref Range Status  . Magnesium 07/30/2016 2.2  1.7 - 2.4 mg/dL Final  . Sodium 07/30/2016 133* 135 - 145 mmol/L Final  . Potassium 07/30/2016 4.0  3.5 - 5.1 mmol/L Final  . Chloride 07/30/2016 100* 101 - 111 mmol/L Final  . CO2 07/30/2016 24  22 - 32 mmol/L Final  . Glucose, Bld 07/30/2016 109* 65 - 99 mg/dL Final  . BUN 07/30/2016 16  6 - 20 mg/dL Final  . Creatinine, Ser 07/30/2016 0.92  0.44 - 1.00 mg/dL Final  . Calcium 07/30/2016 9.3  8.9 - 10.3 mg/dL Final  . Total Protein 07/30/2016 6.6  6.5 - 8.1 g/dL Final  . Albumin 07/30/2016 3.0* 3.5 - 5.0 g/dL Final  . AST 07/30/2016 15  15 - 41 U/L Final  . ALT 07/30/2016 7* 14 - 54 U/L Final  . Alkaline Phosphatase 07/30/2016 74  38 - 126 U/L Final  . Total Bilirubin 07/30/2016 0.2* 0.3 - 1.2 mg/dL Final  . GFR calc non Af Amer 07/30/2016 >60  >60 mL/min Final  . GFR calc Af Amer 07/30/2016 >60  >60 mL/min Final   Comment: (NOTE) The eGFR has been calculated using the CKD EPI equation. This calculation has not been validated in all clinical situations. eGFR's persistently <60 mL/min signify  possible Chronic Kidney Disease.   . Anion gap 07/30/2016 9  5 - 15 Final  . WBC 07/30/2016 23.0* 3.6 - 11.0 K/uL Final  . RBC 07/30/2016 3.01* 3.80 - 5.20 MIL/uL Final  . Hemoglobin 07/30/2016 7.8* 12.0 - 16.0 g/dL Final  . HCT 07/30/2016 23.5* 35.0 - 47.0 % Final  . MCV 07/30/2016 78.2* 80.0 - 100.0 fL Final  . MCH 07/30/2016 25.8* 26.0 - 34.0 pg Final  . MCHC 07/30/2016 33.0  32.0 - 36.0 g/dL Final  . RDW 07/30/2016 16.4* 11.5 - 14.5 % Final  . Platelets 07/30/2016 384  150 - 440 K/uL Final  . Neutrophils Relative % 07/30/2016 82  % Final  . Neutro Abs 07/30/2016 19.0* 1.4 - 6.5 K/uL Final  . Lymphocytes Relative 07/30/2016 12  % Final  . Lymphs Abs 07/30/2016 2.6  1.0 - 3.6 K/uL Final  . Monocytes Relative 07/30/2016 5  % Final  . Monocytes Absolute 07/30/2016 1.2* 0.2 - 0.9 K/uL Final  . Eosinophils Relative 07/30/2016 1  % Final  . Eosinophils Absolute 07/30/2016 0.2  0 - 0.7 K/uL Final  . Basophils Relative 07/30/2016 0  % Final  . Basophils Absolute 07/30/2016 0.1  0 - 0.1 K/uL Final    No visits with results within 3 Day(s) from this visit.  Latest known visit with results is:  Admission on 06/21/2016, Discharged on 06/21/2016  Component Date Value Ref Range Status  . SURGICAL PATHOLOGY 06/27/2016    Final                   Value:Surgical Pathology CASE: [id number] PATIENT: Rosi Passarelli Surgical Pathology Report     SPECIMEN SUBMITTED: A. Recto-sigmoid mass; cbx  CLINICAL HISTORY: None provided  PRE-OPERATIVE DIAGNOSIS: Right pelvic retroperitoneal mass.  Needs biopsy  POST-OPERATIVE DIAGNOSIS: Invasive peri-rectal mass     DIAGNOSIS: A. RECTOSIGMOID MASS; COLD BIOPSY: - UROTHELIAL CARCINOMA UNDERMINING COLONIC MUCOSA.  Comment:  A limited panel of immunohistochemical stains was performed. The neoplastic cells have the following immunoreactivity: Super pancytokeratin: Positive p40: Positive GATA-3: Positive SOX-1: Negative CD45:  Negative Vimentin: Negative DOG-1: Negative PAX-8: Negative CDX-2:  Negative p16: Negative (high background) These findings support the above diagnosis. These results were communicated to Dr. Mike Gip on 06/27/2016. Stain controls worked appropriately.  GROSS DESCRIPTION: A. Labeled: rectosigmoid mass C BX Tissue fragment(s): multiple S                         ize: aggregate, 0.8 x 0.6 x 0.1 cm Description: tan fragments  Entirely submitted in 1 cassette(s).  Final Diagnosis performed by Quay Burow, MD.  Electronically signed 06/27/2016 4:00:48PM    The electronic signature indicates that the named Attending Pathologist has evaluated the specimen  Technical component performed at [facility], [address], [place], [place] [address] Lab: [phone] Dir: Darrick Penna. Evette Doffing, MD  Professional component performed at [facility], [facility], [place], [place], [place] [address] Lab: [phone] Dir: Dellia Nims. Reuel Derby, MD      Assessment:  Sandra Landry is a 65 y.o. 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  She has a history of lower abdomen for 2 years felt secondary to irritable bowel syndrome.  She has had RLQ pain which has extended to her right buttock and hip for 3 months.  She has lost 38 pounds in the past year.  Lower endoscopic ultrasound on 06/21/2016 revealed an infiltrative 5.6 x 4.5 cm non-obstructing large mass in the recto-sigmoid colon extendiing from 14 cm to 18 cm from the anal verge. The mass was non-circumferential.  Biopsies revealed a urothelial carcinoma.  Pathologic stage was T4NxM0.  Abdominal and pelvic CT scan on 05/14/2016 revealed a 5.7 cm right lower quadrant retroperitoneal mass highly concerning for malignancy. The mass involved the sigmoid colon but is favored to reflect a primary retroperitoneal tumor (such as sarcoma) over a primary colonic mass.  There was chronic right ureteral obstruction by the  mass with hydronephrosis and renal atrophy.  There were multiple subcentimeter liver lesions, indeterminate.   PET scan on 06/06/2016 revealed hypermetabolism (SUV 19.7)  corresponding to the right lower quadrant retroperitoneal mass. It was felt intimately associated with the rectosigmoid colon, but originating outside of the colon. There was similar right-sided hydroureteronephrosis due to the right lower quadrant mass.  There was a 4 mm nodule in the left lower lobe (unclear significance).  Colonoscopy at [facility] on 07/18/2015 revealed 3 adenomatous polyps.  She underwent right nephrostomy tube placement on 07/11/2016  She has a normocytic anemia.  Labs on 07/26/2016 revealed a ferritin (284), iron saturation (4%), TIBC 179 (low) B12 (628), folate (7.1), and ESR (54).  She has had some blood from her nephrostomy tube.  She has anemia of chronic disease.  Diet is poor.  She has leukocytosis secondary to her underlying malignancy.  Work-up on 07/26/2016 revealed a negative Urinalysis, blood cultures, and CXR.  Urinalysis revealed 20,000 colonies/ml Klebsiella pneumonia.  She began ciprofloxacin on 07/28/2016.  Prior to treatment, she lost 35+ pounds.  She began Megace on 07/26/2016.  She is eating better.  Weight is stable.  Symptomatically, she feels better.  Exam is unremarkable.  Hematocrit is 23.5.  Plan: 1.  Labs today:  CBC with diff, CMP, Mg, hold tube. 2.  Discuss plan for split dose cisplatin.  Recheck labs tomorrow.  Discuss importance of hydration. 3.  Discuss caloric intake.  Discuss oral iron and iron rich foods. 4.  Discuss anemia.  Discuss consideration of transfusion.  Patient agreeable. 5.  Day 1 of cycle #1 cisplatin and gemcitabine today. 6.  RTC on  07/31/2016 for labs (BMP, Mg) and day 2 cisplatin. 7.  RTC in [place] on 08/01/2016 for 1 unit of PRBCs. 8.  RTC on 08/06/2016 for MD assessment, labs (CBC with diff, CMP, Mg), and day 8 gemcitabine.   Lequita Asal,  MD 07/30/2016, 10:27 AM

## 2016-07-31 ENCOUNTER — Inpatient Hospital Stay: Payer: Managed Care, Other (non HMO)

## 2016-07-31 ENCOUNTER — Other Ambulatory Visit: Payer: Self-pay | Admitting: *Deleted

## 2016-07-31 ENCOUNTER — Telehealth: Payer: Self-pay | Admitting: *Deleted

## 2016-07-31 VITALS — BP 115/65 | HR 78 | Temp 95.2°F | Resp 18

## 2016-07-31 DIAGNOSIS — D649 Anemia, unspecified: Secondary | ICD-10-CM

## 2016-07-31 DIAGNOSIS — C661 Malignant neoplasm of right ureter: Secondary | ICD-10-CM

## 2016-07-31 DIAGNOSIS — R634 Abnormal weight loss: Secondary | ICD-10-CM

## 2016-07-31 DIAGNOSIS — Z5111 Encounter for antineoplastic chemotherapy: Secondary | ICD-10-CM | POA: Diagnosis not present

## 2016-07-31 DIAGNOSIS — D72829 Elevated white blood cell count, unspecified: Secondary | ICD-10-CM

## 2016-07-31 LAB — CBC WITH DIFFERENTIAL/PLATELET
Basophils Absolute: 0 10*3/uL (ref 0–0.1)
Basophils Relative: 0 %
Eosinophils Absolute: 0 10*3/uL (ref 0–0.7)
Eosinophils Relative: 0 %
HCT: 20.9 % — ABNORMAL LOW (ref 35.0–47.0)
Hemoglobin: 6.7 g/dL — ABNORMAL LOW (ref 12.0–16.0)
Lymphocytes Relative: 4 %
Lymphs Abs: 0.8 10*3/uL — ABNORMAL LOW (ref 1.0–3.6)
MCH: 25.3 pg — ABNORMAL LOW (ref 26.0–34.0)
MCHC: 32 g/dL (ref 32.0–36.0)
MCV: 79.1 fL — ABNORMAL LOW (ref 80.0–100.0)
Monocytes Absolute: 0.3 10*3/uL (ref 0.2–0.9)
Monocytes Relative: 1 %
Neutro Abs: 21 10*3/uL — ABNORMAL HIGH (ref 1.4–6.5)
Neutrophils Relative %: 95 %
Platelets: 290 10*3/uL (ref 150–440)
RBC: 2.64 MIL/uL — ABNORMAL LOW (ref 3.80–5.20)
RDW: 16.4 % — ABNORMAL HIGH (ref 11.5–14.5)
WBC: 22.1 10*3/uL — ABNORMAL HIGH (ref 3.6–11.0)

## 2016-07-31 LAB — BASIC METABOLIC PANEL
Anion gap: 6 (ref 5–15)
BUN: 21 mg/dL — ABNORMAL HIGH (ref 6–20)
CO2: 21 mmol/L — ABNORMAL LOW (ref 22–32)
Calcium: 8.8 mg/dL — ABNORMAL LOW (ref 8.9–10.3)
Chloride: 102 mmol/L (ref 101–111)
Creatinine, Ser: 0.92 mg/dL (ref 0.44–1.00)
GFR calc Af Amer: >60 mL/min (ref >60)
GFR calc non Af Amer: >60 mL/min (ref >60)
Glucose, Bld: 139 mg/dL — ABNORMAL HIGH (ref 65–99)
Potassium: 4.9 mmol/L (ref 3.5–5.1)
Sodium: 129 mmol/L — ABNORMAL LOW (ref 135–145)

## 2016-07-31 LAB — PREPARE RBC (CROSSMATCH)

## 2016-07-31 LAB — MAGNESIUM: Magnesium: 2.4 mg/dL (ref 1.7–2.4)

## 2016-07-31 LAB — CULTURE, BLOOD (ROUTINE X 2)
Culture: NO GROWTH
Culture: NO GROWTH

## 2016-07-31 LAB — ABO/RH: ABO/RH(D): A NEG

## 2016-07-31 LAB — SAMPLE TO BLOOD BANK

## 2016-07-31 MED ORDER — SODIUM CHLORIDE 0.9 % IV SOLN
35.0000 mg/m2 | Freq: Once | INTRAVENOUS | Status: AC
  Administered 2016-07-31: 59 mg via INTRAVENOUS
  Filled 2016-07-31: qty 59

## 2016-07-31 MED ORDER — MANNITOL 25 % IV SOLN
Freq: Once | INTRAVENOUS | Status: AC
  Administered 2016-07-31: 12:00:00 via INTRAVENOUS
  Filled 2016-07-31: qty 1000

## 2016-07-31 MED ORDER — DEXAMETHASONE SODIUM PHOSPHATE 10 MG/ML IJ SOLN
10.0000 mg | Freq: Once | INTRAMUSCULAR | Status: AC
  Administered 2016-07-31: 10 mg via INTRAVENOUS
  Filled 2016-07-31: qty 1

## 2016-07-31 MED ORDER — SODIUM CHLORIDE 0.9 % IV SOLN
Freq: Once | INTRAVENOUS | Status: AC
  Administered 2016-07-31: 09:00:00 via INTRAVENOUS
  Filled 2016-07-31: qty 1000

## 2016-07-31 MED ORDER — SODIUM CHLORIDE 0.9 % IV SOLN: 10.0000 mg | Freq: Once | INTRAVENOUS | Status: DC

## 2016-07-31 MED ORDER — MANNITOL 25 % IV SOLN
Freq: Once | INTRAVENOUS | Status: AC
  Administered 2016-07-31: 10:00:00 via INTRAVENOUS
  Filled 2016-07-31: qty 1000

## 2016-07-31 MED ORDER — HEPARIN SOD (PORK) LOCK FLUSH 100 UNIT/ML IV SOLN
500.0000 [IU] | Freq: Once | INTRAVENOUS | Status: AC | PRN
  Administered 2016-07-31: 500 [IU]
  Filled 2016-07-31: qty 5

## 2016-07-31 MED ORDER — SODIUM CHLORIDE 0.9% FLUSH
10.0000 mL | INTRAVENOUS | Status: DC | PRN
  Administered 2016-07-31: 10 mL
  Filled 2016-07-31: qty 10

## 2016-07-31 NOTE — Telephone Encounter (Signed)
Called back in the infusion to let them know, we want to wait on labs before deciding about what kind of fluids for pre meds.  MD concerned that about her K and Mag was normal yest and she got pre and post fluids yest that has k and mag in it.  Labs checked today and k-4.9, mag -2.4.  Based on these values. Mike Gip has asked to give plain fluids. I spoke to [facility] in pharmacy and instead of d 5 1/2 with mannitol and k and mag. It will only be  D  5 1 /2 with mannitol only. Leslie infusion nurse told about the change and she will hang the bag when ready from pharmacy

## 2016-07-31 NOTE — Telephone Encounter (Signed)
inquiring if the Zofran got called in. Per chart it was sent yesterday. Advised it was sent

## 2016-08-01 ENCOUNTER — Inpatient Hospital Stay: Payer: Managed Care, Other (non HMO)

## 2016-08-01 DIAGNOSIS — D649 Anemia, unspecified: Secondary | ICD-10-CM

## 2016-08-01 DIAGNOSIS — Z5111 Encounter for antineoplastic chemotherapy: Secondary | ICD-10-CM | POA: Diagnosis not present

## 2016-08-01 MED ORDER — ACETAMINOPHEN 325 MG PO TABS
650.0000 mg | ORAL_TABLET | Freq: Once | ORAL | Status: AC
  Administered 2016-08-01: 650 mg via ORAL
  Filled 2016-08-01: qty 2

## 2016-08-01 MED ORDER — SODIUM CHLORIDE 0.9 % IV SOLN
250.0000 mL | Freq: Once | INTRAVENOUS | Status: AC
  Administered 2016-08-01: 250 mL via INTRAVENOUS
  Filled 2016-08-01: qty 250

## 2016-08-01 MED ORDER — HEPARIN SOD (PORK) LOCK FLUSH 100 UNIT/ML IV SOLN
500.0000 [IU] | Freq: Every day | INTRAVENOUS | Status: AC | PRN
  Administered 2016-08-01: 500 [IU]
  Filled 2016-08-01: qty 5

## 2016-08-01 MED ORDER — DIPHENHYDRAMINE HCL 25 MG PO CAPS
25.0000 mg | ORAL_CAPSULE | Freq: Once | ORAL | Status: AC
  Administered 2016-08-01: 25 mg via ORAL
  Filled 2016-08-01: qty 1

## 2016-08-01 MED ORDER — SODIUM CHLORIDE 0.9% FLUSH
10.0000 mL | INTRAVENOUS | Status: AC | PRN
Start: 2016-08-01 — End: 2016-08-01
  Administered 2016-08-01: 10 mL
  Filled 2016-08-01: qty 10

## 2016-08-02 LAB — TYPE AND SCREEN
ABO/RH(D): A NEG
Antibody Screen: NEGATIVE
Unit division: 0

## 2016-08-03 ENCOUNTER — Telehealth: Payer: Self-pay | Admitting: Urology

## 2016-08-03 DIAGNOSIS — N135 Crossing vessel and stricture of ureter without hydronephrosis: Secondary | ICD-10-CM

## 2016-08-03 NOTE — Telephone Encounter (Signed)
Called patient to discuss nephrostomy tube.    Despite low urine output from nephrostomy, her Cr has improved which is somewhat unexpected.   She has been dealing with a leukocytosis and urine culture grew 10,000 colonies of Klebsiella, now on Cipro. She is having some irritation at the nephrostomy tube site but otherwise is tolerating it. She is no longer getting up at night to void as frequently and is pleased with this.  At this point in time, given that she has an active urinary tract infection and her creatinine is normalized, I would recommend leaving the nephrostomy tube in place an open draining.  We will arrange for nephrostomy tube exchange in about 4 weeks.  At some point, may consider clamping trial of the tube to see how her creatinine responds but prefer to defer this for the time being.  She is agreeable with this plan. She was offered a visit to discuss this further but declined. She will call our office as needed.  Hollice Espy, MD

## 2016-08-06 ENCOUNTER — Other Ambulatory Visit: Payer: Self-pay | Admitting: Hematology and Oncology

## 2016-08-06 ENCOUNTER — Encounter: Payer: Self-pay | Admitting: Hematology and Oncology

## 2016-08-06 ENCOUNTER — Inpatient Hospital Stay (HOSPITAL_BASED_OUTPATIENT_CLINIC_OR_DEPARTMENT_OTHER): Payer: Managed Care, Other (non HMO) | Admitting: Hematology and Oncology

## 2016-08-06 ENCOUNTER — Inpatient Hospital Stay: Payer: Managed Care, Other (non HMO)

## 2016-08-06 VITALS — BP 124/75 | HR 60 | Temp 96.5°F | Wt 131.6 lb

## 2016-08-06 DIAGNOSIS — R8290 Unspecified abnormal findings in urine: Secondary | ICD-10-CM

## 2016-08-06 DIAGNOSIS — K589 Irritable bowel syndrome without diarrhea: Secondary | ICD-10-CM

## 2016-08-06 DIAGNOSIS — R634 Abnormal weight loss: Secondary | ICD-10-CM

## 2016-08-06 DIAGNOSIS — F419 Anxiety disorder, unspecified: Secondary | ICD-10-CM

## 2016-08-06 DIAGNOSIS — I499 Cardiac arrhythmia, unspecified: Secondary | ICD-10-CM

## 2016-08-06 DIAGNOSIS — B1920 Unspecified viral hepatitis C without hepatic coma: Secondary | ICD-10-CM

## 2016-08-06 DIAGNOSIS — D649 Anemia, unspecified: Secondary | ICD-10-CM

## 2016-08-06 DIAGNOSIS — Z79899 Other long term (current) drug therapy: Secondary | ICD-10-CM

## 2016-08-06 DIAGNOSIS — C661 Malignant neoplasm of right ureter: Secondary | ICD-10-CM

## 2016-08-06 DIAGNOSIS — Z5111 Encounter for antineoplastic chemotherapy: Secondary | ICD-10-CM | POA: Diagnosis not present

## 2016-08-06 DIAGNOSIS — J45909 Unspecified asthma, uncomplicated: Secondary | ICD-10-CM

## 2016-08-06 DIAGNOSIS — D72829 Elevated white blood cell count, unspecified: Secondary | ICD-10-CM

## 2016-08-06 DIAGNOSIS — J159 Unspecified bacterial pneumonia: Secondary | ICD-10-CM | POA: Diagnosis not present

## 2016-08-06 DIAGNOSIS — E871 Hypo-osmolality and hyponatremia: Secondary | ICD-10-CM

## 2016-08-06 DIAGNOSIS — R5383 Other fatigue: Secondary | ICD-10-CM

## 2016-08-06 DIAGNOSIS — R630 Anorexia: Secondary | ICD-10-CM

## 2016-08-06 DIAGNOSIS — M545 Low back pain: Secondary | ICD-10-CM

## 2016-08-06 DIAGNOSIS — E876 Hypokalemia: Secondary | ICD-10-CM

## 2016-08-06 DIAGNOSIS — N1330 Unspecified hydronephrosis: Secondary | ICD-10-CM

## 2016-08-06 DIAGNOSIS — R000 Tachycardia, unspecified: Secondary | ICD-10-CM

## 2016-08-06 DIAGNOSIS — M797 Fibromyalgia: Secondary | ICD-10-CM

## 2016-08-06 DIAGNOSIS — R109 Unspecified abdominal pain: Secondary | ICD-10-CM

## 2016-08-06 DIAGNOSIS — D638 Anemia in other chronic diseases classified elsewhere: Secondary | ICD-10-CM | POA: Diagnosis not present

## 2016-08-06 DIAGNOSIS — K769 Liver disease, unspecified: Secondary | ICD-10-CM

## 2016-08-06 DIAGNOSIS — F329 Major depressive disorder, single episode, unspecified: Secondary | ICD-10-CM

## 2016-08-06 DIAGNOSIS — M5030 Other cervical disc degeneration, unspecified cervical region: Secondary | ICD-10-CM

## 2016-08-06 DIAGNOSIS — R531 Weakness: Secondary | ICD-10-CM

## 2016-08-06 DIAGNOSIS — R1031 Right lower quadrant pain: Secondary | ICD-10-CM

## 2016-08-06 DIAGNOSIS — M129 Arthropathy, unspecified: Secondary | ICD-10-CM

## 2016-08-06 DIAGNOSIS — K219 Gastro-esophageal reflux disease without esophagitis: Secondary | ICD-10-CM

## 2016-08-06 DIAGNOSIS — J449 Chronic obstructive pulmonary disease, unspecified: Secondary | ICD-10-CM

## 2016-08-06 LAB — CBC WITH DIFFERENTIAL/PLATELET
Basophils Absolute: 0 10*3/uL (ref 0–0.1)
Basophils Relative: 0 %
Eosinophils Absolute: 0.1 10*3/uL (ref 0–0.7)
Eosinophils Relative: 0 %
HCT: 28.7 % — ABNORMAL LOW (ref 35.0–47.0)
Hemoglobin: 9.4 g/dL — ABNORMAL LOW (ref 12.0–16.0)
Lymphocytes Relative: 10 %
Lymphs Abs: 2.1 10*3/uL (ref 1.0–3.6)
MCH: 25.4 pg — ABNORMAL LOW (ref 26.0–34.0)
MCHC: 32.8 g/dL (ref 32.0–36.0)
MCV: 77.2 fL — ABNORMAL LOW (ref 80.0–100.0)
Monocytes Absolute: 0.3 10*3/uL (ref 0.2–0.9)
Monocytes Relative: 2 %
Neutro Abs: 18 10*3/uL — ABNORMAL HIGH (ref 1.4–6.5)
Neutrophils Relative %: 88 %
Platelets: 154 10*3/uL (ref 150–440)
RBC: 3.72 MIL/uL — ABNORMAL LOW (ref 3.80–5.20)
RDW: 17 % — ABNORMAL HIGH (ref 11.5–14.5)
WBC: 20.5 10*3/uL — ABNORMAL HIGH (ref 3.6–11.0)

## 2016-08-06 LAB — COMPREHENSIVE METABOLIC PANEL
ALT: 9 U/L — ABNORMAL LOW (ref 14–54)
AST: 19 U/L (ref 15–41)
Albumin: 2.8 g/dL — ABNORMAL LOW (ref 3.5–5.0)
Alkaline Phosphatase: 56 U/L (ref 38–126)
Anion gap: 10 (ref 5–15)
BUN: 31 mg/dL — ABNORMAL HIGH (ref 6–20)
CO2: 25 mmol/L (ref 22–32)
Calcium: 8.2 mg/dL — ABNORMAL LOW (ref 8.9–10.3)
Chloride: 94 mmol/L — ABNORMAL LOW (ref 101–111)
Creatinine, Ser: 1.5 mg/dL — ABNORMAL HIGH (ref 0.44–1.00)
GFR calc Af Amer: 42 mL/min — ABNORMAL LOW (ref >60)
GFR calc non Af Amer: 36 mL/min — ABNORMAL LOW (ref >60)
Glucose, Bld: 109 mg/dL — ABNORMAL HIGH (ref 65–99)
Potassium: 3 mmol/L — ABNORMAL LOW (ref 3.5–5.1)
Sodium: 129 mmol/L — ABNORMAL LOW (ref 135–145)
Total Bilirubin: 0.4 mg/dL (ref 0.3–1.2)
Total Protein: 5.9 g/dL — ABNORMAL LOW (ref 6.5–8.1)

## 2016-08-06 LAB — URINALYSIS COMPLETE WITH MICROSCOPIC (ARMC ONLY)
Bacteria, UA: NONE SEEN
Bilirubin Urine: NEGATIVE
Glucose, UA: 50 mg/dL — AB
Ketones, ur: NEGATIVE mg/dL
Leukocytes, UA: NEGATIVE
Nitrite: NEGATIVE
Protein, ur: 30 mg/dL — AB
Specific Gravity, Urine: 1.005 (ref 1.005–1.030)
pH: 6 (ref 5.0–8.0)

## 2016-08-06 LAB — SAMPLE TO BLOOD BANK

## 2016-08-06 LAB — MAGNESIUM: Magnesium: 1.8 mg/dL (ref 1.7–2.4)

## 2016-08-06 MED ORDER — SODIUM CHLORIDE 0.9 % IV SOLN
Freq: Once | INTRAVENOUS | Status: AC
  Administered 2016-08-06: 15:00:00 via INTRAVENOUS
  Filled 2016-08-06: qty 1000

## 2016-08-06 MED ORDER — SODIUM CHLORIDE 0.9% FLUSH
10.0000 mL | INTRAVENOUS | Status: DC | PRN
  Administered 2016-08-06: 10 mL
  Filled 2016-08-06: qty 10

## 2016-08-06 MED ORDER — HEPARIN SOD (PORK) LOCK FLUSH 100 UNIT/ML IV SOLN
500.0000 [IU] | Freq: Once | INTRAVENOUS | Status: AC | PRN
  Administered 2016-08-06: 500 [IU]

## 2016-08-06 MED ORDER — SODIUM CHLORIDE 0.9 % IV SOLN
1600.0000 mg | Freq: Once | INTRAVENOUS | Status: AC
  Administered 2016-08-06: 1600 mg via INTRAVENOUS
  Filled 2016-08-06: qty 42

## 2016-08-06 MED ORDER — POTASSIUM CHLORIDE 2 MEQ/ML IV SOLN
20.0000 meq | Freq: Once | INTRAVENOUS | Status: AC
  Administered 2016-08-06: 20 meq via INTRAVENOUS
  Filled 2016-08-06: qty 10

## 2016-08-06 MED ORDER — POTASSIUM CHLORIDE 20 MEQ/100ML IV SOLN
20.0000 meq | Freq: Once | INTRAVENOUS | Status: DC
  Filled 2016-08-06: qty 100

## 2016-08-06 MED ORDER — SODIUM CHLORIDE 0.9 % IV SOLN
Freq: Once | INTRAVENOUS | Status: DC
  Filled 2016-08-06: qty 1000

## 2016-08-06 MED ORDER — ONDANSETRON 8 MG PO TBDP
8.0000 mg | ORAL_TABLET | Freq: Once | ORAL | Status: AC
  Administered 2016-08-06: 8 mg via ORAL
  Filled 2016-08-06: qty 1

## 2016-08-06 MED ORDER — ONDANSETRON HCL 4 MG PO TABS: 8.0000 mg | ORAL_TABLET | Freq: Once | ORAL | Status: DC

## 2016-08-06 NOTE — Progress Notes (Signed)
[facility] day:  08/06/2016   Chief Complaint: Sandra Landry is a 65 y.o. female with locally advanced right ureteral cancer who is seen for assessment prior to day 8 of cycle #1 neoadjuvant cisplatin and gemcitabine.  HPI: The patient was last seen in the medical oncology clinic on 07/30/2016.  At that time, she had made a dramatic improvement (she walked into clinic rather than in a wheelchair).  She was eating.  Weight was stable.  Right urostomy tube was putting out more urine.  She began cycle #1 cisplatin and gemcitabine on 07/30/2016.  She received split dose cisplatin.  Hydration was modified on day 2 secondary to her potassium (4.9) and magnesium (2.4).  Creatinine was stable at 0.92.  She received 1 unit of PRBCs on 08/01/2016.  Symptomatically, she notes that her urine is a little cloudy.  She has some lower back pain and abdominal pain. She is taking Percocet 5/325 4 pills a day. She is also taking morphine 15 mg twice a day prescribed by Dr. Clemmie Krill. Bowels are moving.  She denies any diarrhea or constipation. Yesterday she had some nausea and vomiting secondary to mucous production.  She has had no fevers.   Past Medical History:  Diagnosis Date  . Anxiety   . Arthritis   . Asthma    mild  . Cervical disc disease   . COPD (chronic obstructive pulmonary disease) (HCC)    mild  . Depression   . Dysrhythmia    tachycardia  . Fibromyalgia   . GERD (gastroesophageal reflux disease)   . Hepatitis C     Past Surgical History:  Procedure Laterality Date  . cervical discectomy with fusion    . COLONOSCOPY  01/2016  . EUS N/A 06/21/2016   Procedure: LOWER ENDOSCOPIC ULTRASOUND (EUS);  Surgeon: Reita Cliche, MD;  Location: [facility] ENDOSCOPY;  Service: Endoscopy;  Laterality: N/A;    No family history on file.  Social History:  reports that she has never smoked. She has never used smokeless tobacco. She reports that she uses drugs,  including Marijuana. She reports that she does not drink alcohol.  She lives in [place].  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  She has also worked in the Physicist, medical.  The patient is accompanied by her partner, Darliss Ridgel, a nurse, today.  Allergies:  Allergies  Allergen Reactions  . Other Swelling    Dust, ragweed  . Iodides   . Tetanus Toxoids Other (See Comments)    Localized pain and swelling  . Iodine Other (See Comments) and Rash    Limited history. Occurred in 69s, at age 70-12, received contrast at public health facility in [place], while being evaluated for meningitis. Immediate rash - not itchy, "flat, red, perfect circles" over entire body. Has not received iodine contrast since.    Current Medications: Current Outpatient Prescriptions  Medication Sig Dispense Refill  . baclofen (LIORESAL) 20 MG tablet Take 20 mg by mouth 4 (four) times daily as needed for muscle spasms.     . cetirizine (ZYRTEC) 10 MG tablet Take 10 mg by mouth daily.     Marland Kitchen diltiazem (CARDIZEM CD) 120 MG 24 hr capsule Take 120 mg by mouth daily.     . diphenhydrAMINE (BENADRYL) 25 MG tablet Take 25-50 mg by mouth every 6 (six) hours as needed.    Marland Kitchen escitalopram (LEXAPRO) 20 MG tablet Take 20 mg by mouth daily.     Marland Kitchen  ibuprofen (ADVIL,MOTRIN) 200 MG tablet Take 200-600 mg by mouth every 6 (six) hours as needed for mild pain or moderate pain.    Marland Kitchen lactulose (CHRONULAC) 10 GM/15ML solution     . oxyCODONE-acetaminophen (PERCOCET/ROXICET) 5-325 MG tablet     . polyethylene glycol (MIRALAX / GLYCOLAX) packet Take 17 g by mouth daily as needed.     . prochlorperazine (COMPAZINE) 5 MG/ML injection Inject 5-10 mg into the vein as directed. Every 4 to 6 hours as needed.    . promethazine (PHENERGAN) 25 MG suppository Place 25 mg rectally as directed. Every 4 to 6 hours as needed    . ranitidine (ZANTAC) 300 MG tablet Take 300 mg by mouth daily.     . traMADol (ULTRAM) 50 MG tablet Take 50-100 mg by mouth  every 4 (four) hours as needed.     . potassium chloride (K-DUR) 10 MEQ tablet Take 2 tablets (20 mEq total) by mouth daily. 28 tablet 0  . potassium chloride (MICRO-K) 10 MEQ CR capsule      No current facility-administered medications for this visit.     Review of Systems:  GENERAL:  Feels "ok".  No fever.  Weight loss of 38 pounds in year.  Weight up 1 pound since last visit. PERFORMANCE STATUS (ECOG):  2 HEENT:  No visual changes, runny nose, sore throat, mouth sores or tenderness. Lungs:  Shortness of breath with exertion.  No cough.  No hemoptysis. Cardiac:  No chest pain, palpitations, orthopnea, or PND. GI:  Appetite improved on Megace.  Chronic nausea and vomiting.  Emesis yesterday secondary to mucus.  Irritable bowel.  No diarrhea, constipation, melena or hematochezia. GU: Urine output from right urostomy tube (25-30 ml/day).  Urine slightly cloudy.  No urgency, frequency, or dysuria. Musculoskeletal:  Back and lower abdominal pain.  No joint pain.  No muscle tenderness. Extremities:  No pain or swelling. Skin:  No rashes or skin changes. Neuro:  No headache, numbness or weakness, balance or coordination issues. Endocrine:  No diabetes, thyroid issues, hot flashes or night sweats. Psych:  Hypersensitive.  PTSD.  Depression.  Anxiety. Pain:  Low back pain (chronic). Review of systems:  All other systems reviewed and found to be negative.  Physical Exam: BP 124/75 (BP Location: Left Arm, Patient Position: Sitting)   Pulse 60   Temp (!) 96.5 F (35.8 C) (Tympanic)   Wt 131 lb 9.8 oz (59.7 kg)   BMI 21.24 kg/m  Blood pressure 94/62, pulse 65, temperature (!) 95.1 F (35.1 C), temperature source Tympanic, resp. rate 18, weight 136 lb 0.4 oz (61.7 kg). GENERAL:  Thin, slightly fatigued appearing woman who is sitting comfortably the exam room in no acute distress.  MENTAL STATUS:  Alert and oriented to person, place and time. HEAD:  Shaved head.  Normocephalic, atraumatic,  face symmetric, no Cushingoid features. EYES:  Pupils equal round and reactive to light and accomodation.  No conjunctivitis or scleral icterus. ENT:  Oropharynx clear without lesion.  No upper teeth.  Tongue normal. Mucous membranes moist.  RESPIRATORY:  Clear to auscultation without rales, wheezes or rhonchi. CARDIOVASCULAR:  Regular rate and rhythm without murmur, rub or gallop. CHEST WALL:  Unremarkable port-a-cath. ABDOMEN:  Soft, nontender with active bowel sounds and no hepatosplenomegaly.   BACK:  Right sided urostomy with dressing in place.  No CVA tenderness. SKIN:  No rashes, ulcers or lesions. EXTREMITIES: No edema, no skin discoloration or tenderness.  No palpable cords. LYMPH NODES: No palpable cervical,  supraclavicular, axillary or inguinal adenopathy  NEUROLOGICAL: Unremarkable. PSYCH:  Appropriate.   Infusion on 08/06/2016  Component Date Value Ref Range Status  . WBC 08/06/2016 20.5* 3.6 - 11.0 K/uL Final  . RBC 08/06/2016 3.72* 3.80 - 5.20 MIL/uL Final  . Hemoglobin 08/06/2016 9.4* 12.0 - 16.0 g/dL Final  . HCT 08/06/2016 28.7* 35.0 - 47.0 % Final  . MCV 08/06/2016 77.2* 80.0 - 100.0 fL Final  . MCH 08/06/2016 25.4* 26.0 - 34.0 pg Final  . MCHC 08/06/2016 32.8  32.0 - 36.0 g/dL Final  . RDW 08/06/2016 17.0* 11.5 - 14.5 % Final  . Platelets 08/06/2016 154  150 - 440 K/uL Final  . Neutrophils Relative % 08/06/2016 88  % Final  . Neutro Abs 08/06/2016 18.0* 1.4 - 6.5 K/uL Final  . Lymphocytes Relative 08/06/2016 10  % Final  . Lymphs Abs 08/06/2016 2.1  1.0 - 3.6 K/uL Final  . Monocytes Relative 08/06/2016 2  % Final  . Monocytes Absolute 08/06/2016 0.3  0.2 - 0.9 K/uL Final  . Eosinophils Relative 08/06/2016 0  % Final  . Eosinophils Absolute 08/06/2016 0.1  0 - 0.7 K/uL Final  . Basophils Relative 08/06/2016 0  % Final  . Basophils Absolute 08/06/2016 0.0  0 - 0.1 K/uL Final  . Sodium 08/06/2016 129* 135 - 145 mmol/L Final  . Potassium 08/06/2016 3.0* 3.5 - 5.1  mmol/L Final  . Chloride 08/06/2016 94* 101 - 111 mmol/L Final  . CO2 08/06/2016 25  22 - 32 mmol/L Final  . Glucose, Bld 08/06/2016 109* 65 - 99 mg/dL Final  . BUN 08/06/2016 31* 6 - 20 mg/dL Final  . Creatinine, Ser 08/06/2016 1.50* 0.44 - 1.00 mg/dL Final  . Calcium 08/06/2016 8.2* 8.9 - 10.3 mg/dL Final  . Total Protein 08/06/2016 5.9* 6.5 - 8.1 g/dL Final  . Albumin 08/06/2016 2.8* 3.5 - 5.0 g/dL Final  . AST 08/06/2016 19  15 - 41 U/L Final  . ALT 08/06/2016 9* 14 - 54 U/L Final  . Alkaline Phosphatase 08/06/2016 56  38 - 126 U/L Final  . Total Bilirubin 08/06/2016 0.4  0.3 - 1.2 mg/dL Final  . GFR calc non Af Amer 08/06/2016 36* >60 mL/min Final  . GFR calc Af Amer 08/06/2016 42* >60 mL/min Final   Comment: (NOTE) The eGFR has been calculated using the CKD EPI equation. This calculation has not been validated in all clinical situations. eGFR's persistently <60 mL/min signify possible Chronic Kidney Disease.   . Anion gap 08/06/2016 10  5 - 15 Final  . Magnesium 08/06/2016 1.8  1.7 - 2.4 mg/dL Final    No visits with results within 3 Day(s) from this visit.  Latest known visit with results is:  Admission on 06/21/2016, Discharged on 06/21/2016  Component Date Value Ref Range Status  . SURGICAL PATHOLOGY 06/27/2016    Final                   Value:Surgical Pathology CASE: [id number] PATIENT: Avri Stegeman Surgical Pathology Report     SPECIMEN SUBMITTED: A. Recto-sigmoid mass; cbx  CLINICAL HISTORY: None provided  PRE-OPERATIVE DIAGNOSIS: Right pelvic retroperitoneal mass.  Needs biopsy  POST-OPERATIVE DIAGNOSIS: Invasive peri-rectal mass     DIAGNOSIS: A. RECTOSIGMOID MASS; COLD BIOPSY: - UROTHELIAL CARCINOMA UNDERMINING COLONIC MUCOSA.  Comment:  A limited panel of immunohistochemical stains was performed. The neoplastic cells have the following immunoreactivity: Super pancytokeratin: Positive p40: Positive GATA-3: Positive SOX-1:  Negative CD45: Negative Vimentin: Negative DOG-1:  Negative PAX-8: Negative CDX-2: Negative p16: Negative (high background) These findings support the above diagnosis. These results were communicated to Dr. Mike Gip on 06/27/2016. Stain controls worked appropriately.  GROSS DESCRIPTION: A. Labeled: rectosigmoid mass C BX Tissue fragment(s): multiple S                         ize: aggregate, 0.8 x 0.6 x 0.1 cm Description: tan fragments  Entirely submitted in 1 cassette(s).  Final Diagnosis performed by Quay Burow, MD.  Electronically signed 06/27/2016 4:00:48PM    The electronic signature indicates that the named Attending Pathologist has evaluated the specimen  Technical component performed at [facility], [address], [place], [place] [address] Lab: [phone] Dir: Darrick Penna. Evette Doffing, MD  Professional component performed at [facility], [facility], [place], [place], [place] [address] Lab: [phone] Dir: Dellia Nims. Reuel Derby, MD      Assessment:  Letrice Pollok is a 65 y.o. 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  She has a history of lower abdomen for 2 years felt secondary to irritable bowel syndrome.  She has had RLQ pain which has extended to her right buttock and hip for 3 months.  She has lost 38 pounds in the past year.  Lower endoscopic ultrasound on 06/21/2016 revealed an infiltrative 5.6 x 4.5 cm non-obstructing large mass in the recto-sigmoid colon extendiing from 14 cm to 18 cm from the anal verge. The mass was non-circumferential.  Biopsies revealed a urothelial carcinoma.  Pathologic stage was T4NxM0.  Abdominal and pelvic CT scan on 05/14/2016 revealed a 5.7 cm right lower quadrant retroperitoneal mass highly concerning for malignancy. The mass involved the sigmoid colon but is favored to reflect a primary retroperitoneal tumor (such as sarcoma) over a primary colonic mass.  There was chronic right ureteral  obstruction by the mass with hydronephrosis and renal atrophy.  There were multiple subcentimeter liver lesions, indeterminate.   PET scan on 06/06/2016 revealed hypermetabolism (SUV 19.7)  corresponding to the right lower quadrant retroperitoneal mass. It was felt intimately associated with the rectosigmoid colon, but originating outside of the colon. There was similar right-sided hydroureteronephrosis due to the right lower quadrant mass.  There was a 4 mm nodule in the left lower lobe (unclear significance).  [facility] at [facility] on 07/18/2015 revealed 3 adenomatous polyps.  She underwent right nephrostomy tube placement on 07/11/2016.  Urine output is 25-30 cc/day.  She has a normocytic anemia.  Labs on 07/26/2016 revealed a ferritin (284), iron saturation (4%), TIBC 179 (low) B12 (628), folate (7.1), and ESR (54).  She has had some blood from her nephrostomy tube.  She has anemia of chronic disease.  Diet is poor.  She received 1 unit PRBCs on 08/01/2016.  She has leukocytosis secondary to her underlying malignancy.  Work-up on 07/26/2016 revealed a negative urinalysis, blood cultures, and CXR.  Urinalysis revealed 20,000 colonies/ml Klebsiella pneumonia.  She was treated with ciprofloxacin beginning 07/28/2016.  Prior to treatment, she lost 35+ pounds.  She began Megace on 07/26/2016.  She is eating better.  She has gained 1 pound.  She is day 8 of cycle #1 cisplatin and gemcitabine (07/30/2016).  Cisplatin was given as a split dose (day 1 and 2).  Symptomatically, she has had some abdominal and back pain.  Urine is cloudy.  Exam is stable.  Hematocrit is 28.7.  Sodium is 129.  Potassium is 3.0.  Plan: 1.  Labs today:  CBC  with diff, CMP, Mg, hold tube. 2.  Day 8 gemcitabine today. 3.  UA and culture from ostomy and clean cath. 4.  IVF 1 liter NS + 20 KCL 5.  RTC on 08/08/2016 for labs (BMP, Mg) +/- IVF 6.  RTC in 1 week for MD assessment, labs (CBC with diff, CMP, Mg), and  +/- D15  gemcitabine +/- IVF   Lequita Asal, MD 08/06/2016, 2:33 PM

## 2016-08-06 NOTE — Progress Notes (Signed)
Patient has completed antibiotics, urine is cloudy, nausea and vomiting yesterday related to mucus.  Patient states she is having right lower abdominal, right lower back and right buttock, pain rating an 8/10.

## 2016-08-07 ENCOUNTER — Other Ambulatory Visit: Payer: Self-pay | Admitting: *Deleted

## 2016-08-07 DIAGNOSIS — C661 Malignant neoplasm of right ureter: Secondary | ICD-10-CM

## 2016-08-07 LAB — URINE CULTURE: Culture: <10000 — AB

## 2016-08-07 NOTE — Telephone Encounter (Signed)
Notified pt of right nephrostomy tube exchange scheduled 08/31/16 with arrival time to [facility] registration desk of 10:00. Advised pt to have a driver, be npo after mn & to take only the diltiazem with a sip of water on the morning of the procedure. Pt voices understanding.

## 2016-08-08 ENCOUNTER — Inpatient Hospital Stay: Payer: Managed Care, Other (non HMO)

## 2016-08-08 ENCOUNTER — Emergency Department: Payer: 59

## 2016-08-08 ENCOUNTER — Emergency Department
Admission: EM | Admit: 2016-08-08 | Discharge: 2016-08-08 | Disposition: A | Discharge status: Other Acute Inpatient Hospital | Payer: 59 | Attending: Emergency Medicine | Admitting: Emergency Medicine

## 2016-08-08 ENCOUNTER — Telehealth: Payer: Self-pay | Admitting: *Deleted

## 2016-08-08 ENCOUNTER — Encounter: Payer: Self-pay | Admitting: Emergency Medicine

## 2016-08-08 ENCOUNTER — Other Ambulatory Visit: Payer: Self-pay

## 2016-08-08 DIAGNOSIS — Y939 Activity, unspecified: Secondary | ICD-10-CM | POA: Diagnosis not present

## 2016-08-08 DIAGNOSIS — S062X0A Diffuse traumatic brain injury without loss of consciousness, initial encounter: Secondary | ICD-10-CM | POA: Insufficient documentation

## 2016-08-08 DIAGNOSIS — Y999 Unspecified external cause status: Secondary | ICD-10-CM | POA: Diagnosis not present

## 2016-08-08 DIAGNOSIS — S0990XA Unspecified injury of head, initial encounter: Secondary | ICD-10-CM

## 2016-08-08 DIAGNOSIS — Z791 Long term (current) use of non-steroidal anti-inflammatories (NSAID): Secondary | ICD-10-CM | POA: Insufficient documentation

## 2016-08-08 DIAGNOSIS — Y929 Unspecified place or not applicable: Secondary | ICD-10-CM | POA: Insufficient documentation

## 2016-08-08 DIAGNOSIS — I629 Nontraumatic intracranial hemorrhage, unspecified: Secondary | ICD-10-CM

## 2016-08-08 DIAGNOSIS — J449 Chronic obstructive pulmonary disease, unspecified: Secondary | ICD-10-CM | POA: Diagnosis not present

## 2016-08-08 DIAGNOSIS — I10 Essential (primary) hypertension: Secondary | ICD-10-CM | POA: Insufficient documentation

## 2016-08-08 DIAGNOSIS — W19XXXA Unspecified fall, initial encounter: Secondary | ICD-10-CM | POA: Diagnosis not present

## 2016-08-08 DIAGNOSIS — R55 Syncope and collapse: Secondary | ICD-10-CM

## 2016-08-08 DIAGNOSIS — S0003XA Contusion of scalp, initial encounter: Secondary | ICD-10-CM | POA: Diagnosis not present

## 2016-08-08 DIAGNOSIS — Z79899 Other long term (current) drug therapy: Secondary | ICD-10-CM | POA: Insufficient documentation

## 2016-08-08 DIAGNOSIS — Z8554 Personal history of malignant neoplasm of ureter: Secondary | ICD-10-CM | POA: Diagnosis not present

## 2016-08-08 DIAGNOSIS — R7989 Other specified abnormal findings of blood chemistry: Secondary | ICD-10-CM | POA: Diagnosis not present

## 2016-08-08 DIAGNOSIS — R778 Other specified abnormalities of plasma proteins: Secondary | ICD-10-CM

## 2016-08-08 DIAGNOSIS — J45909 Unspecified asthma, uncomplicated: Secondary | ICD-10-CM | POA: Insufficient documentation

## 2016-08-08 LAB — HEPATIC FUNCTION PANEL
ALT: 10 U/L — ABNORMAL LOW (ref 14–54)
AST: 18 U/L (ref 15–41)
Albumin: 3.3 g/dL — ABNORMAL LOW (ref 3.5–5.0)
Alkaline Phosphatase: 72 U/L (ref 38–126)
BILIRUBIN DIRECT: 0.2 mg/dL (ref 0.1–0.5)
BILIRUBIN TOTAL: 0.8 mg/dL (ref 0.3–1.2)
Indirect Bilirubin: 0.6 mg/dL (ref 0.3–0.9)
Total Protein: 7 g/dL (ref 6.5–8.1)

## 2016-08-08 LAB — BASIC METABOLIC PANEL
ANION GAP: 11 (ref 5–15)
BUN: 34 mg/dL — AB (ref 6–20)
CALCIUM: 8.7 mg/dL — AB (ref 8.9–10.3)
CO2: 23 mmol/L (ref 22–32)
CREATININE: 1.94 mg/dL — AB (ref 0.44–1.00)
Chloride: 98 mmol/L — ABNORMAL LOW (ref 101–111)
GFR calc Af Amer: 31 mL/min — ABNORMAL LOW (ref >60)
GFR, EST NON AFRICAN AMERICAN: 26 mL/min — AB (ref >60)
GLUCOSE: 123 mg/dL — AB (ref 65–99)
Potassium: 3.9 mmol/L (ref 3.5–5.1)
Sodium: 132 mmol/L — ABNORMAL LOW (ref 135–145)

## 2016-08-08 LAB — CBC
HCT: 30.3 % — ABNORMAL LOW (ref 35.0–47.0)
Hemoglobin: 9.8 g/dL — ABNORMAL LOW (ref 12.0–16.0)
MCH: 25.3 pg — AB (ref 26.0–34.0)
MCHC: 32.4 g/dL (ref 32.0–36.0)
MCV: 78.2 fL — AB (ref 80.0–100.0)
PLATELETS: ADEQUATE 10*3/uL (ref 150–440)
RBC: 3.88 MIL/uL (ref 3.80–5.20)
RDW: 16.9 % — AB (ref 11.5–14.5)
WBC: 23.9 10*3/uL — AB (ref 3.6–11.0)

## 2016-08-08 LAB — URINE CULTURE: Culture: >=100000 — AB

## 2016-08-08 LAB — TROPONIN I: Troponin I: 0.37 ng/mL (ref <0.03)

## 2016-08-08 MED ORDER — ONDANSETRON HCL 4 MG/2ML IJ SOLN
4.0000 mg | Freq: Once | INTRAMUSCULAR | Status: AC
  Administered 2016-08-08: 4 mg via INTRAVENOUS

## 2016-08-08 MED ORDER — SODIUM CHLORIDE 0.9 % IV SOLN
Freq: Once | INTRAVENOUS | Status: AC
  Administered 2016-08-08: 08:00:00 via INTRAVENOUS

## 2016-08-08 MED ORDER — LEVOFLOXACIN 500 MG PO TABS: 500.0000 mg | ORAL_TABLET | Freq: Every day | ORAL | 0 refills | Status: DC

## 2016-08-08 MED ORDER — ONDANSETRON HCL 4 MG/2ML IJ SOLN
INTRAMUSCULAR | Status: AC
  Administered 2016-08-08: 4 mg via INTRAVENOUS
  Filled 2016-08-08: qty 2

## 2016-08-08 NOTE — ED Triage Notes (Addendum)
Pt presents to ED from home via EMS due to a fall this morning. Per caregiver, she heard a loud "thud" and states found patient on floor and unresponsive. Pt is alert and oriented x 4 at baseline, pt ambulates without cane or walker. Pt is a stage 4 cancer patient in ureter. Large hematoma noted to left back side of head. Per caregiver pt given 7.5 mg injection of compazine at 3 am this morning. Per caregiver pt is not on blood thinners.

## 2016-08-08 NOTE — ED Notes (Signed)
Dr. Dahlia Client in room to discuss with patient sister [facility]) about potential treatment due to CT results. Sister reports will discuss with patient her wishes for treatment and will notify this RN and MD when decision is made.

## 2016-08-08 NOTE — Telephone Encounter (Signed)
Called Sandra Landry and left message that I have sent tatb in for her tube growing bacteria and it is sensitive to levaquin and 500 mg daily x 7 days

## 2016-08-08 NOTE — ED Notes (Addendum)
Pt vomited x 1 in room. MD notified, pt provided with Zofran IV to help with nausea and vomiting. Pt caregiver(POA) requesting to be transferred to [facility], requesting pt not be intubated for transfer. Pt has DNR form on chart.

## 2016-08-08 NOTE — Telephone Encounter (Signed)
-----   Message from Lequita Asal, MD sent at 08/08/2016 10:33 AM EST ----- Regarding: Please ask Dr B to prescribe antibiotic   ----- Message ----- From: Interface, Lab In [place] Sent: 08/06/2016   2:09 PM To: Lequita Asal, MD

## 2016-08-08 NOTE — Telephone Encounter (Signed)
Patty called back to say that pt is in [facility] and getting admitted. She fell this morning and hit her head and bleeding. They are doing MRI right now.  She will give [place] updates.  I told her what the bacteria was but told her that md can look in care everywhere and see results

## 2016-08-08 NOTE — ED Notes (Signed)
Called EMS for Transport [phone]

## 2016-08-08 NOTE — ED Notes (Signed)
Patient transported to CT 
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
## 2016-08-13 ENCOUNTER — Telehealth: Payer: Self-pay | Admitting: *Deleted

## 2016-08-13 NOTE — Telephone Encounter (Signed)
Cancelled chemo appt for tomorrow, she is in Neuro hospital at [facility] s/p fall and head injury. Plan is for discharge form [facility] on Wednesday

## 2016-08-14 ENCOUNTER — Inpatient Hospital Stay: Payer: Managed Care, Other (non HMO) | Admitting: Hematology and Oncology

## 2016-08-14 ENCOUNTER — Inpatient Hospital Stay: Payer: Managed Care, Other (non HMO)

## 2016-08-22 ENCOUNTER — Inpatient Hospital Stay (HOSPITAL_BASED_OUTPATIENT_CLINIC_OR_DEPARTMENT_OTHER): Payer: 59 | Admitting: Hematology and Oncology

## 2016-08-22 ENCOUNTER — Other Ambulatory Visit: Payer: Self-pay | Admitting: *Deleted

## 2016-08-22 ENCOUNTER — Telehealth: Payer: Self-pay | Admitting: *Deleted

## 2016-08-22 ENCOUNTER — Inpatient Hospital Stay: Payer: 59 | Attending: Hematology and Oncology

## 2016-08-22 VITALS — BP 166/112 | HR 112 | Temp 96.7°F | Resp 18 | Wt 122.3 lb

## 2016-08-22 DIAGNOSIS — M797 Fibromyalgia: Secondary | ICD-10-CM

## 2016-08-22 DIAGNOSIS — K5900 Constipation, unspecified: Secondary | ICD-10-CM | POA: Insufficient documentation

## 2016-08-22 DIAGNOSIS — M129 Arthropathy, unspecified: Secondary | ICD-10-CM | POA: Insufficient documentation

## 2016-08-22 DIAGNOSIS — D72829 Elevated white blood cell count, unspecified: Secondary | ICD-10-CM | POA: Diagnosis not present

## 2016-08-22 DIAGNOSIS — Z5111 Encounter for antineoplastic chemotherapy: Secondary | ICD-10-CM | POA: Diagnosis not present

## 2016-08-22 DIAGNOSIS — M5030 Other cervical disc degeneration, unspecified cervical region: Secondary | ICD-10-CM | POA: Diagnosis not present

## 2016-08-22 DIAGNOSIS — Z8619 Personal history of other infectious and parasitic diseases: Secondary | ICD-10-CM | POA: Diagnosis not present

## 2016-08-22 DIAGNOSIS — E876 Hypokalemia: Secondary | ICD-10-CM | POA: Insufficient documentation

## 2016-08-22 DIAGNOSIS — N1330 Unspecified hydronephrosis: Secondary | ICD-10-CM | POA: Insufficient documentation

## 2016-08-22 DIAGNOSIS — J449 Chronic obstructive pulmonary disease, unspecified: Secondary | ICD-10-CM | POA: Insufficient documentation

## 2016-08-22 DIAGNOSIS — R000 Tachycardia, unspecified: Secondary | ICD-10-CM | POA: Insufficient documentation

## 2016-08-22 DIAGNOSIS — C661 Malignant neoplasm of right ureter: Secondary | ICD-10-CM

## 2016-08-22 DIAGNOSIS — R1110 Vomiting, unspecified: Secondary | ICD-10-CM | POA: Insufficient documentation

## 2016-08-22 DIAGNOSIS — K219 Gastro-esophageal reflux disease without esophagitis: Secondary | ICD-10-CM

## 2016-08-22 DIAGNOSIS — Z86010 Personal history of colonic polyps: Secondary | ICD-10-CM | POA: Insufficient documentation

## 2016-08-22 DIAGNOSIS — R634 Abnormal weight loss: Secondary | ICD-10-CM | POA: Diagnosis not present

## 2016-08-22 DIAGNOSIS — Z87820 Personal history of traumatic brain injury: Secondary | ICD-10-CM | POA: Diagnosis not present

## 2016-08-22 DIAGNOSIS — F329 Major depressive disorder, single episode, unspecified: Secondary | ICD-10-CM | POA: Diagnosis not present

## 2016-08-22 DIAGNOSIS — F419 Anxiety disorder, unspecified: Secondary | ICD-10-CM

## 2016-08-22 DIAGNOSIS — R440 Auditory hallucinations: Secondary | ICD-10-CM | POA: Diagnosis not present

## 2016-08-22 DIAGNOSIS — J45909 Unspecified asthma, uncomplicated: Secondary | ICD-10-CM

## 2016-08-22 DIAGNOSIS — Z79899 Other long term (current) drug therapy: Secondary | ICD-10-CM | POA: Insufficient documentation

## 2016-08-22 DIAGNOSIS — D631 Anemia in chronic kidney disease: Secondary | ICD-10-CM | POA: Insufficient documentation

## 2016-08-22 DIAGNOSIS — E871 Hypo-osmolality and hyponatremia: Secondary | ICD-10-CM | POA: Insufficient documentation

## 2016-08-22 DIAGNOSIS — H532 Diplopia: Secondary | ICD-10-CM | POA: Insufficient documentation

## 2016-08-22 DIAGNOSIS — Z8701 Personal history of pneumonia (recurrent): Secondary | ICD-10-CM | POA: Insufficient documentation

## 2016-08-22 DIAGNOSIS — I609 Nontraumatic subarachnoid hemorrhage, unspecified: Secondary | ICD-10-CM

## 2016-08-22 LAB — COMPREHENSIVE METABOLIC PANEL
ALT: 7 U/L — ABNORMAL LOW (ref 14–54)
AST: 16 U/L (ref 15–41)
Albumin: 3.5 g/dL (ref 3.5–5.0)
Alkaline Phosphatase: 86 U/L (ref 38–126)
Anion gap: 9 (ref 5–15)
BUN: 10 mg/dL (ref 6–20)
CO2: 23 mmol/L (ref 22–32)
Calcium: 9 mg/dL (ref 8.9–10.3)
Chloride: 102 mmol/L (ref 101–111)
Creatinine, Ser: 1.28 mg/dL — ABNORMAL HIGH (ref 0.44–1.00)
GFR calc Af Amer: 50 mL/min — ABNORMAL LOW (ref >60)
GFR calc non Af Amer: 44 mL/min — ABNORMAL LOW (ref >60)
Glucose, Bld: 106 mg/dL — ABNORMAL HIGH (ref 65–99)
Potassium: 4 mmol/L (ref 3.5–5.1)
Sodium: 134 mmol/L — ABNORMAL LOW (ref 135–145)
Total Bilirubin: 0.6 mg/dL (ref 0.3–1.2)
Total Protein: 6.9 g/dL (ref 6.5–8.1)

## 2016-08-22 LAB — CBC WITH DIFFERENTIAL/PLATELET
Basophils Absolute: 0.1 10*3/uL (ref 0–0.1)
Basophils Relative: 1 %
Eosinophils Absolute: 0.1 10*3/uL (ref 0–0.7)
Eosinophils Relative: 2 %
HCT: 31.4 % — ABNORMAL LOW (ref 35.0–47.0)
Hemoglobin: 10.5 g/dL — ABNORMAL LOW (ref 12.0–16.0)
Lymphocytes Relative: 38 %
Lymphs Abs: 2.5 10*3/uL (ref 1.0–3.6)
MCH: 27 pg (ref 26.0–34.0)
MCHC: 33.3 g/dL (ref 32.0–36.0)
MCV: 80.9 fL (ref 80.0–100.0)
Monocytes Absolute: 0.9 10*3/uL (ref 0.2–0.9)
Monocytes Relative: 14 %
Neutro Abs: 3 10*3/uL (ref 1.4–6.5)
Neutrophils Relative %: 45 %
Platelets: 839 10*3/uL — ABNORMAL HIGH (ref 150–440)
RBC: 3.89 MIL/uL (ref 3.80–5.20)
RDW: 18.1 % — ABNORMAL HIGH (ref 11.5–14.5)
WBC: 6.7 10*3/uL (ref 3.6–11.0)

## 2016-08-22 NOTE — Progress Notes (Signed)
[facility] day:  08/22/2016   Chief Complaint: Rashmi Tallent is a 65 y.o. female with locally advanced right ureteral cancer who is seen for reassessment after interval hospitalization at [facility].  HPI: The patient was last seen in the medical oncology clinic on 08/06/2016.  At that time, she noted back and abdominal discomfort.  She had gained 1 pound.  She had hyponatremia and hypokalemia. She received day 8 gemcitabine.  She also received IVF and potassium.  The patient presented to [facility] ER on 08/08/2016 following a fall.  She got up about 3 AM and fell. She was unconscious.  911 was called.  Head CT without contrast on 08/08/2106 revealed patchy areas of poorly defined parenchymal hemorrhage in the left posterior parietal and left anterior medial frontal regions consistent with venous shear injuries. Extra-axial hemorrhage was demonstrated in the foramen magnum and extending along the upper cervical canal to the level of the body of C2. This caused effacement of CSF space and displacement of the brainstem and proximal cervical cord.  She was transported to [facility].  She was admitted from 08/08/2016 - 08/17/2016.  Head MRI on 08/08/2016 revealed multi-compartment intracranial hemorrhage with no significant change in the degree or distribution of subarachnoid hemorrhage.  There was small volume intraventricular hemorrhage. Motion degraded MRA, although without convincing evidence of aneurysm, high-grade stenosis, or occlusion of the circle of Willis vessels.  No intervention is felt necessary her neurosurgery.  Creatinine on admission was 1.94. She received IV fluids with potassium supplementation.  While hospitalized, she received 1 unit of platelets and 2 units of packed red blood cells.  On admission, she had atrial fibrillation with RVR.  She continued diltiazem.  Urine culture from the right nephrostomy tube revealed stenotrophomonas sensitive to  fluoroquinolones.  She completed a 5 day course of Levaquin.  Chest, abdomen, and pelvic CT scan on 08/08/2016 revealed a mild age-indeterminate compression fracture of T6. There was a 6 mm hypodensity at the dome of the liver too small to characterize and a few additional small subcentimeter hypodense scattered lesions in the liver. There was a 7.3 x 6.4 x 7.7 cm right pelvic mass invading the sacrum and causing a lytic lesion with pathologic fracture.  The mass likely invaded the right psoas muscle.  The patient was offered skilled nursing but declined. She has remained in bed since discharge from the hospital. She states that she is sleeping 18 hours a day.  She has no energy and is weak. Oral intake has remained poor. She is not taking any Ensure or Boost.  She notes a headache and decreased vision in her left eye since the event (no change).  She has restless sleep.   Past Medical History:  Diagnosis Date  . Anxiety   . Arthritis   . Asthma    mild  . Cervical disc disease   . COPD (chronic obstructive pulmonary disease) (HCC)    mild  . Depression   . Dysrhythmia    tachycardia  . Fibromyalgia   . GERD (gastroesophageal reflux disease)   . Hepatitis C     Past Surgical History:  Procedure Laterality Date  . cervical discectomy with fusion    . COLONOSCOPY  01/2016  . EUS N/A 06/21/2016   Procedure: LOWER ENDOSCOPIC ULTRASOUND (EUS);  Surgeon: Reita Cliche, MD;  Location: [facility] ENDOSCOPY;  Service: Endoscopy;  Laterality: N/A;    No family history on file.  Social History:  reports  that she has never smoked. She has never used smokeless tobacco. She reports that she uses drugs, including Marijuana. She reports that she does not drink alcohol.  She lives in [place].  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  She has also worked in the Physicist, medical.  The patient is accompanied by her partner, Darliss Ridgel, a nurse, today.  Allergies:  Allergies  Allergen Reactions  . Other  Swelling    Dust, ragweed  . Iodides   . Tetanus Toxoids Other (See Comments)    Localized pain and swelling  . Iodine Other (See Comments) and Rash    Limited history. Occurred in 55s, at age 11-12, received contrast at public health facility in [place], while being evaluated for meningitis. Immediate rash - not itchy, "flat, red, perfect circles" over entire body. Has not received iodine contrast since.    Current Medications: Current Outpatient Prescriptions  Medication Sig Dispense Refill  . baclofen (LIORESAL) 20 MG tablet Take 20 mg by mouth 4 (four) times daily as needed for muscle spasms.     . cetirizine (ZYRTEC) 10 MG tablet Take 10 mg by mouth daily.     Marland Kitchen diltiazem (CARDIZEM CD) 120 MG 24 hr capsule Take 120 mg by mouth daily.     . diphenhydrAMINE (BENADRYL) 25 MG tablet Take 25-50 mg by mouth every 6 (six) hours as needed.    Marland Kitchen escitalopram (LEXAPRO) 20 MG tablet Take 20 mg by mouth daily.     Marland Kitchen ibuprofen (ADVIL,MOTRIN) 200 MG tablet Take 200-600 mg by mouth every 6 (six) hours as needed for mild pain or moderate pain.    Marland Kitchen lactulose (CHRONULAC) 10 GM/15ML solution     . oxyCODONE-acetaminophen (PERCOCET/ROXICET) 5-325 MG tablet     . polyethylene glycol (MIRALAX / GLYCOLAX) packet Take 17 g by mouth daily as needed.     . prochlorperazine (COMPAZINE) 5 MG/ML injection Inject 5-10 mg into the vein as directed. Every 4 to 6 hours as needed.    . promethazine (PHENERGAN) 25 MG suppository Place 25 mg rectally as directed. Every 4 to 6 hours as needed    . ranitidine (ZANTAC) 300 MG tablet Take 300 mg by mouth daily.     . traMADol (ULTRAM) 50 MG tablet Take 50-100 mg by mouth every 4 (four) hours as needed.     . potassium chloride (K-DUR) 10 MEQ tablet Take 2 tablets (20 mEq total) by mouth daily. 28 tablet 0  . potassium chloride (MICRO-K) 10 MEQ CR capsule      No current facility-administered medications for this visit.     Review of Systems:  GENERAL:  Fatigue.   No fever or sweats.  Weight loss of 12 pounds since last visit. PERFORMANCE STATUS (ECOG):  3 HEENT:  Decreased vision in left eye since fall (diplopia when eye open).  No runny nose, sore throat, mouth sores or tenderness. Lungs:  Shortness of breath with exertion.  No cough.  No hemoptysis. Cardiac:  No chest pain, palpitations, orthopnea, or PND. GI:  Appetite poor.  Chronic nausea and vomiting.  Irritable bowel. No diarrhea, constipationmelena or hematochezia. GU:  Right urostomy tube.No urgency, frequency, or dysuria. Musculoskeletal:  Right sided back pain.  No joint pain.  No muscle tenderness. Extremities:  No pain or swelling. Skin:  No rashes or skin changes. Neuro:  Headache post fall.  No numbness or weakness, balance or coordination issues. Endocrine:  No diabetes, thyroid issues, hot flashes or night sweats.  Psych:  Hypersensitive.  PTSD.  Depression.  Anxiety.  Sleeping 18 hours/day.  Restless sleep. Pain:  Low back pain. Review of systems:  All other systems reviewed and found to be negative.  Physical Exam: BP (!) 153/109   Pulse (!) 112   Temp (!) 96.7 F (35.9 C) (Tympanic)   Resp 18   Wt 122 lb 5 oz (55.5 kg)   BMI 19.74 kg/m  Blood pressure 94/62, pulse 65, temperature (!) 95.1 F (35.1 C), temperature source Tympanic, resp. rate 18, weight 136 lb 0.4 oz (61.7 kg). GENERAL:  Thin, fatigued appearing woman who is sitting in a wheelchair in the exam room in no acute distress.  MENTAL STATUS:  Alert and oriented to person, place and time. HEAD:  Shaved head.  Normocephalic, atraumatic, face symmetric, no Cushingoid features. EYES:  Pupils equal round and reactive to light and accomodation.  No conjunctivitis or scleral icterus.  Intermittently closes left eye. ENT:  Oropharynx clear without lesion.  No upper teeth.  Tongue normal. Mucous membranes moist.  RESPIRATORY:  Clear to auscultation without rales, wheezes or rhonchi. CARDIOVASCULAR:  Regular rate and rhythm  without murmur, rub or gallop. ABDOMEN:  Soft, nontender with active bowel sounds and no hepatosplenomegaly.   BACK:  Right sided urostomy with dressing in place. SKIN:  No rashes, ulcers or lesions. EXTREMITIES: No edema, no skin discoloration or tenderness.  No palpable cords. LYMPH NODES: No palpable cervical, supraclavicular, axillary or inguinal adenopathy  NEUROLOGICAL:  General weakness. Non-focal.  Moves all 4 extremities. PSYCH:  Appropriate.   Appointment on 08/22/2016  Component Date Value Ref Range Status  . WBC 08/22/2016 6.7  3.6 - 11.0 K/uL Final  . RBC 08/22/2016 3.89  3.80 - 5.20 MIL/uL Final  . Hemoglobin 08/22/2016 10.5* 12.0 - 16.0 g/dL Final  . HCT 08/22/2016 31.4* 35.0 - 47.0 % Final  . MCV 08/22/2016 80.9  80.0 - 100.0 fL Final  . MCH 08/22/2016 27.0  26.0 - 34.0 pg Final  . MCHC 08/22/2016 33.3  32.0 - 36.0 g/dL Final  . RDW 08/22/2016 18.1* 11.5 - 14.5 % Final  . Platelets 08/22/2016 839* 150 - 440 K/uL Final  . Neutrophils Relative % 08/22/2016 45  % Final  . Neutro Abs 08/22/2016 3.0  1.4 - 6.5 K/uL Final  . Lymphocytes Relative 08/22/2016 38  % Final  . Lymphs Abs 08/22/2016 2.5  1.0 - 3.6 K/uL Final  . Monocytes Relative 08/22/2016 14  % Final  . Monocytes Absolute 08/22/2016 0.9  0.2 - 0.9 K/uL Final  . Eosinophils Relative 08/22/2016 2  % Final  . Eosinophils Absolute 08/22/2016 0.1  0 - 0.7 K/uL Final  . Basophils Relative 08/22/2016 1  % Final  . Basophils Absolute 08/22/2016 0.1  0 - 0.1 K/uL Final  . Sodium 08/22/2016 134* 135 - 145 mmol/L Final  . Potassium 08/22/2016 4.0  3.5 - 5.1 mmol/L Final  . Chloride 08/22/2016 102  101 - 111 mmol/L Final  . CO2 08/22/2016 23  22 - 32 mmol/L Final  . Glucose, Bld 08/22/2016 106* 65 - 99 mg/dL Final  . BUN 08/22/2016 10  6 - 20 mg/dL Final  . Creatinine, Ser 08/22/2016 1.28* 0.44 - 1.00 mg/dL Final  . Calcium 08/22/2016 9.0  8.9 - 10.3 mg/dL Final  . Total Protein 08/22/2016 6.9  6.5 - 8.1 g/dL Final   . Albumin 08/22/2016 3.5  3.5 - 5.0 g/dL Final  . AST 08/22/2016 16  15 - 41  U/L Final  . ALT 08/22/2016 7* 14 - 54 U/L Final  . Alkaline Phosphatase 08/22/2016 86  38 - 126 U/L Final  . Total Bilirubin 08/22/2016 0.6  0.3 - 1.2 mg/dL Final  . GFR calc non Af Amer 08/22/2016 44* >60 mL/min Final  . GFR calc Af Amer 08/22/2016 50* >60 mL/min Final   Comment: (NOTE) The eGFR has been calculated using the CKD EPI equation. This calculation has not been validated in all clinical situations. eGFR's persistently <60 mL/min signify possible Chronic Kidney Disease.   . Anion gap 08/22/2016 9  5 - 15 Final    No visits with results within 3 Day(s) from this visit.  Latest known visit with results is:  Admission on 06/21/2016, Discharged on 06/21/2016  Component Date Value Ref Range Status  . SURGICAL PATHOLOGY 06/27/2016    Final                   Value:Surgical Pathology CASE: [id number] PATIENT: Mandi Hegg Surgical Pathology Report     SPECIMEN SUBMITTED: A. Recto-sigmoid mass; cbx  CLINICAL HISTORY: None provided  PRE-OPERATIVE DIAGNOSIS: Right pelvic retroperitoneal mass.  Needs biopsy  POST-OPERATIVE DIAGNOSIS: Invasive peri-rectal mass     DIAGNOSIS: A. RECTOSIGMOID MASS; COLD BIOPSY: - UROTHELIAL CARCINOMA UNDERMINING COLONIC MUCOSA.  Comment:  A limited panel of immunohistochemical stains was performed. The neoplastic cells have the following immunoreactivity: Super pancytokeratin: Positive p40: Positive GATA-3: Positive SOX-1: Negative CD45: Negative Vimentin: Negative DOG-1: Negative PAX-8: Negative CDX-2: Negative p16: Negative (high background) These findings support the above diagnosis. These results were communicated to Dr. Mike Gip on 06/27/2016. Stain controls worked appropriately.  GROSS DESCRIPTION: A. Labeled: rectosigmoid mass C BX Tissue fragment(s): multiple S                         ize: aggregate, 0.8 x 0.6 x 0.1  cm Description: tan fragments  Entirely submitted in 1 cassette(s).  Final Diagnosis performed by Quay Burow, MD.  Electronically signed 06/27/2016 4:00:48PM    The electronic signature indicates that the named Attending Pathologist has evaluated the specimen  Technical component performed at [facility], [address] [place], [place] [address] Lab: [phone] Dir: Darrick Penna. Evette Doffing, MD  Professional component performed at [facility], [facility], [place], [place], [place] [address] Lab: [phone] Dir: Dellia Nims. Reuel Derby, MD      Assessment:  Euleta Belson is a 65 y.o. 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  She has a history of lower abdomen for 2 years felt secondary to irritable bowel syndrome.  She has had RLQ pain which has extended to her right buttock and hip for 3 months.  She has lost 38 pounds in the past year.  Lower endoscopic ultrasound on 06/21/2016 revealed an infiltrative 5.6 x 4.5 cm non-obstructing large mass in the recto-sigmoid colon extendiing from 14 cm to 18 cm from the anal verge. The mass was non-circumferential.  Biopsies revealed a urothelial carcinoma.  Pathologic stage was T4NxM0.  Abdominal and pelvic CT scan on 05/14/2016 revealed a 5.7 cm right lower quadrant retroperitoneal mass highly concerning for malignancy. The mass involved the sigmoid colon but is favored to reflect a primary retroperitoneal tumor (such as sarcoma) over a primary colonic mass.  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  There were multiple subcentimeter liver lesions, indeterminate.   PET scan on 06/06/2016 revealed hypermetabolism (SUV 19.7)  corresponding to the right lower quadrant retroperitoneal mass. It was felt intimately associated with the rectosigmoid colon, but originating outside of the colon. There was similar right-sided hydroureteronephrosis due to the right  lower quadrant mass.  There was a 4 mm nodule in the left lower lobe (unclear significance).  Colonoscopy at [facility] on 07/18/2015 revealed 3 adenomatous polyps.  She underwent right nephrostomy tube placement on 07/11/2016  She has a normocytic anemia.  Labs on 07/26/2016 revealed a ferritin (284), iron saturation (4%), TIBC 179 (low) B12 (628), folate (7.1), and ESR (54).  She has had some blood from her nephrostomy tube.  She has anemia of chronic disease.  Diet is poor.  She received 1 unit PRBCs on 08/01/2016.  She has leukocytosis secondary to her underlying malignancy.  Work-up on 07/26/2016 revealed a negative urinalysis, blood cultures, and CXR.  Urinalysis revealed 20,000 colonies/ml Klebsiella pneumonia.  She received a course of ciprofloxacin.  Urine culture on 08/06/2016 revealed Stenotrophomonas maltophilia.  She was treated with a course of Levaquin.  Prior to treatment, she lost 35+ pounds.  She began Megace on 07/26/2016.  She has lost an additional 12 pounds with her recent admission to [facility].  She received 1 cycle #1 cisplatin and gemcitabine (07/30/2016 - 08/06/2016).  Cisplatin was given as a split dose (day 1 and 2).  Course was complicated by an admission to [facility] on 08/08/2016 following a fall.  Head MRI revealed a subarachnoid hemorrhage.  She had atrial fibrillation with RVR.  Chest, abdomen, and pelvic CT scan on 08/08/2016 revealed a mild age-indeterminate compression fracture of T6. There was a 6 mm hypodensity at the dome of the liver too small to characterize and a few additional small subcentimeter hypodense scattered lesions in the liver. There was a 7.3 x 6.4 x 7.7 cm right pelvic mass invading the sacrum and causing a lytic lesion with pathologic fracture.  The mass likely invaded the right psoas muscle.  Symptomatically, she is extremely weak.  She has been sleeping 18 hours/day.  Oral intake is poor.  She just wants to go home today.  Plan: 1.  Labs today:  CBC with  diff, CMP. 2.  Discuss events of hospitalization.  Discuss patient's thoughts about therapy.  Patient requires full time care, but wishes to stay home.  She declined a skilled nursing facility.  Her partner works and is unable to stay at home.  Discuss concern for decreased fluid and caloric intake and general weakness.  Concern for falls.  Discuss treatment or supportive care.  Patient is too debilitated to receive care at this point.  Discuss option of Hospice.  Discuss IVF.  Patient just wishes to go home today. 3.  Patient to call with decision re: care and follow-up.   Lequita Asal, MD 08/22/2016, 12:42 PM

## 2016-08-22 NOTE — Telephone Encounter (Signed)
Asking if she can have labs checked, her appt to see MD not until the 13th. Was discharged from [facility] and is very weak and sleeping a lot and she is concerned that she is going to fall again and she has to go to work. Please advise

## 2016-08-22 NOTE — Progress Notes (Signed)
Patient in clinic today for acute visit.  Patient discharged from hospital on Friday.  Since that time she is only eating a few bites (did not eat much in hospital). Sleeping up to 18 hours a day.  BP elevated 154/111 HR 109.  States BP "all over the place" while in the hospital.  Very weak. Uses a walker at home.  Only goes to the [place] at home and back.  No new diagnosis since last visit.

## 2016-08-22 NOTE — Telephone Encounter (Signed)
Called Sandra Landry and let her know that dr Mike Gip is glad to see her and have labs today if they can come to [place] office.  The last time corcoran spoke to md at [facility] they said that she was going to a skilled facility. Chong Sicilian states that she did not feel that she would get good care there. She is willing to bring her to [facility] today and I gave her the time 11 am labs and 11:15 to see md

## 2016-08-23 ENCOUNTER — Telehealth: Payer: Self-pay | Admitting: *Deleted

## 2016-08-27 ENCOUNTER — Telehealth: Payer: Self-pay

## 2016-08-27 ENCOUNTER — Other Ambulatory Visit: Payer: Self-pay | Admitting: *Deleted

## 2016-08-27 DIAGNOSIS — C661 Malignant neoplasm of right ureter: Secondary | ICD-10-CM

## 2016-08-27 NOTE — Telephone Encounter (Signed)
  Oncology Nurse Navigator Documentation Received call from Ms. Sandra Landry. [name] is home from [place] following fall. She has home health, PT,SLP in place. She feels she is struggling with safety. She has someone staying with her all the time and is struggling with her own work hours. Reports a change in cognition since fall and possibly auditory hallucinations. Asking about resources to assist with safety and financials with having someone stay with her. Also reports need for nutrition consult for nausea and being edentulous. Will place dietician consult and PS navigator consult. Navigator Location: [facility] (08/27/16 0900)   )Navigator Encounter Type: Telephone (08/27/16 0900) Telephone: Incoming Call;Symptom Mgt (08/27/16 0900)                       Barriers/Navigation Needs: Coordination of Care;Financial;Family concerns (08/27/16 0900)   Interventions: Referrals;Coordination of Care;Psycho-social support (08/27/16 0900) Referrals: Social Work;Nutrition/dietician (08/27/16 0900)                    Time Spent with Patient: 30 (08/27/16 0900)

## 2016-08-29 ENCOUNTER — Inpatient Hospital Stay (HOSPITAL_BASED_OUTPATIENT_CLINIC_OR_DEPARTMENT_OTHER): Payer: 59 | Admitting: Hematology and Oncology

## 2016-08-29 ENCOUNTER — Ambulatory Visit
Admission: RE | Admit: 2016-08-29 | Discharge: 2016-08-29 | Disposition: A | Discharge status: Home / Self Care | Payer: 59 | Source: Ambulatory Visit | Attending: Hematology and Oncology | Admitting: Hematology and Oncology

## 2016-08-29 VITALS — BP 112/85 | HR 137 | Temp 97.7°F | Resp 18 | Wt 122.2 lb

## 2016-08-29 DIAGNOSIS — R1110 Vomiting, unspecified: Secondary | ICD-10-CM

## 2016-08-29 DIAGNOSIS — R634 Abnormal weight loss: Secondary | ICD-10-CM

## 2016-08-29 DIAGNOSIS — R000 Tachycardia, unspecified: Secondary | ICD-10-CM

## 2016-08-29 DIAGNOSIS — F419 Anxiety disorder, unspecified: Secondary | ICD-10-CM

## 2016-08-29 DIAGNOSIS — D72829 Elevated white blood cell count, unspecified: Secondary | ICD-10-CM | POA: Diagnosis not present

## 2016-08-29 DIAGNOSIS — K219 Gastro-esophageal reflux disease without esophagitis: Secondary | ICD-10-CM

## 2016-08-29 DIAGNOSIS — M797 Fibromyalgia: Secondary | ICD-10-CM

## 2016-08-29 DIAGNOSIS — M129 Arthropathy, unspecified: Secondary | ICD-10-CM

## 2016-08-29 DIAGNOSIS — Z8619 Personal history of other infectious and parasitic diseases: Secondary | ICD-10-CM

## 2016-08-29 DIAGNOSIS — R440 Auditory hallucinations: Secondary | ICD-10-CM

## 2016-08-29 DIAGNOSIS — F329 Major depressive disorder, single episode, unspecified: Secondary | ICD-10-CM

## 2016-08-29 DIAGNOSIS — Z87820 Personal history of traumatic brain injury: Secondary | ICD-10-CM

## 2016-08-29 DIAGNOSIS — Z86010 Personal history of colonic polyps: Secondary | ICD-10-CM

## 2016-08-29 DIAGNOSIS — H532 Diplopia: Secondary | ICD-10-CM

## 2016-08-29 DIAGNOSIS — D631 Anemia in chronic kidney disease: Secondary | ICD-10-CM

## 2016-08-29 DIAGNOSIS — E871 Hypo-osmolality and hyponatremia: Secondary | ICD-10-CM

## 2016-08-29 DIAGNOSIS — I609 Nontraumatic subarachnoid hemorrhage, unspecified: Secondary | ICD-10-CM | POA: Insufficient documentation

## 2016-08-29 DIAGNOSIS — J449 Chronic obstructive pulmonary disease, unspecified: Secondary | ICD-10-CM

## 2016-08-29 DIAGNOSIS — E876 Hypokalemia: Secondary | ICD-10-CM

## 2016-08-29 DIAGNOSIS — J45909 Unspecified asthma, uncomplicated: Secondary | ICD-10-CM

## 2016-08-29 DIAGNOSIS — M5030 Other cervical disc degeneration, unspecified cervical region: Secondary | ICD-10-CM

## 2016-08-29 DIAGNOSIS — Z5111 Encounter for antineoplastic chemotherapy: Secondary | ICD-10-CM | POA: Diagnosis not present

## 2016-08-29 DIAGNOSIS — Z8701 Personal history of pneumonia (recurrent): Secondary | ICD-10-CM

## 2016-08-29 DIAGNOSIS — C661 Malignant neoplasm of right ureter: Secondary | ICD-10-CM | POA: Diagnosis not present

## 2016-08-29 DIAGNOSIS — N1330 Unspecified hydronephrosis: Secondary | ICD-10-CM

## 2016-08-29 DIAGNOSIS — Z79899 Other long term (current) drug therapy: Secondary | ICD-10-CM

## 2016-08-30 ENCOUNTER — Inpatient Hospital Stay: Payer: 59

## 2016-08-30 NOTE — Telephone Encounter (Signed)
Sandra Landry the patient significant other called with chemo decision.  Appointments made for pt to see MD, labs and chemo on 09-04-16 at 0815.  Sandra Landry decided to wait on dietician referral and appt at this time.  Instructed to let us know if she would like to reschedule or add the dietician appointment back in.  Voiced understanding.

## 2016-08-30 NOTE — Progress Notes (Signed)
Nutrition:  Patient did not show up or call to cancel nutrition appointment today at 10:45.  Lisaann Atha B. Zenia Resides, [place], [place] (pager)

## 2016-08-31 ENCOUNTER — Ambulatory Visit
Admission: RE | Admit: 2016-08-31 | Discharge: 2016-08-31 | Disposition: A | Discharge status: Home / Self Care | Payer: 59 | Source: Ambulatory Visit | Attending: Urology | Admitting: Urology

## 2016-08-31 DIAGNOSIS — Z936 Other artificial openings of urinary tract status: Secondary | ICD-10-CM | POA: Diagnosis not present

## 2016-08-31 DIAGNOSIS — N135 Crossing vessel and stricture of ureter without hydronephrosis: Secondary | ICD-10-CM | POA: Diagnosis not present

## 2016-08-31 DIAGNOSIS — Z466 Encounter for fitting and adjustment of urinary device: Secondary | ICD-10-CM | POA: Insufficient documentation

## 2016-08-31 HISTORY — PX: IR GENERIC HISTORICAL: IMG1180011

## 2016-08-31 HISTORY — DX: Nontraumatic intracerebral hemorrhage, unspecified: I61.9

## 2016-08-31 MED ORDER — IOPAMIDOL (ISOVUE-300) INJECTION 61%
5.0000 mL | Freq: Once | INTRAVENOUS | Status: AC | PRN
  Administered 2016-08-31: 1 mL

## 2016-08-31 NOTE — OR Nursing (Signed)
No contrast allergy pre med given. Pt has no evidence of reaction. Significant other encouraged by Dr Anselm Pancoast to give pt Benedryl at home if she develops rash or itching.Encouraged to take Benedryl prophalactically in future for nephrostograms

## 2016-08-31 NOTE — Discharge Instructions (Signed)
Percutaneous Nephrostomy, Care After °This sheet gives you information about how to care for yourself after your procedure. Your health care provider may also give you more specific instructions. If you have problems or questions, contact your health care provider. °What can I expect after the procedure? °After the procedure, it is common to have: °· Some soreness where the nephrostomy tube was inserted (tube insertion site). °· Blood-tinged drainage from the nephrostomy tube for the first 24 hours. °Follow these instructions at home: °Activity °· Return to your normal activities as told by your health care provider. Ask your health care provider what activities are safe for you. °· Avoid activities that may cause the nephrostomy tubing to bend. °· Do not take baths, swim, or use a hot tub until your health care provider approves. Ask your health care provider if you can take showers. Cover the nephrostomy tube dressing with a watertight covering when you take a shower. °· Do not drive for 24 hours if you were given a medicine to help you relax (sedative). °Care of the tube insertion site °· Follow instructions from your health care provider about how to take care of your tube insertion site. Make sure you: °¨ Wash your hands with soap and water before you change your bandage (dressing). If soap and water are not available, use hand sanitizer. °¨ Change your dressing as told by your health care provider. Be careful not to pull on the tube while removing the dressing. °¨ When you change the dressing, wash the skin around the tube, rinse well, and pat the skin dry. °· Check the tube insertion area every day for signs of infection. Check for: °¨ More redness, swelling, or pain. °¨ More fluid or blood. °¨ Warmth. °¨ Pus or a bad smell. °Care of the nephrostomy tube and drainage bag °· Always keep the tubing, the leg bag, or the bedside drainage bags below the level of the kidney so that your urine drains freely. °· When  connecting your nephrostomy tube to a drainage bag, make sure that there are no kinks in the tubing and that your urine is draining freely. You may want to use an elastic bandage to wrap any exposed tubing that goes from the nephrostomy tube to any of the connecting tubes. °· At night, you may want to connect your nephrostomy tube or the leg bag to a larger bedside drainage bag. °· Follow instructions from your health care provider about how to empty or change the drainage bag. °· Empty the drainage bag when it becomes ? full. °· Replace the drainage bag and any extension tubing that is connected to your nephrostomy tube every 3 weeks or as often as told by your health care provider. Your health care provider will explain how to change the drainage bag and extension tubing. °General instructions °· Take over-the-counter and prescription medicines only as told by your health care provider. °· Keep all follow-up visits as told by your health care provider. This is important. °Contact a health care provider if: °· You have problems with any of the valves or tubing. °· You have persistent pain or soreness in your back. °· You have more redness, swelling, or pain around your tube insertion site. °· You have more fluid or blood coming from your tube insertion site. °· Your tube insertion site feels warm to the touch. °· You have pus or a bad smell coming from your tube insertion site. °· You have increased urine output or you feel burning   when urinating. °Get help right away if: °· You have pain in your abdomen during the first week. °· You have chest pain or have trouble breathing. °· You have a new appearance of blood in your urine. °· You have a fever or chills. °· You have back pain that is not relieved by your medicine. °· You have decreased urine output. °· Your nephrostomy tube comes out. °This information is not intended to replace advice given to you by your health care provider. Make sure you discuss any  questions you have with your health care provider. °Document Released: 04/26/2004 Document Revised: 06/15/2016 Document Reviewed: 06/15/2016 °Elsevier Interactive Patient Education © 2017 [employer] ° °

## 2016-09-04 ENCOUNTER — Other Ambulatory Visit: Payer: Self-pay | Admitting: Hematology and Oncology

## 2016-09-04 ENCOUNTER — Inpatient Hospital Stay: Payer: 59

## 2016-09-04 ENCOUNTER — Encounter: Payer: Self-pay | Admitting: Hematology and Oncology

## 2016-09-04 ENCOUNTER — Other Ambulatory Visit: Payer: Self-pay | Admitting: Oncology

## 2016-09-04 ENCOUNTER — Encounter: Payer: Self-pay | Admitting: Oncology

## 2016-09-04 ENCOUNTER — Inpatient Hospital Stay (HOSPITAL_BASED_OUTPATIENT_CLINIC_OR_DEPARTMENT_OTHER): Payer: 59 | Admitting: Oncology

## 2016-09-04 VITALS — BP 102/68 | HR 87 | Temp 97.7°F | Ht 66.0 in | Wt 123.0 lb

## 2016-09-04 DIAGNOSIS — J45909 Unspecified asthma, uncomplicated: Secondary | ICD-10-CM

## 2016-09-04 DIAGNOSIS — M5030 Other cervical disc degeneration, unspecified cervical region: Secondary | ICD-10-CM

## 2016-09-04 DIAGNOSIS — E871 Hypo-osmolality and hyponatremia: Secondary | ICD-10-CM

## 2016-09-04 DIAGNOSIS — N1330 Unspecified hydronephrosis: Secondary | ICD-10-CM

## 2016-09-04 DIAGNOSIS — Z8701 Personal history of pneumonia (recurrent): Secondary | ICD-10-CM

## 2016-09-04 DIAGNOSIS — R440 Auditory hallucinations: Secondary | ICD-10-CM | POA: Diagnosis not present

## 2016-09-04 DIAGNOSIS — I609 Nontraumatic subarachnoid hemorrhage, unspecified: Secondary | ICD-10-CM | POA: Insufficient documentation

## 2016-09-04 DIAGNOSIS — F419 Anxiety disorder, unspecified: Secondary | ICD-10-CM

## 2016-09-04 DIAGNOSIS — R634 Abnormal weight loss: Secondary | ICD-10-CM

## 2016-09-04 DIAGNOSIS — C679 Malignant neoplasm of bladder, unspecified: Secondary | ICD-10-CM

## 2016-09-04 DIAGNOSIS — F329 Major depressive disorder, single episode, unspecified: Secondary | ICD-10-CM

## 2016-09-04 DIAGNOSIS — C661 Malignant neoplasm of right ureter: Secondary | ICD-10-CM

## 2016-09-04 DIAGNOSIS — J449 Chronic obstructive pulmonary disease, unspecified: Secondary | ICD-10-CM

## 2016-09-04 DIAGNOSIS — D631 Anemia in chronic kidney disease: Secondary | ICD-10-CM | POA: Diagnosis not present

## 2016-09-04 DIAGNOSIS — K5900 Constipation, unspecified: Secondary | ICD-10-CM

## 2016-09-04 DIAGNOSIS — Z86010 Personal history of colonic polyps: Secondary | ICD-10-CM

## 2016-09-04 DIAGNOSIS — H532 Diplopia: Secondary | ICD-10-CM

## 2016-09-04 DIAGNOSIS — Z8619 Personal history of other infectious and parasitic diseases: Secondary | ICD-10-CM

## 2016-09-04 DIAGNOSIS — Z87820 Personal history of traumatic brain injury: Secondary | ICD-10-CM

## 2016-09-04 DIAGNOSIS — D72829 Elevated white blood cell count, unspecified: Secondary | ICD-10-CM | POA: Diagnosis not present

## 2016-09-04 DIAGNOSIS — M797 Fibromyalgia: Secondary | ICD-10-CM

## 2016-09-04 DIAGNOSIS — Z79899 Other long term (current) drug therapy: Secondary | ICD-10-CM

## 2016-09-04 DIAGNOSIS — E876 Hypokalemia: Secondary | ICD-10-CM

## 2016-09-04 DIAGNOSIS — Z5111 Encounter for antineoplastic chemotherapy: Secondary | ICD-10-CM | POA: Diagnosis not present

## 2016-09-04 DIAGNOSIS — M129 Arthropathy, unspecified: Secondary | ICD-10-CM

## 2016-09-04 DIAGNOSIS — R1110 Vomiting, unspecified: Secondary | ICD-10-CM

## 2016-09-04 DIAGNOSIS — R000 Tachycardia, unspecified: Secondary | ICD-10-CM

## 2016-09-04 DIAGNOSIS — K219 Gastro-esophageal reflux disease without esophagitis: Secondary | ICD-10-CM

## 2016-09-04 LAB — COMPREHENSIVE METABOLIC PANEL
ALT: 7 U/L — ABNORMAL LOW (ref 14–54)
AST: 15 U/L (ref 15–41)
Albumin: 3.2 g/dL — ABNORMAL LOW (ref 3.5–5.0)
Alkaline Phosphatase: 63 U/L (ref 38–126)
Anion gap: 5 (ref 5–15)
BUN: 23 mg/dL — ABNORMAL HIGH (ref 6–20)
CO2: 24 mmol/L (ref 22–32)
Calcium: 8.9 mg/dL (ref 8.9–10.3)
Chloride: 106 mmol/L (ref 101–111)
Creatinine, Ser: 1.28 mg/dL — ABNORMAL HIGH (ref 0.44–1.00)
GFR calc Af Amer: 50 mL/min — ABNORMAL LOW (ref >60)
GFR calc non Af Amer: 44 mL/min — ABNORMAL LOW (ref >60)
Glucose, Bld: 121 mg/dL — ABNORMAL HIGH (ref 65–99)
Potassium: 3.6 mmol/L (ref 3.5–5.1)
Sodium: 135 mmol/L (ref 135–145)
Total Bilirubin: 0.4 mg/dL (ref 0.3–1.2)
Total Protein: 6.6 g/dL (ref 6.5–8.1)

## 2016-09-04 LAB — CBC WITH DIFFERENTIAL/PLATELET
Basophils Absolute: 0.2 10*3/uL — ABNORMAL HIGH (ref 0–0.1)
Basophils Relative: 1 %
Eosinophils Absolute: 0.6 10*3/uL (ref 0–0.7)
Eosinophils Relative: 3 %
HCT: 27.5 % — ABNORMAL LOW (ref 35.0–47.0)
Hemoglobin: 9.1 g/dL — ABNORMAL LOW (ref 12.0–16.0)
Lymphocytes Relative: 15 %
Lymphs Abs: 3.4 10*3/uL (ref 1.0–3.6)
MCH: 27.3 pg (ref 26.0–34.0)
MCHC: 33.1 g/dL (ref 32.0–36.0)
MCV: 82.6 fL (ref 80.0–100.0)
Monocytes Absolute: 1.6 10*3/uL — ABNORMAL HIGH (ref 0.2–0.9)
Monocytes Relative: 7 %
Neutro Abs: 16.6 10*3/uL — ABNORMAL HIGH (ref 1.4–6.5)
Neutrophils Relative %: 74 %
Platelets: 344 10*3/uL (ref 150–440)
RBC: 3.33 MIL/uL — ABNORMAL LOW (ref 3.80–5.20)
RDW: 21 % — ABNORMAL HIGH (ref 11.5–14.5)
WBC: 22.4 10*3/uL — ABNORMAL HIGH (ref 3.6–11.0)

## 2016-09-04 LAB — MAGNESIUM: Magnesium: 1.9 mg/dL (ref 1.7–2.4)

## 2016-09-04 MED ORDER — POTASSIUM CHLORIDE 2 MEQ/ML IV SOLN
Freq: Once | INTRAVENOUS | Status: AC
  Administered 2016-09-04: 11:00:00 via INTRAVENOUS
  Filled 2016-09-04: qty 1000

## 2016-09-04 MED ORDER — PALONOSETRON HCL INJECTION 0.25 MG/5ML
0.2500 mg | Freq: Once | INTRAVENOUS | Status: AC
  Administered 2016-09-04: 0.25 mg via INTRAVENOUS
  Filled 2016-09-04: qty 5

## 2016-09-04 MED ORDER — SODIUM CHLORIDE 0.9 % IV SOLN
1400.0000 mg | Freq: Once | INTRAVENOUS | Status: AC
  Administered 2016-09-04: 1400 mg via INTRAVENOUS
  Filled 2016-09-04: qty 26.3

## 2016-09-04 MED ORDER — SODIUM CHLORIDE 0.9 % IJ SOLN
10.0000 mL | Freq: Once | INTRAMUSCULAR | Status: AC
  Administered 2016-09-04: 10 mL via INTRAVENOUS
  Filled 2016-09-04: qty 10

## 2016-09-04 MED ORDER — CISPLATIN CHEMO INJECTION 100MG/100ML
35.0000 mg/m2 | Freq: Once | INTRAVENOUS | Status: AC
  Administered 2016-09-04: 59 mg via INTRAVENOUS
  Filled 2016-09-04: qty 59

## 2016-09-04 MED ORDER — HEPARIN SOD (PORK) LOCK FLUSH 100 UNIT/ML IV SOLN
500.0000 [IU] | Freq: Once | INTRAVENOUS | Status: AC
  Administered 2016-09-04: 500 [IU] via INTRAVENOUS
  Filled 2016-09-04: qty 5

## 2016-09-04 MED ORDER — SODIUM CHLORIDE 0.9 % IV SOLN
Freq: Once | INTRAVENOUS | Status: AC
  Administered 2016-09-04: 13:00:00 via INTRAVENOUS
  Filled 2016-09-04: qty 5

## 2016-09-04 MED ORDER — SODIUM CHLORIDE 0.9 % IV SOLN
Freq: Once | INTRAVENOUS | Status: AC
  Administered 2016-09-04: 11:00:00 via INTRAVENOUS
  Filled 2016-09-04: qty 1000

## 2016-09-04 NOTE — Progress Notes (Signed)
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 Lower endoscopy ultrasound on 06/21/2016 revealed an infiltrative 5.6 x 4.5 cm nonobstructing mass in the rectosigmoid colon. Biopsies revealed urothelial carcinoma. Pathologic stage was T4 NX M0. Prior to that CT abdomen on 05/14/2016 revealed a 5.7 cm right lower quadrant retroperitoneal mass involving the sigmoid colon associated with chronic right ureteral obstruction by the mass with hydronephrosis and renal atrophic. Multiple subcentimeter liver lesions which were indeterminate. PET CT scan on 06/06/2016 revealed hypermetabolic with an SUV of 0000000 in the right lower quadrant retroperitoneal mass and a 4 mm nodule in the left lower lobe of unclear significance. She had a right nephrostomy tube placement on 07/11/2016. She also has anemia of chronic disease. She completed cycle 1 day 1 and day 8 neoadjuvant gemcitabine and split dose cisplatin at 35 mg/m last chemotherapy was on 08/06/2016.   Interval history- patient was last seen by Dr. Mike Gip on 08/29/2016 after hospital discharge from [facility] for a follow-up for a fall. She is here with her significant other today and reports that she is overall feeling better. Her appetite has improved although she has not gained weight. She does report pain  and discomfort in her buttocks as well as her lower abdomen which is relatively well controlled with morphine. She does have some opioid associated constipation which is managed with laxatives. She denies any further falls, fevers chills. She has been ambulating with the help of a walker and has had PT assessment but has not formally begun physical therapy ECOG PS- 2-3 Pain scale- 6 - which patient reports as tolerable and well controlled Opioid associated constipation- yes. Managed with laxatives  Review of systems- Review of Systems  Constitutional: Positive for malaise/fatigue. Negative for chills, fever and weight loss.  HENT: Negative for congestion, ear discharge and nosebleeds.   Eyes: Negative for blurred vision.  Respiratory: Negative for cough, hemoptysis, sputum production, shortness of breath and wheezing.   Cardiovascular: Negative for chest pain, palpitations, orthopnea and claudication.  Gastrointestinal: Positive for abdominal pain (hollow). Negative for blood in stool, constipation, diarrhea, heartburn, melena, nausea and vomiting.  Genitourinary: Negative for dysuria, flank pain, frequency, hematuria and urgency.  Musculoskeletal: Positive for back pain (and pain in buttocks). Negative for joint pain and myalgias.  Skin: Negative for rash.  Neurological: Negative for dizziness, tingling, focal weakness, seizures, weakness and headaches.  Endo/Heme/Allergies: Does not bruise/bleed easily.  Psychiatric/Behavioral: Negative for depression and suicidal ideas. The patient does not have insomnia.      Current treatment- chemotherapy as above  Allergies  Allergen Reactions   Other Swelling    Dust, ragweed   Gabapentin Other (See Comments)    Jerking motions and confusion.   Iodides    Tetanus Toxoids Other (See Comments)    Localized pain and swelling   Iodine Other (See Comments)  and Rash    Limited history. Occurred in 65s, at age 73-12, received contrast at  public health facility in [place], while being evaluated for meningitis. Immediate rash - not itchy, "flat, red, perfect circles" over entire body. Has not received iodine contrast since.     Past Medical History:  Diagnosis Date   Anxiety    Arthritis    Asthma    mild   Cancer ([place])    urothelia   Cervical disc disease    COPD (chronic obstructive pulmonary disease) (HCC)    mild   Depression    Dysrhythmia    tachycardia   Fibromyalgia    GERD (gastroesophageal reflux disease)    Hepatitis C    History of hiatal hernia    Intracerebral hemorrhage ([place])      Past Surgical History:  Procedure Laterality Date   BREAST REDUCTION SURGERY     cervical discectomy with fusion     COLONOSCOPY  01/2016   EUS N/A 06/21/2016   Procedure: LOWER ENDOSCOPIC ULTRASOUND (EUS);  Surgeon: Reita Cliche, MD;  Location: [facility] ENDOSCOPY;  Service: Endoscopy;  Laterality: N/A;   IR GENERIC HISTORICAL  07/11/2016   IR NEPHROSTOMY PLACEMENT RIGHT 07/11/2016 Greggory Keen, MD [facility]INTER[facility] RAD   IR GENERIC HISTORICAL  08/31/2016   IR NEPHROSTOMY TUBE CHANGE 08/31/2016 [facility]INTER[facility]   PERIPHERAL VASCULAR CATHETERIZATION N/A 07/23/2016   Procedure: Glori Luis Cath Insertion;  Surgeon: Algernon Huxley, MD;  Location: [place] CV LAB;  Service: Cardiovascular;  Laterality: N/A;    Social History   Social History   Marital status: Single    Spouse name: N/A   Number of children: N/A   Years of education: N/A   Occupational History   Not on file.   Social History Main Topics   Smoking status: Never Smoker   Smokeless tobacco: Never Used   Alcohol use No   Drug use:     Frequency: 14.0 times per week    Types: Marijuana     Comment: smoked this morning   Sexual activity: Not on file   Other Topics Concern   Not on file   Social History Narrative   No narrative on file    No family history on file.   Current Outpatient Prescriptions:    baclofen  (LIORESAL) 20 MG tablet, Take 20 mg by mouth 4 (four) times daily as needed for muscle spasms. , Disp: , Rfl:    dexamethasone (DECADRON) 4 MG tablet, Take 2 tablets by mouth once a day on the day after cisplatin chemotherapy and then take 2 tablets two times a day for 2 days. Take with food., Disp: 30 tablet, Rfl: 1   diltiazem (CARDIZEM CD) 120 MG 24 hr capsule, Take 240 mg by mouth at bedtime. , Disp: , Rfl:    diphenhydrAMINE (BENADRYL) 25 MG tablet, Take 25-50 mg by mouth every 6 (six) hours as needed for allergies. , Disp: , Rfl:    escitalopram (LEXAPRO) 20 MG tablet, Take 40 mg by mouth at bedtime. , Disp: , Rfl:    ferrous sulfate 325 (65 FE) MG tablet, Take 325 mg by mouth daily with breakfast., Disp: , Rfl:    Iron-Vitamin C (VITRON-C PO), Take 1 tablet by mouth daily., Disp: , Rfl:    lactulose (CHRONULAC) 10 GM/15ML solution, Take 10 g by mouth 2 (two) times daily. , Disp: , Rfl:    lidocaine-prilocaine (EMLA) cream, Apply cream to port site 1 hour prior to  chemotherapy, place small amount of saran wrap to cover cream and protect clothing., Disp: 30 g, Rfl: 1   LORazepam (ATIVAN) 0.5 MG tablet, Take 1 tablet (0.5 mg total) by mouth every 6 (six) hours as needed (Nausea or vomiting)., Disp: 30 tablet, Rfl: 0   megestrol (MEGACE) 400 MG/10ML suspension, Take 5 mLs (200 mg total) by mouth daily., Disp: 240 mL, Rfl: 0   morphine (MS CONTIN) 15 MG 12 hr tablet, Take 15 mg by mouth 2 (two) times daily., Disp: , Rfl:    ondansetron (ZOFRAN) 8 MG tablet, Take 1 tablet (8 mg total) by mouth 2 (two) times daily as needed. Start on the third day after cisplatin chemotherapy., Disp: 30 tablet, Rfl: 1   oxyCODONE-acetaminophen (PERCOCET/ROXICET) 5-325 MG tablet, Take 1 tablet by mouth every 4 (four) hours as needed for moderate pain or severe pain. , Disp: , Rfl:    polyethylene glycol (MIRALAX / GLYCOLAX) packet, Take 17 g by mouth daily as needed for mild constipation. , Disp: , Rfl:      potassium chloride (K-DUR) 10 MEQ tablet, Take 1 tablet (10 mEq total) by mouth daily., Disp: 30 tablet, Rfl: 1   prochlorperazine (COMPAZINE) 5 MG/ML injection, Inject 10 mg into the vein every 4 (four) hours as needed (nausea). , Disp: , Rfl:    promethazine (PHENERGAN) 25 MG suppository, Place 25 mg rectally every 6 (six) hours as needed for nausea or vomiting. , Disp: , Rfl:    ranitidine (ZANTAC) 300 MG tablet, Take 300 mg by mouth 2 (two) times daily. , Disp: , Rfl:  No current facility-administered medications for this visit.   Facility-Administered Medications Ordered in Other Visits:    sodium chloride flush (NS) 0.9 % injection 10 mL, 10 mL, Intravenous, PRN, Lequita Asal, MD, 10 mL at 07/26/16 0935  Physical exam:  Vitals:   09/04/16 0940  BP: 102/68  Pulse: 87  Temp: 97.7 F (36.5 C)  TempSrc: Tympanic  Weight: 123 lb 0.3 oz (55.8 kg)  Height: 5\' 6"  (1.676 m)  And I will tomorrow for chart and in close the chart completely and, in Physical Exam  Constitutional: She is oriented to person, place, and time.  Patient is frail-appearing but appears in no acute distress. She is laying on her side on the examination table because of discomfort in her buttocks  HENT:  Head: Normocephalic and atraumatic.  Eyes: EOM are normal. Pupils are equal, round, and reactive to light.  Neck: Normal range of motion.  Cardiovascular: Normal rate, regular rhythm and normal heart sounds.   Pulmonary/Chest: Effort normal and breath sounds normal.  Abdominal: Soft. Bowel sounds are normal.  Neurological: She is alert and oriented to person, place, and time.  Skin: Skin is warm and dry.     CMP Latest Ref Rng & Units 08/22/2016  Glucose 65 - 99 mg/dL 106(H)  BUN 6 - 20 mg/dL 10  Creatinine 0.44 - 1.00 mg/dL 1.28(H)  Sodium 135 - 145 mmol/L 134(L)  Potassium 3.5 - 5.1 mmol/L 4.0  Chloride 101 - 111 mmol/L 102  CO2 22 - 32 mmol/L 23  Calcium 8.9 - 10.3 mg/dL 9.0  Total Protein  6.5 - 8.1 g/dL 6.9  Total Bilirubin 0.3 - 1.2 mg/dL 0.6  Alkaline Phos 38 - 126 U/L 86  AST 15 - 41 U/L 16  ALT 14 - 54 U/L 7(L)   CBC Latest Ref Rng & Units 09/04/2016  WBC 3.6 - 11.0 K/uL 22.4(H)  Hemoglobin  12.0 - 16.0 g/dL 9.1(L)  Hematocrit 35.0 - 47.0 % 27.5(L)  Platelets 150 - 440 K/uL 344     Ct Head Wo Contrast  Result Date: 08/29/2016 CLINICAL DATA:  Three-week history of headaches.  Fell 3 weeks ago. EXAM: CT HEAD WITHOUT CONTRAST TECHNIQUE: Contiguous axial images were obtained from the base of the skull through the vertex without intravenous contrast. COMPARISON:  08/08/2016 FINDINGS: Brain: The ventricles are normal in size and configuration. No extra-axial fluid collections are identified. The gray-white differentiation is normal. No CT findings for acute intracranial process such as hemorrhage or infarction. No mass lesions. The brainstem and cerebellum are grossly normal. Vascular: No hyperdense vessel or unexpected calcification. Skull: Normal. Negative for fracture or focal lesion. Sinuses/Orbits: The paranasal sinuses and mastoid air cells are clear. The globes are intact. Other: No scalp lesion or hematoma. IMPRESSION: No acute intracranial findings. No persistent or recurrent intracranial hemorrhage. Electronically Signed   By: Marijo Sanes M.D.   On: 08/29/2016 16:07   Ct Head Wo Contrast  Result Date: 08/08/2016 CLINICAL DATA:  Fall this morning in was found unresponsive. Hematoma to the back of the head. History of ureteral cancer. EXAM: CT HEAD WITHOUT CONTRAST CT CERVICAL SPINE WITHOUT CONTRAST TECHNIQUE: Multidetector CT imaging of the head and cervical spine was performed following the standard protocol without intravenous contrast. Multiplanar CT image reconstructions of the cervical spine were also generated. COMPARISON:  CT head 06/01/2016 FINDINGS: CT HEAD FINDINGS Brain: Acute intracranial hemorrhage is present. There are poorly defined areas of increased  density in the subcortical white matter of the left posterior parietal and anterior medial frontal regions consistent with venous shear injury. There is increased density around the left side of the foramen magnum and extending to the level of the cerebellopontine angles. This likely represents acute subdural hematoma. This causes some with effacement and displacement of the brainstem. Mild diffuse cerebral atrophy. No ventricular dilatation. No midline shift. Gray-white matter junctions are distinct. Basal cisterns are not effaced. Vascular: No hyperdense vessel or unexpected calcification. Skull: Normal. Negative for fracture or focal lesion. Sinuses/Orbits: No acute finding. Other: Large subcutaneous scalp hematoma over the left posterior parietal region. CT CERVICAL SPINE FINDINGS Alignment: Normal alignment of the cervical spine and facet joints. Skull base and vertebrae: No acute fracture. No primary bone lesion or focal pathologic process. Soft tissues and spinal canal: Extra-axial hemorrhage or hematoma extends along the foramen magnum down to the level of the body of C2. Disc levels: Postoperative changes with anterior plate and screw fixation and intervertebral fusion from C5 through C7. Degenerative changes at C3-4 and C4-5 levels. Degenerative changes at C1 to. Upper chest: Cardiac pacemaker.  Lung apices are clear. Other: None. IMPRESSION: Patchy areas of poorly defined parenchymal hemorrhage in the left posterior parietal and left anterior medial frontal regions consistent with venous shear injuries. Extra-axial hemorrhage demonstrated in the foramen magnum and extending along the upper cervical canal to the level of the body of C2. This causes effacement of CSF space and displacement of the brainstem and proximal cervical cord. Postoperative and degenerative changes in the cervical spine. No acute displaced fractures identified. These results were called by telephone at the time of interpretation on  08/08/2016 at 6:05 am to Dr. Charlesetta Ivory , who verbally acknowledged these results. Electronically Signed   By: Lucienne Capers M.D.   On: 08/08/2016 06:09   Ct Cervical Spine Wo Contrast  Result Date: 08/08/2016 CLINICAL DATA:  Fall this morning in was  found unresponsive. Hematoma to the back of the head. History of ureteral cancer. EXAM: CT HEAD WITHOUT CONTRAST CT CERVICAL SPINE WITHOUT CONTRAST TECHNIQUE: Multidetector CT imaging of the head and cervical spine was performed following the standard protocol without intravenous contrast. Multiplanar CT image reconstructions of the cervical spine were also generated. COMPARISON:  CT head 06/01/2016 FINDINGS: CT HEAD FINDINGS Brain: Acute intracranial hemorrhage is present. There are poorly defined areas of increased density in the subcortical white matter of the left posterior parietal and anterior medial frontal regions consistent with venous shear injury. There is increased density around the left side of the foramen magnum and extending to the level of the cerebellopontine angles. This likely represents acute subdural hematoma. This causes some with effacement and displacement of the brainstem. Mild diffuse cerebral atrophy. No ventricular dilatation. No midline shift. Gray-white matter junctions are distinct. Basal cisterns are not effaced. Vascular: No hyperdense vessel or unexpected calcification. Skull: Normal. Negative for fracture or focal lesion. Sinuses/Orbits: No acute finding. Other: Large subcutaneous scalp hematoma over the left posterior parietal region. CT CERVICAL SPINE FINDINGS Alignment: Normal alignment of the cervical spine and facet joints. Skull base and vertebrae: No acute fracture. No primary bone lesion or focal pathologic process. Soft tissues and spinal canal: Extra-axial hemorrhage or hematoma extends along the foramen magnum down to the level of the body of C2. Disc levels: Postoperative changes with anterior plate and screw  fixation and intervertebral fusion from C5 through C7. Degenerative changes at C3-4 and C4-5 levels. Degenerative changes at C1 to. Upper chest: Cardiac pacemaker.  Lung apices are clear. Other: None. IMPRESSION: Patchy areas of poorly defined parenchymal hemorrhage in the left posterior parietal and left anterior medial frontal regions consistent with venous shear injuries. Extra-axial hemorrhage demonstrated in the foramen magnum and extending along the upper cervical canal to the level of the body of C2. This causes effacement of CSF space and displacement of the brainstem and proximal cervical cord. Postoperative and degenerative changes in the cervical spine. No acute displaced fractures identified. These results were called by telephone at the time of interpretation on 08/08/2016 at 6:05 am to Dr. Charlesetta Ivory , who verbally acknowledged these results. Electronically Signed   By: Lucienne Capers M.D.   On: 08/08/2016 06:09   Ir Nephrostomy Tube Change  Result Date: 08/31/2016 INDICATION: 65 year old with urothelial carcinoma and history of right hydronephrosis. Patient presents for routine nephrostomy tube exchange. No problems with the nephrostomy tube. EXAM: EXCHANGE OF RIGHT NEPHROSTOMY TUBE WITH FLUOROSCOPY MEDICATIONS: None ANESTHESIA/SEDATION: None CONTRAST:  50mL ISOVUE-300 IOPAMIDOL (ISOVUE-300) INJECTION 61% - administered into the collecting system(s) FLUOROSCOPY TIME:  Fluoroscopy Time: 18 seconds, 1 mGy COMPLICATIONS: None immediate. PROCEDURE: The procedure was explained to the patient. The risks and benefits of the procedure were discussed and the patient's questions were addressed. Informed consent was obtained from the patient. Patient was placed prone. The right flank was prepped and draped in a sterile fashion. Maximal barrier sterile technique was utilized including caps, mask, sterile gowns, sterile gloves, sterile drape, hand hygiene and skin antiseptic. Small amount of dilute  contrast was injected through the right nephrostomy tube. Catheter was cut and removed over a Bentson wire. A new 10.2 [place] multipurpose drain was reconstituted in the right renal pelvis. Small amount of dilute contrast was injected to confirm placement in the collecting system. Skin was anesthetized with 1% lidocaine. Catheter sutured to skin. Fluoroscopic images were taken and saved for this procedure. FINDINGS: Nephrostomy tube is positioned in  the right renal pelvis. IMPRESSION: Successful exchange of the right percutaneous nephrostomy tube with fluoroscopy. Patient has an iodine allergy but had no complications with this nephrostomy tube exchange using a small quantity of dilute contrast. Patient was not pre-medicated for this procedure. Electronically Signed   By: Markus Daft M.D.   On: 08/31/2016 12:29     Assessment and plan- Patient is a 65 y.o. female with a history of locally advanced right urethral carcinoma status post cycle 1 day 1 and day 8 of gemcitabine and cisplatin.  1. I discussed at length with the patient and her partner the results of her recent CT abdomen done at [facility] which did show that her bladder mass appeared close to 7 cm which was larger than her initial CT scan August 2017 although both of this CT scans were not formally compared. The mass also appeared to involve the psoas muscle and causing pathologic fracture of her S1 vertebra. Patient is having pain and discomfort in her buttocks. I discussed the role of palliative radiation to see if her pain can be improved versus continuing with chemotherapy in an attempt to shrink the mass. Given how locally aggressive the mass is, I'm not sure if she would be a surgical candidate in the future. Up performance status remains borderline but overall she feels better and wants to pursue chemotherapy. Hence we will keep radiation option in the back burner for now and proceed with cycle 2 day 1 of chemotherapy with gemcitabine and however I  will reduce the dose to 800 mg/m given her decline in performance status and recent hospitalization and continue cisplatin at split dose 35 mg/m. I'll see her back in 1 week's time for day 8 of chemotherapy. She knows to call if she were to have any worsening side effects of chemotherapy including all but not limited to fever, nausea or vomiting or signs and symptoms of infection.   Total face to face encounter time for this patient visit was 30 min. >50% of the time was  spent in counseling and coordination of care. We spent time discussing benefits of proceeding with chemotherapy versus radiation at this time and patient chooses to proceed with chemotherapy.    Visit Diagnosis 1. Bladder carcinoma ([place])      Dr. Randa Evens, MD, MPH [place] [facility]  09/04/2016 8:04 AM

## 2016-09-04 NOTE — Progress Notes (Signed)
[facility] day:  08/29/2016  Chief Complaint: Sandra Landry is a 65 y.o. female with locally advanced right ureteral cancer who is seen for 1 week assessment.  HPI: The patient was last seen in the medical oncology clinic on 08/22/2016.  At that time, she was seen for assessment following an interval hospitalization at [facility].  She was admitted to [facility] from 08/08/2016 - 08/17/2016 following a fall.  Head MRI revealed subarachnoid hemorrhage.  She was dehydrated (Cr 1.94).  While hospitalized she had atrial fibrillation with RVR.  She declined skilled nursing on discharge.  She was sleeping 18 hours a day.  Oral intake was poor.  We discussed treatment or supportive care.  She wished to go home.  She states that she received a liter of fluids on 08/27/2016 through Dr. Clemmie Krill. Physical therapy is coming out. She is doing more and is confident. She is still weak and unsteady on her feet.  She is doing some cooking. She is getting dressed "okay".  She is performing her activities of daily living.  She has occasional emesis.  She continues to have phlegm production. She spends about 90% of the day resting.  She notes some auditory hallucinations.  She is drinking water and juices.  She is eating soups.  Nephrostomy tube exchange is scheduled for 08/31/2016.   Past Medical History:  Diagnosis Date  . Anxiety   . Arthritis   . Asthma    mild  . Cervical disc disease   . COPD (chronic obstructive pulmonary disease) (HCC)    mild  . Depression   . Dysrhythmia    tachycardia  . Fibromyalgia   . GERD (gastroesophageal reflux disease)   . Hepatitis C     Past Surgical History:  Procedure Laterality Date  . cervical discectomy with fusion    . COLONOSCOPY  01/2016  . EUS N/A 06/21/2016   Procedure: LOWER ENDOSCOPIC ULTRASOUND (EUS);  Surgeon: Reita Cliche, MD;  [facility];  Service: Endoscopy;  Laterality: N/A;    No family history on  file.  Social History:  reports that she has never smoked. She has never used smokeless tobacco. She reports that she uses drugs, including Marijuana. She reports that she does not drink alcohol.  She lives in [place].  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  She has also worked in the Physicist, medical.  Her partner is Darliss Ridgel, a Marine scientist.  The patient is accompanied by a friend, Almyra Free.  Allergies:  Allergies  Allergen Reactions  . Other Swelling    Dust, ragweed  . Iodides   . Tetanus Toxoids Other (See Comments)    Localized pain and swelling  . Iodine Other (See Comments) and Rash    Limited history. Occurred in 65s, at age 61-12, received contrast at public health facility in [place], while being evaluated for meningitis. Immediate rash - not itchy, "flat, red, perfect circles" over entire body. Has not received iodine contrast since.    Current Medications: Current Outpatient Prescriptions  Medication Sig Dispense Refill  . baclofen (LIORESAL) 20 MG tablet Take 20 mg by mouth 4 (four) times daily as needed for muscle spasms.     . cetirizine (ZYRTEC) 10 MG tablet Take 10 mg by mouth daily.     Marland Kitchen diltiazem (CARDIZEM CD) 120 MG 24 hr capsule Take 120 mg by mouth daily.     . diphenhydrAMINE (BENADRYL) 25 MG tablet Take 25-50 mg  by mouth every 6 (six) hours as needed.    Marland Kitchen escitalopram (LEXAPRO) 20 MG tablet Take 20 mg by mouth daily.     Marland Kitchen ibuprofen (ADVIL,MOTRIN) 200 MG tablet Take 200-600 mg by mouth every 6 (six) hours as needed for mild pain or moderate pain.    Marland Kitchen lactulose (CHRONULAC) 10 GM/15ML solution     . oxyCODONE-acetaminophen (PERCOCET/ROXICET) 5-325 MG tablet     . polyethylene glycol (MIRALAX / GLYCOLAX) packet Take 17 g by mouth daily as needed.     . prochlorperazine (COMPAZINE) 5 MG/ML injection Inject 5-10 mg into the vein as directed. Every 4 to 6 hours as needed.    . promethazine (PHENERGAN) 25 MG suppository Place 25 mg rectally as directed. Every 4 to 6  hours as needed    . ranitidine (ZANTAC) 300 MG tablet Take 300 mg by mouth daily.     . traMADol (ULTRAM) 50 MG tablet Take 50-100 mg by mouth every 4 (four) hours as needed.     . potassium chloride (K-DUR) 10 MEQ tablet Take 2 tablets (20 mEq total) by mouth daily. 28 tablet 0  . potassium chloride (MICRO-K) 10 MEQ CR capsule      No current facility-administered medications for this visit.     Review of Systems:  GENERAL:  Fatigue.  No fever or sweats.  Weight l stable since last visit. PERFORMANCE STATUS (ECOG):  2 HEENT:  Decreased vision in left eye since fall (diplopia when eye open).  No runny nose, sore throat, mouth sores or tenderness. Lungs:  No shortness of breath or cough.  No hemoptysis. Cardiac:  No chest pain, palpitations, orthopnea, or PND. GI:  Appetite better (eating soups).  Chronic nausea.  Vomiting on occasion.  No diarrhea, constipationmelena or hematochezia. GU:  Right urostomy tube.  No urgency, frequency, or dysuria. Musculoskeletal:  Right sided back pain.  No joint pain.  No muscle tenderness. Extremities:  No pain or swelling. Skin:  No rashes or skin changes. Neuro:  Auditory hallucinations.  Unsteady on feet.  No numbness or weakness, or coordination issues. Endocrine:  No diabetes, thyroid issues, hot flashes or night sweats. Psych:  Hypersensitive.  PTSD.  Depression.  Anxiety. Pain:  Low back pain. Review of systems:  All other systems reviewed and found to be negative.  Physical Exam: BP 112/85 (BP Location: Left Arm, Patient Position: Sitting)   Pulse (!) 137   Temp 97.7 F (36.5 C) (Tympanic)   Resp 18   Wt 122 lb 3 oz (55.4 kg)   BMI 19.72 kg/m  Blood pressure 94/62, pulse 65, temperature (!) 95.1 F (35.1 C), temperature source Tympanic, resp. rate 18, weight 136 lb 0.4 oz (61.7 kg). GENERAL:  Thin, fatigued appearing woman who is sitting in a wheelchair in the exam room in no acute distress.  MENTAL STATUS:  Alert and oriented to  person, place and time. HEAD:  Shaved head.  Normocephalic, atraumatic, face symmetric, no Cushingoid features. EYES:  Pupils equal round and reactive to light and accomodation.  No conjunctivitis or scleral icterus.  Intermittently closes left eye. ENT:  Oropharynx clear without lesion.  No upper teeth.  Tongue normal. Mucous membranes moist.  RESPIRATORY:  Clear to auscultation without rales, wheezes or rhonchi. CARDIOVASCULAR:  Regular rate and rhythm without murmur, rub or gallop. ABDOMEN:  Soft, nontender with active bowel sounds and no hepatosplenomegaly.   BACK:  Right sided urostomy with dressing in place. SKIN:  No rashes, ulcers or lesions.  EXTREMITIES: No edema, no skin discoloration or tenderness.  No palpable cords. LYMPH NODES: No palpable cervical, supraclavicular, axillary or inguinal adenopathy  NEUROLOGICAL:  Alert & oriented, cranial nerves II-XII intact except diplopia unless left eye closed; motor strength symmetric; sensation intact; finger to nose and RAM slightly difficult. PSYCH:  Appropriate.   No visits with results within 3 Day(s) from this visit.  Latest known visit with results is:  Appointment on 08/22/2016  Component Date Value Ref Range Status  . WBC 08/22/2016 6.7  3.6 - 11.0 K/uL Final  . RBC 08/22/2016 3.89  3.80 - 5.20 MIL/uL Final  . Hemoglobin 08/22/2016 10.5* 12.0 - 16.0 g/dL Final  . HCT 08/22/2016 31.4* 35.0 - 47.0 % Final  . MCV 08/22/2016 80.9  80.0 - 100.0 fL Final  . MCH 08/22/2016 27.0  26.0 - 34.0 pg Final  . MCHC 08/22/2016 33.3  32.0 - 36.0 g/dL Final  . RDW 08/22/2016 18.1* 11.5 - 14.5 % Final  . Platelets 08/22/2016 839* 150 - 440 K/uL Final  . Neutrophils Relative % 08/22/2016 45  % Final  . Neutro Abs 08/22/2016 3.0  1.4 - 6.5 K/uL Final  . Lymphocytes Relative 08/22/2016 38  % Final  . Lymphs Abs 08/22/2016 2.5  1.0 - 3.6 K/uL Final  . Monocytes Relative 08/22/2016 14  % Final  . Monocytes Absolute 08/22/2016 0.9  0.2 - 0.9 K/uL  Final  . Eosinophils Relative 08/22/2016 2  % Final  . Eosinophils Absolute 08/22/2016 0.1  0 - 0.7 K/uL Final  . Basophils Relative 08/22/2016 1  % Final  . Basophils Absolute 08/22/2016 0.1  0 - 0.1 K/uL Final  . Sodium 08/22/2016 134* 135 - 145 mmol/L Final  . Potassium 08/22/2016 4.0  3.5 - 5.1 mmol/L Final  . Chloride 08/22/2016 102  101 - 111 mmol/L Final  . CO2 08/22/2016 23  22 - 32 mmol/L Final  . Glucose, Bld 08/22/2016 106* 65 - 99 mg/dL Final  . BUN 08/22/2016 10  6 - 20 mg/dL Final  . Creatinine, Ser 08/22/2016 1.28* 0.44 - 1.00 mg/dL Final  . Calcium 08/22/2016 9.0  8.9 - 10.3 mg/dL Final  . Total Protein 08/22/2016 6.9  6.5 - 8.1 g/dL Final  . Albumin 08/22/2016 3.5  3.5 - 5.0 g/dL Final  . AST 08/22/2016 16  15 - 41 U/L Final  . ALT 08/22/2016 7* 14 - 54 U/L Final  . Alkaline Phosphatase 08/22/2016 86  38 - 126 U/L Final  . Total Bilirubin 08/22/2016 0.6  0.3 - 1.2 mg/dL Final  . GFR calc non Af Amer 08/22/2016 44* >60 mL/min Final  . GFR calc Af Amer 08/22/2016 50* >60 mL/min Final   Comment: (NOTE) The eGFR has been calculated using the CKD EPI equation. This calculation has not been validated in all clinical situations. eGFR's persistently <60 mL/min signify possible Chronic Kidney Disease.   . Anion gap 08/22/2016 9  5 - 15 Final    No visits with results within 3 Day(s) from this visit.  Latest known visit with results is:  Admission on 06/21/2016, Discharged on 06/21/2016  Component Date Value Ref Range Status  . SURGICAL PATHOLOGY 06/27/2016    Final                   Value:Surgical Pathology CASE: [id number] PATIENT: Sandra Landry Surgical Pathology Report     SPECIMEN SUBMITTED: A. Recto-sigmoid mass; cbx  CLINICAL HISTORY: None provided  PRE-OPERATIVE DIAGNOSIS: Right pelvic retroperitoneal  mass.  Needs biopsy  POST-OPERATIVE DIAGNOSIS: Invasive peri-rectal mass     DIAGNOSIS: A. RECTOSIGMOID MASS; COLD BIOPSY: - UROTHELIAL  CARCINOMA UNDERMINING COLONIC MUCOSA.  Comment:  A limited panel of immunohistochemical stains was performed. The neoplastic cells have the following immunoreactivity: Super pancytokeratin: Positive p40: Positive GATA-3: Positive SOX-1: Negative CD45: Negative Vimentin: Negative DOG-1: Negative PAX-8: Negative CDX-2: Negative p16: Negative (high background) These findings support the above diagnosis. These results were communicated to Dr. Mike Gip on 06/27/2016. Stain controls worked appropriately.  GROSS DESCRIPTION: A. Labeled: rectosigmoid mass C BX Tissue fragment(s): multiple S                         ize: aggregate, 0.8 x 0.6 x 0.1 cm Description: tan fragments  Entirely submitted in 1 cassette(s).  Final Diagnosis performed by Quay Burow, MD.  Electronically signed 06/27/2016 4:00:48PM    The electronic signature indicates that the named Attending Pathologist has evaluated the specimen  Technical component performed at [facility], [address] [place], [place] [address] Lab: [phone] Dir: Darrick Penna. Evette Doffing, MD  Professional component performed at [facility], [facility], [place], [place], [place] [address] Lab: [phone] Dir: Dellia Nims. Reuel Derby, MD      Assessment:  Sandra Landry is a 65 y.o. 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  She has a history of lower abdomen for 2 years felt secondary to irritable bowel syndrome.  She has had RLQ pain which has extended to her right buttock and hip for 3 months.  She has lost 38 pounds in the past year.  Lower endoscopic ultrasound on 06/21/2016 revealed an infiltrative 5.6 x 4.5 cm non-obstructing large mass in the recto-sigmoid colon extendiing from 14 cm to 18 cm from the anal verge. The mass was non-circumferential.  Biopsies revealed a urothelial carcinoma.  Pathologic stage was T4NxM0.  Abdominal and pelvic CT scan on 05/14/2016 revealed a 5.7 cm right  lower quadrant retroperitoneal mass highly concerning for malignancy. The mass involved the sigmoid colon but is favored to reflect a primary retroperitoneal tumor (such as sarcoma) over a primary colonic mass.  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  There were multiple subcentimeter liver lesions, indeterminate.   PET scan on 06/06/2016 revealed hypermetabolism (SUV 19.7)  corresponding to the right lower quadrant retroperitoneal mass. It was felt intimately associated with the rectosigmoid colon, but originating outside of the colon. There was similar right-sided hydroureteronephrosis due to the right lower quadrant mass.  There was a 4 mm nodule in the left lower lobe (unclear significance).  Colonoscopy at [facility] on 07/18/2015 revealed 3 adenomatous polyps.  She underwent right nephrostomy tube placement on 07/11/2016  She has a normocytic anemia.  Labs on 07/26/2016 revealed a ferritin (284), iron saturation (4%), TIBC 179 (low) B12 (628), folate (7.1), and ESR (54).  She has had some blood from her nephrostomy tube.  She has anemia of chronic disease.  Diet is poor.  She received 1 unit PRBCs on 08/01/2016.  She has leukocytosis secondary to her underlying malignancy.  Work-up on 07/26/2016 revealed a negative urinalysis, blood cultures, and CXR.  Urinalysis revealed 20,000 colonies/ml Klebsiella pneumonia.  She received a course of ciprofloxacin.  Urine culture on 08/06/2016 revealed Stenotrophomonas maltophilia.  She was treated with a course of Levaquin.  Prior to treatment, she lost 35+ pounds.  She began Megace on 07/26/2016.  She has lost  an additional 12 pounds with her recent admission to [facility].  She received 1 cycle #1 cisplatin and gemcitabine (07/30/2016 - 08/06/2016).  Cisplatin was given as a split dose (day 1 and 2).  Course was complicated by an admission to [facility] on 08/08/2016 following a fall.  Head MRI revealed a subarachnoid  hemorrhage.  She had atrial fibrillation with RVR.  Chest, abdomen, and pelvic CT scan on 08/08/2016 revealed a mild age-indeterminate compression fracture of T6. There was a 6 mm hypodensity at the dome of the liver too small to characterize and a few additional small subcentimeter hypodense scattered lesions in the liver. There was a 7.3 x 6.4 x 7.7 cm right pelvic mass invading the sacrum and causing a lytic lesion with pathologic fracture.  The mass likely invaded the right psoas muscle.  Symptomatically, she remains weak.  She is resting 90% of day.  She is eating better.  She has had auditory hallucinations.  She has diplopia unless her left eye is closed.  Plan: 1.  Discuss patient's thoughts about therapy.  She wishes to pursue treatment.  She is not interested in Hospice.  Discuss when she would like to pursue therapy.  She is undecided.  She will contact the clinic after further discussions at home. 2.  Discuss importance of good hydration.  Discuss home IVF arranged by Dr Clemmie Krill.  Previously, we had been unable to set up fluids at home.  Will contact Dr. Eula Flax office regarding continued IVF with chemotherapy and clinic parameters.  3.  Head CT without contrast today 4.  Patient to call re: next chemotherapy start date.  Addendum:  Chest, abdomen, and pelvic CT scan results from [facility] became available after her appointment.  Will need to discuss with patient and her partner.  Head CT revealed no acute findings.  There was no persistent or recurrent intracranial hemorrhage.   Lequita Asal, MD 08/29/2016

## 2016-09-04 NOTE — Progress Notes (Signed)
Feeling better today, drinking better.

## 2016-09-05 ENCOUNTER — Inpatient Hospital Stay: Payer: 59

## 2016-09-05 VITALS — BP 98/62 | HR 89 | Temp 96.5°F | Resp 18

## 2016-09-05 DIAGNOSIS — Z5111 Encounter for antineoplastic chemotherapy: Secondary | ICD-10-CM | POA: Diagnosis not present

## 2016-09-05 DIAGNOSIS — C661 Malignant neoplasm of right ureter: Secondary | ICD-10-CM

## 2016-09-05 MED ORDER — DEXAMETHASONE SODIUM PHOSPHATE 10 MG/ML IJ SOLN
10.0000 mg | Freq: Once | INTRAMUSCULAR | Status: AC
  Administered 2016-09-05: 10 mg via INTRAVENOUS
  Filled 2016-09-05: qty 1

## 2016-09-05 MED ORDER — SODIUM CHLORIDE 0.9 % IV SOLN
35.0000 mg/m2 | Freq: Once | INTRAVENOUS | Status: AC
  Administered 2016-09-05: 59 mg via INTRAVENOUS
  Filled 2016-09-05: qty 59

## 2016-09-05 MED ORDER — POTASSIUM CHLORIDE 2 MEQ/ML IV SOLN
Freq: Once | INTRAVENOUS | Status: AC
  Administered 2016-09-05: 10:00:00 via INTRAVENOUS
  Filled 2016-09-05: qty 1000

## 2016-09-05 MED ORDER — SODIUM CHLORIDE 0.9 % IV SOLN
Freq: Once | INTRAVENOUS | Status: AC
  Administered 2016-09-05: 10:00:00 via INTRAVENOUS
  Filled 2016-09-05: qty 1000

## 2016-09-05 MED ORDER — HEPARIN SOD (PORK) LOCK FLUSH 100 UNIT/ML IV SOLN
500.0000 [IU] | Freq: Once | INTRAVENOUS | Status: AC | PRN
  Administered 2016-09-05: 500 [IU]
  Filled 2016-09-05 (×2): qty 5

## 2016-09-05 MED ORDER — SODIUM CHLORIDE 0.9 % IV SOLN: 10.0000 mg | Freq: Once | INTRAVENOUS | Status: DC

## 2016-09-05 MED ORDER — SODIUM CHLORIDE 0.9% FLUSH
10.0000 mL | INTRAVENOUS | Status: DC | PRN
  Administered 2016-09-05: 10 mL
  Filled 2016-09-05: qty 10

## 2016-09-11 ENCOUNTER — Other Ambulatory Visit: Payer: Self-pay

## 2016-09-11 ENCOUNTER — Inpatient Hospital Stay (HOSPITAL_BASED_OUTPATIENT_CLINIC_OR_DEPARTMENT_OTHER): Payer: 59 | Admitting: Oncology

## 2016-09-11 ENCOUNTER — Encounter: Payer: Self-pay | Admitting: Oncology

## 2016-09-11 ENCOUNTER — Inpatient Hospital Stay: Payer: 59 | Admitting: *Deleted

## 2016-09-11 ENCOUNTER — Inpatient Hospital Stay: Payer: 59

## 2016-09-11 ENCOUNTER — Telehealth: Payer: Self-pay | Admitting: *Deleted

## 2016-09-11 VITALS — BP 100/70 | Temp 96.3°F | Resp 16 | Wt 126.2 lb

## 2016-09-11 DIAGNOSIS — H532 Diplopia: Secondary | ICD-10-CM

## 2016-09-11 DIAGNOSIS — M797 Fibromyalgia: Secondary | ICD-10-CM

## 2016-09-11 DIAGNOSIS — R000 Tachycardia, unspecified: Secondary | ICD-10-CM

## 2016-09-11 DIAGNOSIS — E876 Hypokalemia: Secondary | ICD-10-CM

## 2016-09-11 DIAGNOSIS — J449 Chronic obstructive pulmonary disease, unspecified: Secondary | ICD-10-CM

## 2016-09-11 DIAGNOSIS — D631 Anemia in chronic kidney disease: Secondary | ICD-10-CM | POA: Diagnosis not present

## 2016-09-11 DIAGNOSIS — Z87820 Personal history of traumatic brain injury: Secondary | ICD-10-CM

## 2016-09-11 DIAGNOSIS — R634 Abnormal weight loss: Secondary | ICD-10-CM

## 2016-09-11 DIAGNOSIS — Z8619 Personal history of other infectious and parasitic diseases: Secondary | ICD-10-CM

## 2016-09-11 DIAGNOSIS — F419 Anxiety disorder, unspecified: Secondary | ICD-10-CM

## 2016-09-11 DIAGNOSIS — C661 Malignant neoplasm of right ureter: Secondary | ICD-10-CM

## 2016-09-11 DIAGNOSIS — E871 Hypo-osmolality and hyponatremia: Secondary | ICD-10-CM

## 2016-09-11 DIAGNOSIS — Z79899 Other long term (current) drug therapy: Secondary | ICD-10-CM

## 2016-09-11 DIAGNOSIS — R440 Auditory hallucinations: Secondary | ICD-10-CM | POA: Diagnosis not present

## 2016-09-11 DIAGNOSIS — Z86010 Personal history of colonic polyps: Secondary | ICD-10-CM

## 2016-09-11 DIAGNOSIS — J45909 Unspecified asthma, uncomplicated: Secondary | ICD-10-CM

## 2016-09-11 DIAGNOSIS — C679 Malignant neoplasm of bladder, unspecified: Secondary | ICD-10-CM

## 2016-09-11 DIAGNOSIS — F329 Major depressive disorder, single episode, unspecified: Secondary | ICD-10-CM

## 2016-09-11 DIAGNOSIS — Z5111 Encounter for antineoplastic chemotherapy: Secondary | ICD-10-CM | POA: Diagnosis not present

## 2016-09-11 DIAGNOSIS — N1330 Unspecified hydronephrosis: Secondary | ICD-10-CM

## 2016-09-11 DIAGNOSIS — D72829 Elevated white blood cell count, unspecified: Secondary | ICD-10-CM | POA: Diagnosis not present

## 2016-09-11 DIAGNOSIS — M129 Arthropathy, unspecified: Secondary | ICD-10-CM

## 2016-09-11 DIAGNOSIS — Z8701 Personal history of pneumonia (recurrent): Secondary | ICD-10-CM

## 2016-09-11 DIAGNOSIS — M5030 Other cervical disc degeneration, unspecified cervical region: Secondary | ICD-10-CM

## 2016-09-11 DIAGNOSIS — K5900 Constipation, unspecified: Secondary | ICD-10-CM

## 2016-09-11 DIAGNOSIS — K219 Gastro-esophageal reflux disease without esophagitis: Secondary | ICD-10-CM

## 2016-09-11 DIAGNOSIS — R1110 Vomiting, unspecified: Secondary | ICD-10-CM

## 2016-09-11 LAB — COMPREHENSIVE METABOLIC PANEL
ALBUMIN: 3.4 g/dL — AB (ref 3.5–5.0)
ALT: 13 U/L — ABNORMAL LOW (ref 14–54)
AST: 23 U/L (ref 15–41)
Alkaline Phosphatase: 55 U/L (ref 38–126)
Anion gap: 10 (ref 5–15)
BILIRUBIN TOTAL: 0.7 mg/dL (ref 0.3–1.2)
BUN: 29 mg/dL — AB (ref 6–20)
CHLORIDE: 101 mmol/L (ref 101–111)
CO2: 19 mmol/L — ABNORMAL LOW (ref 22–32)
CREATININE: 1.3 mg/dL — AB (ref 0.44–1.00)
Calcium: 8.7 mg/dL — ABNORMAL LOW (ref 8.9–10.3)
GFR calc Af Amer: 50 mL/min — ABNORMAL LOW (ref >60)
GFR, EST NON AFRICAN AMERICAN: 43 mL/min — AB (ref >60)
GLUCOSE: 120 mg/dL — AB (ref 65–99)
POTASSIUM: 3.3 mmol/L — AB (ref 3.5–5.1)
Sodium: 130 mmol/L — ABNORMAL LOW (ref 135–145)
TOTAL PROTEIN: 6.7 g/dL (ref 6.5–8.1)

## 2016-09-11 LAB — CBC WITH DIFFERENTIAL/PLATELET
BASOS ABS: 0 10*3/uL (ref 0–0.1)
BASOS PCT: 0 %
Eosinophils Absolute: 0.2 10*3/uL (ref 0–0.7)
Eosinophils Relative: 1 %
HEMATOCRIT: 30.5 % — AB (ref 35.0–47.0)
Hemoglobin: 10.2 g/dL — ABNORMAL LOW (ref 12.0–16.0)
LYMPHS PCT: 18 %
Lymphs Abs: 3.5 10*3/uL (ref 1.0–3.6)
MCH: 27.7 pg (ref 26.0–34.0)
MCHC: 33.5 g/dL (ref 32.0–36.0)
MCV: 82.6 fL (ref 80.0–100.0)
Monocytes Absolute: 0.7 10*3/uL (ref 0.2–0.9)
Monocytes Relative: 4 %
NEUTROS ABS: 14.5 10*3/uL — AB (ref 1.4–6.5)
NEUTROS PCT: 77 %
Platelets: 242 10*3/uL (ref 150–440)
RBC: 3.7 MIL/uL — AB (ref 3.80–5.20)
RDW: 21.6 % — AB (ref 11.5–14.5)
WBC: 18.9 10*3/uL — AB (ref 3.6–11.0)

## 2016-09-11 MED ORDER — SODIUM CHLORIDE 0.9% FLUSH
10.0000 mL | INTRAVENOUS | Status: DC | PRN
  Administered 2016-09-11: 10 mL
  Filled 2016-09-11: qty 10

## 2016-09-11 MED ORDER — HEPARIN SOD (PORK) LOCK FLUSH 100 UNIT/ML IV SOLN
500.0000 [IU] | Freq: Once | INTRAVENOUS | Status: AC | PRN
  Administered 2016-09-11: 500 [IU]
  Filled 2016-09-11: qty 5

## 2016-09-11 MED ORDER — SODIUM CHLORIDE 0.9 % IV SOLN
Freq: Once | INTRAVENOUS | Status: AC
  Administered 2016-09-11: 13:00:00 via INTRAVENOUS
  Filled 2016-09-11: qty 1000

## 2016-09-11 MED ORDER — DEXAMETHASONE SODIUM PHOSPHATE 10 MG/ML IJ SOLN
10.0000 mg | Freq: Once | INTRAMUSCULAR | Status: AC
  Administered 2016-09-11: 10 mg via INTRAVENOUS
  Filled 2016-09-11: qty 1

## 2016-09-11 MED ORDER — ONDANSETRON HCL 4 MG PO TABS
8.0000 mg | ORAL_TABLET | Freq: Once | ORAL | Status: AC
  Administered 2016-09-11: 8 mg via ORAL
  Filled 2016-09-11: qty 2

## 2016-09-11 MED ORDER — GEMCITABINE HCL CHEMO INJECTION 1 GM/26.3ML
1400.0000 mg | Freq: Once | INTRAVENOUS | Status: AC
  Administered 2016-09-11: 1400 mg via INTRAVENOUS
  Filled 2016-09-11: qty 26.3

## 2016-09-11 NOTE — Progress Notes (Signed)
Patient here today for follow up.  Patient has been having sensitivity to sound.

## 2016-09-11 NOTE — Telephone Encounter (Signed)
Called Patty to discuss results of k-3.3.  She is currently taking 1 10 meq potassium  A day.  Per Dr. Janese Banks she should increase to 1 bid for a total of 20 meq.  I asked Patty if pt was suppose to get fluids this week and she said it is dependent upon her ortho static vitals. I told her that dr. Janese Banks wanted her to have fluids this week because of her creat and na level to have it this week. Chong Sicilian will let the infusion people know but they really need the order to say about the labs also. I will send message via fax office closed today to  Dr. Clemmie Krill requesting the change.  She was agreeable.  Also if pt would be ok with checking labs next week and patty wants mebane and it has been made for 1/3 at 11 am.

## 2016-09-11 NOTE — Progress Notes (Signed)
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 Lower endoscopy ultrasound on 06/21/2016 revealed an infiltrative 5.6 x 4.5 cm nonobstructing mass in the rectosigmoid colon. Biopsies revealed urothelial carcinoma. Pathologic stage was T4 NX M0. Prior to that CT abdomen on 05/14/2016 revealed a 5.7 cm right lower quadrant retroperitoneal mass involving the sigmoid colon associated with chronic right ureteral obstruction by the mass with hydronephrosis and renal atrophic. Multiple subcentimeter liver lesions which were indeterminate. PET CT scan on 06/06/2016 revealed hypermetabolic with an SUV of 0000000 in the right lower quadrant retroperitoneal mass and a 4 mm nodule in the left lower lobe of unclear significance. She had a right nephrostomy tube placement on 07/11/2016. She also has anemia of chronic disease. She completed cycle 1 day 1 and day 8 neoadjuvant gemcitabine and split dose cisplatin at 35 mg/m last chemotherapy was on 08/06/2016.  Interval history- patient tolerated her day 1 of chemotherapy relatively well. Did not have any significant nausea or vomiting or fever. Does report significant fatigue and she has been resting for most of the part of the day. Continues to have pelvic pain which is relatively stable. HEENT  she reports increased sensitivity to sounds and intermittent episodes of auditory hallucinations.   ECOG PS- 2-3 Pain scale- 5 Opioid associated constipation- continue pain meds  Review of systems- Review of Systems  Constitutional: Positive for malaise/fatigue. Negative for chills, fever and weight loss.  HENT: Negative for congestion, ear discharge and nosebleeds.   Eyes: Negative for blurred vision.  Respiratory: Negative for cough, hemoptysis, sputum production, shortness of breath and wheezing.   Cardiovascular: Negative for chest pain, palpitations, orthopnea and claudication.  Gastrointestinal: Positive for abdominal pain (pelvic pain+). Negative for blood in stool, constipation, diarrhea, heartburn, melena, nausea and vomiting.  Genitourinary: Negative for dysuria, flank pain, frequency, hematuria and urgency.  Musculoskeletal: Negative for back pain, joint pain and myalgias.  Skin: Negative for rash.  Neurological: Negative for dizziness, tingling, focal weakness, seizures, weakness and headaches.  Endo/Heme/Allergies: Does not bruise/bleed easily.  Psychiatric/Behavioral: Positive for hallucinations (auditory hallucinations). Negative for depression and suicidal ideas. The patient does not have insomnia.      Current treatment- gem/cis  Allergies  Allergen Reactions  . Other Swelling    Dust, ragweed  . Gabapentin Other (See Comments)    Jerking motions and confusion.  . Iodides   . Tetanus Toxoids Other (See Comments)    Localized pain and swelling  . Iodine Other (See Comments) and Rash    Limited history. Occurred in 41s, at age 26-12, received contrast at public health facility in [place], while being evaluated for meningitis. Immediate rash - not itchy, "flat, red, perfect circles" over entire body. Has not received iodine contrast since.     Past Medical History:  Diagnosis Date  . Anxiety   .  Arthritis   . Asthma    mild  . Cancer (HCC)    urothelia    . Cervical disc disease   . COPD (chronic obstructive pulmonary disease) (HCC)    mild  . Depression   . Dysrhythmia    tachycardia  . Fibromyalgia   . GERD (gastroesophageal reflux disease)   . Hepatitis C   . History of hiatal hernia   . Intracerebral hemorrhage [facility])      Past Surgical History:  Procedure Laterality Date  . BREAST REDUCTION SURGERY    . cervical discectomy with fusion    . COLONOSCOPY  01/2016  . EUS N/A 06/21/2016   Procedure: LOWER ENDOSCOPIC ULTRASOUND (EUS);  Surgeon: Reita Cliche, MD;  Location: [facility] ENDOSCOPY;  Service: Endoscopy;  Laterality: N/A;  . IR GENERIC HISTORICAL  07/11/2016   IR NEPHROSTOMY PLACEMENT RIGHT 07/11/2016 Greggory Keen, MD [facility]-INTERV RAD  . IR GENERIC HISTORICAL  08/31/2016   IR NEPHROSTOMY TUBE CHANGE 08/31/2016 [facility]INTER[facility] RAD  . PERIPHERAL VASCULAR CATHETERIZATION N/A 07/23/2016   Procedure: Glori Luis Cath Insertion;  Surgeon: Algernon Huxley, MD;  Location: [facility];  Service: Cardiovascular;  Laterality: N/A;    Social History   Social History  . Marital status: Single    Spouse name: N/A  . Number of children: N/A  . Years of education: N/A   Occupational History  . Not on file.   Social History Main Topics  . Smoking status: Never Smoker  . Smokeless tobacco: Never Used  . Alcohol use No  . Drug use:     Frequency: 14.0 times per week    Types: Marijuana     Comment: smoked this morning  . Sexual activity: Not on file   Other Topics Concern  . Not on file   Social History Narrative  . No narrative on file    No family history on file.   Current Outpatient Prescriptions:  .  baclofen (LIORESAL) 20 MG tablet, Take 20 mg by mouth 4 (four) times daily as needed for muscle spasms. , Disp: , Rfl:  .  dexamethasone (DECADRON) 4 MG tablet, Take 2 tablets by mouth once a day on the day after cisplatin chemotherapy and then take 2 tablets two times a day for 2 days. Take with food., Disp: 30 tablet,  Rfl: 1 .  diltiazem (CARDIZEM CD) 120 MG 24 hr capsule, Take 240 mg by mouth at bedtime. , Disp: , Rfl:  .  diphenhydrAMINE (BENADRYL) 25 MG tablet, Take 25-50 mg by mouth every 6 (six) hours as needed for allergies. , Disp: , Rfl:  .  escitalopram (LEXAPRO) 20 MG tablet, Take 40 mg by mouth at bedtime. , Disp: , Rfl:  .  Iron-Vitamin C (VITRON-C PO), Take 1 tablet by mouth daily., Disp: , Rfl:  .  lactulose (CHRONULAC) 10 GM/15ML solution, Take 10 g by mouth 2 (two) times daily. , Disp: , Rfl:  .  lidocaine-prilocaine (EMLA) cream, Apply cream to port site 1 hour prior to chemotherapy, place small amount of saran wrap to cover cream and protect clothing., Disp: 30 g, Rfl: 1 .  LORazepam (ATIVAN) 0.5 MG tablet, Take 1 tablet (0.5 mg total) by mouth every 6 (six) hours as needed (Nausea or vomiting)., Disp: 30 tablet, Rfl: 0 .  megestrol (MEGACE) 400 MG/10ML suspension, Take 5 mLs (200 mg total) by mouth daily., Disp: 240 mL, Rfl: 0 .  morphine (MS CONTIN) 15 MG 12 hr tablet, Take 15 mg  by mouth 2 (two) times daily., Disp: , Rfl:  .  ondansetron (ZOFRAN) 8 MG tablet, Take 1 tablet (8 mg total) by mouth 2 (two) times daily as needed. Start on the third day after cisplatin chemotherapy., Disp: 30 tablet, Rfl: 1 .  oxyCODONE-acetaminophen (PERCOCET/ROXICET) 5-325 MG tablet, Take 1 tablet by mouth every 4 (four) hours as needed for moderate pain or severe pain. , Disp: , Rfl:  .  polyethylene glycol (MIRALAX / GLYCOLAX) packet, Take 17 g by mouth daily as needed for mild constipation. , Disp: , Rfl:  .  prochlorperazine (COMPAZINE) 5 MG/ML injection, Inject 10 mg into the muscle every 4 (four) hours as needed (nausea). , Disp: , Rfl:  .  promethazine (PHENERGAN) 25 MG suppository, Place 25 mg rectally every 6 (six) hours as needed for nausea or vomiting. , Disp: , Rfl:  .  ranitidine (ZANTAC) 300 MG tablet, Take 300 mg by mouth 2 (two) times daily. , Disp: , Rfl:  .  NEOMYCIN-POLYMYXIN-HYDROCORTISONE  (CORTISPORIN) 1 % SOLN otic solution, INSTILL 2 DROPS TO AFFECTED EAR 4 TIMES DAILY, Disp: , Rfl: 2 .  potassium chloride (K-DUR) 10 MEQ tablet, Take 1 tablet (10 mEq total) by mouth daily., Disp: 30 tablet, Rfl: 1 No current facility-administered medications for this visit.   Facility-Administered Medications Ordered in Other Visits:  .  heparin lock flush 100 unit/mL, 500 Units, Intracatheter, Once PRN, Lequita Asal, MD .  sodium chloride flush (NS) 0.9 % injection 10 mL, 10 mL, Intravenous, PRN, Lequita Asal, MD, 10 mL at 07/26/16 0935 .  sodium chloride flush (NS) 0.9 % injection 10 mL, 10 mL, Intracatheter, PRN, Lequita Asal, MD, 10 mL at 09/11/16 1134  Physical exam:  Vitals:   09/11/16 1157 09/11/16 1208  BP:  100/70  Resp: 16   Temp: (!) 96.3 F (35.7 C)   TempSrc: Tympanic   Weight: 126 lb 3.4 oz (57.2 kg)    Physical Exam  Constitutional: She is oriented to person, place, and time.  Appears older than stated age, frail-appearing. She is lying sideways on the examination table  HENT:  Head: Normocephalic and atraumatic.  Eyes: EOM are normal. Pupils are equal, round, and reactive to light.  Neck: Normal range of motion.  Cardiovascular: Normal rate, regular rhythm and normal heart sounds.   Pulmonary/Chest: Effort normal and breath sounds normal.  Abdominal: Soft. Bowel sounds are normal.  Neurological: She is alert and oriented to person, place, and time.  Skin: Skin is warm and dry.     CMP Latest Ref Rng & Units 09/11/2016  Glucose 65 - 99 mg/dL 120(H)  BUN 6 - 20 mg/dL 29(H)  Creatinine 0.44 - 1.00 mg/dL 1.30(H)  Sodium 135 - 145 mmol/L 130(L)  Potassium 3.5 - 5.1 mmol/L 3.3(L)  Chloride 101 - 111 mmol/L 101  CO2 22 - 32 mmol/L 19(L)  Calcium 8.9 - 10.3 mg/dL 8.7(L)  Total Protein 6.5 - 8.1 g/dL 6.7  Total Bilirubin 0.3 - 1.2 mg/dL 0.7  Alkaline Phos 38 - 126 U/L 55  AST 15 - 41 U/L 23  ALT 14 - 54 U/L 13(L)   CBC Latest Ref Rng & Units  09/11/2016  WBC 3.6 - 11.0 K/uL 18.9(H)  Hemoglobin 12.0 - 16.0 g/dL 10.2(L)  Hematocrit 35.0 - 47.0 % 30.5(L)  Platelets 150 - 440 K/uL 242    No images are attached to the encounter.  Ct Head Wo Contrast  Result Date: 08/29/2016 CLINICAL DATA:  Three-week  history of headaches.  Fell 3 weeks ago. EXAM: CT HEAD WITHOUT CONTRAST TECHNIQUE: Contiguous axial images were obtained from the base of the skull through the vertex without intravenous contrast. COMPARISON:  08/08/2016 FINDINGS: Brain: The ventricles are normal in size and configuration. No extra-axial fluid collections are identified. The gray-white differentiation is normal. No CT findings for acute intracranial process such as hemorrhage or infarction. No mass lesions. The brainstem and cerebellum are grossly normal. Vascular: No hyperdense vessel or unexpected calcification. Skull: Normal. Negative for fracture or focal lesion. Sinuses/Orbits: The paranasal sinuses and mastoid air cells are clear. The globes are intact. Other: No scalp lesion or hematoma. IMPRESSION: No acute intracranial findings. No persistent or recurrent intracranial hemorrhage. Electronically Signed   By: Marijo Sanes M.D.   On: 08/29/2016 16:07   Ir Nephrostomy Tube Change  Result Date: 08/31/2016 INDICATION: 65 year old with urothelial carcinoma and history of right hydronephrosis. Patient presents for routine nephrostomy tube exchange. No problems with the nephrostomy tube. EXAM: EXCHANGE OF RIGHT NEPHROSTOMY TUBE WITH FLUOROSCOPY MEDICATIONS: None ANESTHESIA/SEDATION: None CONTRAST:  80mL ISOVUE-300 IOPAMIDOL (ISOVUE-300) INJECTION 61% - administered into the collecting system(s) FLUOROSCOPY TIME:  Fluoroscopy Time: 18 seconds, 1 mGy COMPLICATIONS: None immediate. PROCEDURE: The procedure was explained to the patient. The risks and benefits of the procedure were discussed and the patient's questions were addressed. Informed consent was obtained from the patient.  Patient was placed prone. The right flank was prepped and draped in a sterile fashion. Maximal barrier sterile technique was utilized including caps, mask, sterile gowns, sterile gloves, sterile drape, hand hygiene and skin antiseptic. Small amount of dilute contrast was injected through the right nephrostomy tube. Catheter was cut and removed over a Bentson wire. A new 10.2 [place] multipurpose drain was reconstituted in the right renal pelvis. Small amount of dilute contrast was injected to confirm placement in the collecting system. Skin was anesthetized with 1% lidocaine. Catheter sutured to skin. Fluoroscopic images were taken and saved for this procedure. FINDINGS: Nephrostomy tube is positioned in the right renal pelvis. IMPRESSION: Successful exchange of the right percutaneous nephrostomy tube with fluoroscopy. Patient has an iodine allergy but had no complications with this nephrostomy tube exchange using a small quantity of dilute contrast. Patient was not pre-medicated for this procedure. Electronically Signed   By: Markus Daft M.D.   On: 08/31/2016 12:29     Assessment and plan- Patient is a 65 y.o. female locally advanced transitional cell carcinoma of the bladder status post 2 cycles of chemotherapy with gemcitabine and cisplatin who is here for assessment prior to day 8 cycle #2 of chemotherapy  1. Counts are okay to proceed with chemotherapy #2 day 8 of gemcitabine at 800 mg/m. I again discussed with the patient and her significant other that patient is getting more and more fatigued and spending more time in her bed. At this time options would be to proceed with chemotherapy but have a low threshold to stop at if she were to have any worsening side effects are declining quality of life versus consideration for palliative radiation to help her with pelvic pain and take a break from chemotherapy or consider supportive care options such as hospice. At this point patient would like to continue with  chemotherapy and we will proceed with day 8 cycle #2 of chemotherapy as planned. She will be seen in 2 weeks' time by Dr. Mike Gip with CBC CMP and possible chemotherapy and to have further discussion regarding goals of care. Her significant other  is very involved in her care and she will let us know if patient needs IV fluids which are already set up at home.  2. Auditory hallucinations-these have been chronic since starting chemotherapy. These are mild and self-limited at this time and we will continue to monitor    Total face to face encounter time for this patient visit was 30 min. >50% of the time was  spent in counseling and coordination of care.     Visit Diagnosis 1. Ureter cancer, right [facility])      Dr. Randa Evens, MD, MPH [facility] Pager- FB:[phone] 09/11/2016 12:29 PM

## 2016-09-15 ENCOUNTER — Inpatient Hospital Stay
Admission: EM | Admit: 2016-09-15 | Discharge: 2016-09-20 | DRG: 252 | Disposition: A | Discharge status: Home / Self Care | Payer: 59 | Attending: Internal Medicine | Admitting: Internal Medicine

## 2016-09-15 ENCOUNTER — Encounter: Payer: Self-pay | Admitting: Internal Medicine

## 2016-09-15 ENCOUNTER — Emergency Department: Payer: 59

## 2016-09-15 DIAGNOSIS — I824Z2 Acute embolism and thrombosis of unspecified deep veins of left distal lower extremity: Principal | ICD-10-CM | POA: Diagnosis present

## 2016-09-15 DIAGNOSIS — Z887 Allergy status to serum and vaccine status: Secondary | ICD-10-CM

## 2016-09-15 DIAGNOSIS — F329 Major depressive disorder, single episode, unspecified: Secondary | ICD-10-CM | POA: Diagnosis present

## 2016-09-15 DIAGNOSIS — R710 Precipitous drop in hematocrit: Secondary | ICD-10-CM | POA: Diagnosis present

## 2016-09-15 DIAGNOSIS — R609 Edema, unspecified: Secondary | ICD-10-CM

## 2016-09-15 DIAGNOSIS — Z8550 Personal history of malignant neoplasm of unspecified urinary tract organ: Secondary | ICD-10-CM

## 2016-09-15 DIAGNOSIS — T451X5A Adverse effect of antineoplastic and immunosuppressive drugs, initial encounter: Secondary | ICD-10-CM | POA: Diagnosis present

## 2016-09-15 DIAGNOSIS — B1920 Unspecified viral hepatitis C without hepatic coma: Secondary | ICD-10-CM | POA: Diagnosis present

## 2016-09-15 DIAGNOSIS — D6959 Other secondary thrombocytopenia: Secondary | ICD-10-CM | POA: Diagnosis present

## 2016-09-15 DIAGNOSIS — N1330 Unspecified hydronephrosis: Secondary | ICD-10-CM

## 2016-09-15 DIAGNOSIS — F419 Anxiety disorder, unspecified: Secondary | ICD-10-CM | POA: Diagnosis present

## 2016-09-15 DIAGNOSIS — M8440XA Pathological fracture, unspecified site, initial encounter for fracture: Secondary | ICD-10-CM | POA: Diagnosis present

## 2016-09-15 DIAGNOSIS — Z79891 Long term (current) use of opiate analgesic: Secondary | ICD-10-CM

## 2016-09-15 DIAGNOSIS — D61810 Antineoplastic chemotherapy induced pancytopenia: Secondary | ICD-10-CM | POA: Diagnosis present

## 2016-09-15 DIAGNOSIS — C641 Malignant neoplasm of right kidney, except renal pelvis: Secondary | ICD-10-CM | POA: Diagnosis present

## 2016-09-15 DIAGNOSIS — C661 Malignant neoplasm of right ureter: Secondary | ICD-10-CM | POA: Diagnosis present

## 2016-09-15 DIAGNOSIS — K219 Gastro-esophageal reflux disease without esophagitis: Secondary | ICD-10-CM | POA: Diagnosis present

## 2016-09-15 DIAGNOSIS — M1990 Unspecified osteoarthritis, unspecified site: Secondary | ICD-10-CM | POA: Diagnosis present

## 2016-09-15 DIAGNOSIS — Z936 Other artificial openings of urinary tract status: Secondary | ICD-10-CM

## 2016-09-15 DIAGNOSIS — C669 Malignant neoplasm of unspecified ureter: Secondary | ICD-10-CM

## 2016-09-15 DIAGNOSIS — D638 Anemia in other chronic diseases classified elsewhere: Secondary | ICD-10-CM | POA: Diagnosis present

## 2016-09-15 DIAGNOSIS — M797 Fibromyalgia: Secondary | ICD-10-CM | POA: Diagnosis present

## 2016-09-15 DIAGNOSIS — Z7401 Bed confinement status: Secondary | ICD-10-CM

## 2016-09-15 DIAGNOSIS — I4891 Unspecified atrial fibrillation: Secondary | ICD-10-CM | POA: Diagnosis present

## 2016-09-15 DIAGNOSIS — Z888 Allergy status to other drugs, medicaments and biological substances status: Secondary | ICD-10-CM

## 2016-09-15 DIAGNOSIS — C787 Secondary malignant neoplasm of liver and intrahepatic bile duct: Secondary | ICD-10-CM | POA: Diagnosis present

## 2016-09-15 DIAGNOSIS — E876 Hypokalemia: Secondary | ICD-10-CM | POA: Diagnosis present

## 2016-09-15 DIAGNOSIS — G894 Chronic pain syndrome: Secondary | ICD-10-CM | POA: Diagnosis present

## 2016-09-15 DIAGNOSIS — D649 Anemia, unspecified: Secondary | ICD-10-CM | POA: Diagnosis present

## 2016-09-15 DIAGNOSIS — B182 Chronic viral hepatitis C: Secondary | ICD-10-CM | POA: Diagnosis present

## 2016-09-15 DIAGNOSIS — Z66 Do not resuscitate: Secondary | ICD-10-CM | POA: Diagnosis present

## 2016-09-15 DIAGNOSIS — I82402 Acute embolism and thrombosis of unspecified deep veins of left lower extremity: Secondary | ICD-10-CM | POA: Diagnosis present

## 2016-09-15 DIAGNOSIS — J449 Chronic obstructive pulmonary disease, unspecified: Secondary | ICD-10-CM | POA: Diagnosis present

## 2016-09-15 LAB — BASIC METABOLIC PANEL
Anion gap: 8 (ref 5–15)
BUN: 22 mg/dL — AB (ref 6–20)
CALCIUM: 8.4 mg/dL — AB (ref 8.9–10.3)
CO2: 21 mmol/L — ABNORMAL LOW (ref 22–32)
CREATININE: 1.15 mg/dL — AB (ref 0.44–1.00)
Chloride: 105 mmol/L (ref 101–111)
GFR calc Af Amer: 57 mL/min — ABNORMAL LOW (ref >60)
GFR, EST NON AFRICAN AMERICAN: 50 mL/min — AB (ref >60)
GLUCOSE: 128 mg/dL — AB (ref 65–99)
POTASSIUM: 4.3 mmol/L (ref 3.5–5.1)
SODIUM: 134 mmol/L — AB (ref 135–145)

## 2016-09-15 LAB — CBC
HCT: 22.7 % — ABNORMAL LOW (ref 35.0–47.0)
Hemoglobin: 7.6 g/dL — ABNORMAL LOW (ref 12.0–16.0)
MCH: 28 pg (ref 26.0–34.0)
MCHC: 33.3 g/dL (ref 32.0–36.0)
MCV: 83.9 fL (ref 80.0–100.0)
PLATELETS: 95 10*3/uL — AB (ref 150–440)
RBC: 2.7 MIL/uL — AB (ref 3.80–5.20)
RDW: 21.8 % — ABNORMAL HIGH (ref 11.5–14.5)
WBC: 16 10*3/uL — ABNORMAL HIGH (ref 3.6–11.0)

## 2016-09-15 LAB — APTT: aPTT: 24 seconds (ref 24–36)

## 2016-09-15 LAB — PROTIME-INR
INR: 0.99
PROTHROMBIN TIME: 13.1 s (ref 11.4–15.2)

## 2016-09-15 MED ORDER — PENTAFLUOROPROP-TETRAFLUOROETH EX AERO
INHALATION_SPRAY | CUTANEOUS | Status: AC
  Filled 2016-09-15: qty 30

## 2016-09-15 MED ORDER — ALBUTEROL SULFATE (2.5 MG/3ML) 0.083% IN NEBU: 2.5000 mg | INHALATION_SOLUTION | RESPIRATORY_TRACT | Status: DC | PRN

## 2016-09-15 MED ORDER — BACLOFEN 10 MG PO TABS: 20.0000 mg | ORAL_TABLET | Freq: Four times a day (QID) | ORAL | Status: DC | PRN

## 2016-09-15 MED ORDER — SODIUM CHLORIDE 0.9% FLUSH
3.0000 mL | Freq: Two times a day (BID) | INTRAVENOUS | Status: DC
  Administered 2016-09-16 – 2016-09-20 (×6): 3 mL via INTRAVENOUS

## 2016-09-15 MED ORDER — DILTIAZEM HCL ER COATED BEADS 240 MG PO CP24
240.0000 mg | ORAL_CAPSULE | Freq: Every day | ORAL | Status: DC
  Administered 2016-09-15 – 2016-09-19 (×5): 240 mg via ORAL
  Filled 2016-09-15 (×5): qty 1

## 2016-09-15 MED ORDER — LORAZEPAM 0.5 MG PO TABS: 0.5000 mg | ORAL_TABLET | Freq: Four times a day (QID) | ORAL | Status: DC | PRN

## 2016-09-15 MED ORDER — ACETAMINOPHEN 650 MG RE SUPP: 650.0000 mg | Freq: Four times a day (QID) | RECTAL | Status: DC | PRN

## 2016-09-15 MED ORDER — SODIUM CHLORIDE 0.9% FLUSH: 3.0000 mL | INTRAVENOUS | Status: DC | PRN

## 2016-09-15 MED ORDER — HEPARIN (PORCINE) IN NACL 100-0.45 UNIT/ML-% IJ SOLN
1250.0000 [IU]/h | INTRAMUSCULAR | Status: DC
  Administered 2016-09-15: 900 [IU]/h via INTRAVENOUS
  Administered 2016-09-17 – 2016-09-19 (×3): 1250 [IU]/h via INTRAVENOUS
  Filled 2016-09-15 (×9): qty 250

## 2016-09-15 MED ORDER — HEPARIN BOLUS VIA INFUSION
2800.0000 [IU] | Freq: Once | INTRAVENOUS | Status: AC
  Administered 2016-09-15: 2800 [IU] via INTRAVENOUS
  Filled 2016-09-15: qty 2800

## 2016-09-15 MED ORDER — ONDANSETRON HCL 4 MG PO TABS: 4.0000 mg | ORAL_TABLET | Freq: Four times a day (QID) | ORAL | Status: DC | PRN

## 2016-09-15 MED ORDER — LACTULOSE 10 GM/15ML PO SOLN
10.0000 g | Freq: Two times a day (BID) | ORAL | Status: DC
  Administered 2016-09-15 – 2016-09-19 (×9): 10 g via ORAL
  Filled 2016-09-15 (×9): qty 30

## 2016-09-15 MED ORDER — DOCUSATE SODIUM 100 MG PO CAPS
100.0000 mg | ORAL_CAPSULE | Freq: Two times a day (BID) | ORAL | Status: DC
  Administered 2016-09-15 – 2016-09-20 (×9): 100 mg via ORAL
  Filled 2016-09-15 (×9): qty 1

## 2016-09-15 MED ORDER — POTASSIUM CHLORIDE CRYS ER 20 MEQ PO TBCR
20.0000 meq | EXTENDED_RELEASE_TABLET | Freq: Every day | ORAL | Status: DC
  Administered 2016-09-16 – 2016-09-20 (×5): 20 meq via ORAL
  Filled 2016-09-15: qty 2
  Filled 2016-09-15 (×5): qty 1

## 2016-09-15 MED ORDER — MEGESTROL ACETATE 400 MG/10ML PO SUSP
200.0000 mg | Freq: Every day | ORAL | Status: DC
  Administered 2016-09-16 – 2016-09-20 (×5): 200 mg via ORAL
  Filled 2016-09-15 (×5): qty 10

## 2016-09-15 MED ORDER — ACETAMINOPHEN 325 MG PO TABS
650.0000 mg | ORAL_TABLET | Freq: Four times a day (QID) | ORAL | Status: DC | PRN
Start: 2016-09-15 — End: 2016-09-20

## 2016-09-15 MED ORDER — MORPHINE SULFATE ER 15 MG PO TBCR
15.0000 mg | EXTENDED_RELEASE_TABLET | Freq: Two times a day (BID) | ORAL | Status: DC
  Administered 2016-09-15 – 2016-09-18 (×6): 15 mg via ORAL
  Filled 2016-09-15 (×6): qty 1

## 2016-09-15 MED ORDER — SODIUM CHLORIDE 0.9% FLUSH
3.0000 mL | Freq: Two times a day (BID) | INTRAVENOUS | Status: DC
  Administered 2016-09-17 – 2016-09-18 (×2): 3 mL via INTRAVENOUS
  Administered 2016-09-18: 10 mL via INTRAVENOUS
  Administered 2016-09-19 – 2016-09-20 (×3): 3 mL via INTRAVENOUS

## 2016-09-15 MED ORDER — FAMOTIDINE 20 MG PO TABS
20.0000 mg | ORAL_TABLET | Freq: Two times a day (BID) | ORAL | Status: DC
  Administered 2016-09-15 – 2016-09-20 (×9): 20 mg via ORAL
  Filled 2016-09-15 (×9): qty 1

## 2016-09-15 MED ORDER — OXYCODONE-ACETAMINOPHEN 5-325 MG PO TABS
1.0000 | ORAL_TABLET | ORAL | Status: DC | PRN
  Administered 2016-09-16 – 2016-09-17 (×5): 1 via ORAL
  Filled 2016-09-15 (×5): qty 1

## 2016-09-15 MED ORDER — ESCITALOPRAM OXALATE 10 MG PO TABS
40.0000 mg | ORAL_TABLET | Freq: Every day | ORAL | Status: DC
  Administered 2016-09-15 – 2016-09-19 (×5): 40 mg via ORAL
  Filled 2016-09-15 (×5): qty 4

## 2016-09-15 MED ORDER — POLYETHYLENE GLYCOL 3350 17 G PO PACK
17.0000 g | PACK | Freq: Every day | ORAL | Status: DC | PRN
  Administered 2016-09-17 – 2016-09-19 (×3): 17 g via ORAL
  Filled 2016-09-15 (×3): qty 1

## 2016-09-15 MED ORDER — SODIUM CHLORIDE 0.9 % IV SOLN: 250.0000 mL | INTRAVENOUS | Status: DC | PRN

## 2016-09-15 MED ORDER — BISACODYL 5 MG PO TBEC
5.0000 mg | DELAYED_RELEASE_TABLET | Freq: Every day | ORAL | Status: DC | PRN
  Administered 2016-09-16: 5 mg via ORAL
  Filled 2016-09-15: qty 1

## 2016-09-15 MED ORDER — ONDANSETRON HCL 4 MG/2ML IJ SOLN: 4.0000 mg | Freq: Four times a day (QID) | INTRAMUSCULAR | Status: DC | PRN

## 2016-09-15 NOTE — H&P (Signed)
Sandra Landry NAME: Sandra Landry    MR#:  [id number]  DATE OF BIRTH:  [birth year]  DATE OF ADMISSION:  09/15/2016  PRIMARY CARE PHYSICIAN: Lynnell Jude, MD   REQUESTING/REFERRING PHYSICIAN: Dr. Marcelene Butte  CHIEF COMPLAINT:   Chief Complaint  Patient presents with  . Leg Swelling    HISTORY OF PRESENT ILLNESS:  Sandra Landry  is a 65 y.o. female with a known history of Urothelial cancer stage IV, COPD, asthma on chemotherapy for his to the emergency room complaining of 2 days of left leg swelling. No shortness of breath or chest pain. Lower extremity Dopplers showed extensive left lower extremity DVT extending from common femoral vein down to the leg veins. No prior history of DVT. She does have pancytopenia with drop in her hemoglobin and platelets. And is being admitted on heparin drip for possible IVC filter and monitoring of her platelets and hemoglobin. Blood in stool, melena or bloody urine.  PAST MEDICAL HISTORY:   Past Medical History:  Diagnosis Date  . Anxiety   . Arthritis   . Asthma    mild  . Cancer (HCC)    urothelia  . Cervical disc disease   . COPD (chronic obstructive pulmonary disease) (HCC)    mild  . Depression   . Dysrhythmia    tachycardia  . Fibromyalgia   . GERD (gastroesophageal reflux disease)   . Hepatitis C   . History of hiatal hernia   . Intracerebral hemorrhage ([place])     PAST SURGICAL HISTORY:   Past Surgical History:  Procedure Laterality Date  . BREAST REDUCTION SURGERY    . cervical discectomy with fusion    . COLONOSCOPY  01/2016  . EUS N/A 06/21/2016   Procedure: LOWER ENDOSCOPIC ULTRASOUND (EUS);  Surgeon: Reita Cliche, MD;  Location: [facility] ENDOSCOPY;  Service: Endoscopy;  Laterality: N/A;  . IR GENERIC HISTORICAL  07/11/2016   IR NEPHROSTOMY PLACEMENT RIGHT 07/11/2016 Greggory Keen, MD [facility]-INTERV RAD  . IR GENERIC HISTORICAL  08/31/2016   IR NEPHROSTOMY TUBE CHANGE  08/31/2016 [facility]INTER[facility]  . PERIPHERAL VASCULAR CATHETERIZATION N/A 07/23/2016   Procedure: Glori Luis Cath Insertion;  Surgeon: Algernon Huxley, MD;  Location: [place] [facility];  Service: Cardiovascular;  Laterality: N/A;   SOCIAL HISTORY:   Social History  Substance Use Topics  . Smoking status: Never Smoker  . Smokeless tobacco: Never Used  . Alcohol use No   FAMILY HISTORY:   Family History  Problem Relation Age of Onset  . Cancer Neg Hx    DRUG ALLERGIES:   Allergies  Allergen Reactions  . Other Swelling    Dust, ragweed  . Gabapentin Other (See Comments)    Jerking motions and confusion.  . Iodides   . Tetanus Toxoids Other (See Comments)    Localized pain and swelling  . Iodine Other (See Comments) and Rash    Limited history. Occurred in 66s, at age 64-12, received contrast at public health facility in [place], while being evaluated for meningitis. Immediate rash - not itchy, "flat, red, perfect circles" over entire body. Has not received iodine contrast since.   REVIEW OF SYSTEMS:   Review of Systems  Constitutional: Positive for malaise/fatigue and weight loss. Negative for chills and fever.  HENT: Negative for hearing loss and nosebleeds.   Eyes: Negative for blurred vision, double vision and pain.  Respiratory: Negative for cough, hemoptysis, sputum production, shortness of breath and wheezing.  Cardiovascular: Positive for leg swelling. Negative for chest pain, palpitations and orthopnea.  Gastrointestinal: Negative for abdominal pain, constipation, diarrhea, nausea and vomiting.  Genitourinary: Negative for dysuria and hematuria.  Musculoskeletal: Negative for back pain, falls and myalgias.  Skin: Negative for rash.  Neurological: Positive for weakness. Negative for dizziness, tremors, sensory change, speech change, focal weakness, seizures and headaches.  Endo/Heme/Allergies: Does not bruise/bleed easily.  Psychiatric/Behavioral: Negative for depression  and memory loss. The patient is not nervous/anxious.    MEDICATIONS AT HOME:   Prior to Admission medications   Medication Sig Start Date End Date Taking? Authorizing Provider  baclofen (LIORESAL) 20 MG tablet Take 20 mg by mouth 4 (four) times daily as needed for muscle spasms.  04/09/16   Historical Provider, MD  dexamethasone (DECADRON) 4 MG tablet Take 2 tablets by mouth once a day on the day after cisplatin chemotherapy and then take 2 tablets two times a day for 2 days. Take with food. 07/30/16   Lequita Asal, MD  diltiazem (CARDIZEM CD) 120 MG 24 hr capsule Take 240 mg by mouth at bedtime.     Historical Provider, MD  diphenhydrAMINE (BENADRYL) 25 MG tablet Take 25-50 mg by mouth every 6 (six) hours as needed for allergies.     Historical Provider, MD  escitalopram (LEXAPRO) 20 MG tablet Take 40 mg by mouth at bedtime.  04/26/16   Historical Provider, MD  Iron-Vitamin C (VITRON-C PO) Take 1 tablet by mouth daily.    Historical Provider, MD  lactulose (CHRONULAC) 10 GM/15ML solution Take 10 g by mouth 2 (two) times daily.  06/07/16   Historical Provider, MD  lidocaine-prilocaine (EMLA) cream Apply cream to port site 1 hour prior to chemotherapy, place small amount of saran wrap to cover cream and protect clothing. 07/24/16   Lequita Asal, MD  LORazepam (ATIVAN) 0.5 MG tablet Take 1 tablet (0.5 mg total) by mouth every 6 (six) hours as needed (Nausea or vomiting). 07/30/16   Lequita Asal, MD  megestrol (MEGACE) 400 MG/10ML suspension Take 5 mLs (200 mg total) by mouth daily. 07/26/16   Lequita Asal, MD  morphine (MS CONTIN) 15 MG 12 hr tablet Take 15 mg by mouth 2 (two) times daily. 07/09/16   Historical Provider, MD  NEOMYCIN-POLYMYXIN-HYDROCORTISONE (CORTISPORIN) 1 % SOLN otic solution INSTILL 2 DROPS TO AFFECTED EAR 4 TIMES DAILY 08/23/16   Historical Provider, MD  ondansetron (ZOFRAN) 8 MG tablet Take 1 tablet (8 mg total) by mouth 2 (two) times daily as needed. Start on  the third day after cisplatin chemotherapy. 07/30/16   Lequita Asal, MD  oxyCODONE-acetaminophen (PERCOCET/ROXICET) 5-325 MG tablet Take 1 tablet by mouth every 4 (four) hours as needed for moderate pain or severe pain.  06/05/16   Historical Provider, MD  polyethylene glycol (MIRALAX / GLYCOLAX) packet Take 17 g by mouth daily as needed for mild constipation.  05/07/16   Historical Provider, MD  potassium chloride (K-DUR) 10 MEQ tablet Take 1 tablet (10 mEq total) by mouth daily. 07/24/16 08/31/16  Lequita Asal, MD  prochlorperazine (COMPAZINE) 5 MG/ML injection Inject 10 mg into the muscle every 4 (four) hours as needed (nausea).     Historical Provider, MD  promethazine (PHENERGAN) 25 MG suppository Place 25 mg rectally every 6 (six) hours as needed for nausea or vomiting.  02/20/16   Historical Provider, MD  ranitidine (ZANTAC) 300 MG tablet Take 300 mg by mouth 2 (two) times daily.  03/08/15  Historical Provider, MD     VITAL SIGNS:  Blood pressure 116/72, pulse (!) 106, temperature 98.7 F (37.1 C), temperature source Oral, resp. rate 18, SpO2 100 %.  PHYSICAL EXAMINATION:  Physical Exam  GENERAL:  65 y.o.-year-old patient lying in the bed with no acute distress.  EYES: Pupils equal, round, reactive to light and accommodation. No scleral icterus. Extraocular muscles intact.  HEENT: Head atraumatic, normocephalic. Oropharynx and nasopharynx clear. No oropharyngeal erythema, moist oral mucosa  NECK:  Supple, no jugular venous distention. No thyroid enlargement, no tenderness.  LUNGS: Normal breath sounds bilaterally, no wheezing, rales, rhonchi. No use of accessory muscles of respiration.  CARDIOVASCULAR: S1, S2 normal. No murmurs, rubs, or gallops.  ABDOMEN: Soft, nontender, nondistended. Bowel sounds present. No organomegaly or mass.  EXTREMITIES: Left lower extremity swelling and edema NEUROLOGIC: Cranial nerves II through XII are intact. No focal Motor or sensory deficits  appreciated b/l PSYCHIATRIC: The patient is alert and oriented x 3. Good affect.  SKIN: No obvious rash, lesion, or ulcer.   LABORATORY PANEL:   CBC  Recent Labs Lab 09/15/16 1516  WBC 16.0*  HGB 7.6*  HCT 22.7*  PLT 95*   ------------------------------------------------------------------------------------------------------------------  Chemistries   Recent Labs Lab 09/11/16 1100 09/15/16 1516  NA 130* 134*  K 3.3* 4.3  CL 101 105  CO2 19* 21*  GLUCOSE 120* 128*  BUN 29* 22*  CREATININE 1.30* 1.15*  CALCIUM 8.7* 8.4*  AST 23  --   ALT 13*  --   ALKPHOS 55  --   BILITOT 0.7  --    ------------------------------------------------------------------------------------------------------------------  Cardiac Enzymes No results for input(s): TROPONINI in the last 168 hours. ------------------------------------------------------------------------------------------------------------------  RADIOLOGY:  US Venous Img Lower Unilateral Left  Result Date: 09/15/2016 CLINICAL DATA:  Left lower extremity pain and edema. History of urothelial carcinoma. EXAM: LEFT LOWER EXTREMITY VENOUS DOPPLER ULTRASOUND TECHNIQUE: Gray-scale sonography with graded compression, as well as color Doppler and duplex ultrasound were performed to evaluate the lower extremity deep venous systems from the level of the common femoral vein and including the common femoral, femoral, profunda femoral, popliteal and calf veins including the posterior tibial, peroneal and gastrocnemius veins when visible. The superficial great saphenous vein was also interrogated. Spectral Doppler was utilized to evaluate flow at rest and with distal augmentation maneuvers in the common femoral, femoral and popliteal veins. COMPARISON:  None. FINDINGS: Contralateral Common Femoral Vein: Respiratory phasicity is normal and symmetric with the symptomatic side. No evidence of thrombus. Normal compressibility. Common Femoral Vein:  Occlusive thrombus identified. Saphenofemoral Junction: Occlusive thrombus identified. Profunda Femoral Vein: Occlusive thrombus identified. Femoral Vein: Occlusive thrombus identified. Popliteal Vein: Occlusive thrombus identified. Calf Veins: Occlusive thrombus identified. Superficial Great Saphenous Vein: Not fully assessed. Venous Reflux:  None. Other Findings:  No abnormal fluid collections. IMPRESSION: Extensive left lower extremity DVT involving the entire visualized deep veins from the level of the common femoral vein to the calf veins. Electronically Signed   By: Aletta Edouard M.D.   On: 09/15/2016 16:14   IMPRESSION AND PLAN:   * Extensive Left lower extremity DVT with active carcinoma Will start heparin drip. Discussed with Dr. Arita Miss of vascular surgery. History Dr. Trenton Gammon of neurosurgery regarding heparin drip with recent history of subarachnoid hemorrhage. Advised patient can be anticoagulated. Need to monitor her Hb and platelets with her pancytopenia, recent worsening. High risk for PE.NO SOB/CP at this time. Consult vascular and Oncology.  * Urothelial cancer stage 4 On active chemotherapy.  *  Pancytopenia due to chemotherapy and cancer We'll monitor. Oncology consult.  * COPD stable. Nebs as needed.  All the records are reviewed and case discussed with ED provider. Management plans discussed with the patient, family and they are in agreement.  CODE STATUS: DNR  TOTAL TIME TAKING CARE OF THIS PATIENT: 40 minutes.   Hillary Bow R M.D on 09/15/2016 at 6:08 PM  Between 7am to 6pm - Pager - [phone]  After 6pm go to [email or web address] - password [email or web address] [facility]  Office  [phone]  CC: Primary care physician; Lynnell Jude, MD  Note: This dictation was prepared with Dragon dictation along with smaller phrase technology. Any transcriptional errors that result from this process are unintentional.
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
## 2016-09-15 NOTE — Plan of Care (Signed)
Problem: Education: Goal: Knowledge of [facility] Education information/materials will improve Outcome: Progressing Pt likes to be called Sandra Landry  Past Medical History:  Diagnosis Date  . Anxiety   . Arthritis   . Asthma    mild  . Cancer (HCC)    urothelia  . Cervical disc disease   . COPD (chronic obstructive pulmonary disease) (HCC)    mild  . Depression   . Dysrhythmia    tachycardia  . Fibromyalgia   . GERD (gastroesophageal reflux disease)   . Hepatitis C   . History of hiatal hernia   . Intracerebral hemorrhage ([place]    Pt is well controlled with home medications

## 2016-09-15 NOTE — ED Triage Notes (Signed)
Pt states that due to her chemo she is in bed a lot, pt states that she noticed she was having a lot of swelling to the left leg. Pt states that she has been having hip pain as well

## 2016-09-15 NOTE — Progress Notes (Signed)
ANTICOAGULATION CONSULT NOTE - Initial Consult  Pharmacy Consult for heparin drip  Indication: DVT  Allergies  Allergen Reactions  . Other Swelling    Dust, ragweed  . Gabapentin Other (See Comments)    Jerking motions and confusion.  . Iodides   . Tetanus Toxoids Other (See Comments)    Localized pain and swelling  . Iodine Other (See Comments) and Rash    Limited history. Occurred in 40s, at age 63-12, received contrast at public health facility in [place], while being evaluated for meningitis. Immediate rash - not itchy, "flat, red, perfect circles" over entire body. Has not received iodine contrast since.     Vital Signs: Temp: 98.7 F (37.1 C) (12/30 1511) Temp Source: Oral (12/30 1511) BP: 116/72 (12/30 1511) Pulse Rate: 106 (12/30 1511)  Labs:  Recent Labs  09/15/16 1516  HGB 7.6*  HCT 22.7*  PLT 95*  CREATININE 1.15*    Estimated Creatinine Clearance: 45.3 mL/min (by C-G formula based on SCr of 1.15 mg/dL (H)).   Medical History: Past Medical History:  Diagnosis Date  . Anxiety   . Arthritis   . Asthma    mild  . Cancer (HCC)    urothelia  . Cervical disc disease   . COPD (chronic obstructive pulmonary disease) (HCC)    mild  . Depression   . Dysrhythmia    tachycardia  . Fibromyalgia   . GERD (gastroesophageal reflux disease)   . Hepatitis C   . History of hiatal hernia   . Intracerebral hemorrhage [facility])    Assessment: 65 yo female with left leg DVT. Pharmacy consulted for heparin dosing and monitoring.  12/30 1516: Plts:  95   Hgb: 7.6  Goal of Therapy:  Heparin level 0.3-0.7 units/ml Monitor platelets by anticoagulation protocol: Yes   Plan:  Baseline labs ordered Give 2800 units bolus x 1 Start heparin infusion at 900 units/hr Check anti-Xa level in 6 hours and daily while on heparin Continue to monitor H&H and platelets.   Pernell Dupre, PharmD, BCPS Clinical Pharmacist 09/15/2016 6:13 PM

## 2016-09-16 DIAGNOSIS — Z66 Do not resuscitate: Secondary | ICD-10-CM | POA: Diagnosis present

## 2016-09-16 DIAGNOSIS — M8440XA Pathological fracture, unspecified site, initial encounter for fracture: Secondary | ICD-10-CM | POA: Diagnosis present

## 2016-09-16 DIAGNOSIS — I82492 Acute embolism and thrombosis of other specified deep vein of left lower extremity: Secondary | ICD-10-CM | POA: Diagnosis not present

## 2016-09-16 DIAGNOSIS — Z936 Other artificial openings of urinary tract status: Secondary | ICD-10-CM | POA: Diagnosis not present

## 2016-09-16 DIAGNOSIS — I824Z2 Acute embolism and thrombosis of unspecified deep veins of left distal lower extremity: Secondary | ICD-10-CM

## 2016-09-16 DIAGNOSIS — Z7401 Bed confinement status: Secondary | ICD-10-CM | POA: Diagnosis not present

## 2016-09-16 DIAGNOSIS — Z8619 Personal history of other infectious and parasitic diseases: Secondary | ICD-10-CM

## 2016-09-16 DIAGNOSIS — R110 Nausea: Secondary | ICD-10-CM | POA: Diagnosis not present

## 2016-09-16 DIAGNOSIS — R635 Abnormal weight gain: Secondary | ICD-10-CM | POA: Diagnosis not present

## 2016-09-16 DIAGNOSIS — M797 Fibromyalgia: Secondary | ICD-10-CM | POA: Diagnosis present

## 2016-09-16 DIAGNOSIS — M5030 Other cervical disc degeneration, unspecified cervical region: Secondary | ICD-10-CM | POA: Diagnosis not present

## 2016-09-16 DIAGNOSIS — C641 Malignant neoplasm of right kidney, except renal pelvis: Secondary | ICD-10-CM | POA: Diagnosis present

## 2016-09-16 DIAGNOSIS — I2699 Other pulmonary embolism without acute cor pulmonale: Secondary | ICD-10-CM | POA: Diagnosis not present

## 2016-09-16 DIAGNOSIS — R609 Edema, unspecified: Secondary | ICD-10-CM | POA: Diagnosis present

## 2016-09-16 DIAGNOSIS — Z87820 Personal history of traumatic brain injury: Secondary | ICD-10-CM

## 2016-09-16 DIAGNOSIS — B182 Chronic viral hepatitis C: Secondary | ICD-10-CM | POA: Diagnosis present

## 2016-09-16 DIAGNOSIS — D61810 Antineoplastic chemotherapy induced pancytopenia: Secondary | ICD-10-CM | POA: Diagnosis present

## 2016-09-16 DIAGNOSIS — I82409 Acute embolism and thrombosis of unspecified deep veins of unspecified lower extremity: Secondary | ICD-10-CM | POA: Diagnosis not present

## 2016-09-16 DIAGNOSIS — R710 Precipitous drop in hematocrit: Secondary | ICD-10-CM | POA: Diagnosis present

## 2016-09-16 DIAGNOSIS — K5900 Constipation, unspecified: Secondary | ICD-10-CM | POA: Diagnosis not present

## 2016-09-16 DIAGNOSIS — M129 Arthropathy, unspecified: Secondary | ICD-10-CM

## 2016-09-16 DIAGNOSIS — Z8550 Personal history of malignant neoplasm of unspecified urinary tract organ: Secondary | ICD-10-CM | POA: Diagnosis not present

## 2016-09-16 DIAGNOSIS — R1900 Intra-abdominal and pelvic swelling, mass and lump, unspecified site: Secondary | ICD-10-CM

## 2016-09-16 DIAGNOSIS — J45909 Unspecified asthma, uncomplicated: Secondary | ICD-10-CM

## 2016-09-16 DIAGNOSIS — F329 Major depressive disorder, single episode, unspecified: Secondary | ICD-10-CM | POA: Diagnosis present

## 2016-09-16 DIAGNOSIS — D6481 Anemia due to antineoplastic chemotherapy: Secondary | ICD-10-CM

## 2016-09-16 DIAGNOSIS — D696 Thrombocytopenia, unspecified: Secondary | ICD-10-CM | POA: Diagnosis not present

## 2016-09-16 DIAGNOSIS — C661 Malignant neoplasm of right ureter: Secondary | ICD-10-CM

## 2016-09-16 DIAGNOSIS — Z9221 Personal history of antineoplastic chemotherapy: Secondary | ICD-10-CM

## 2016-09-16 DIAGNOSIS — E876 Hypokalemia: Secondary | ICD-10-CM | POA: Diagnosis present

## 2016-09-16 DIAGNOSIS — K219 Gastro-esophageal reflux disease without esophagitis: Secondary | ICD-10-CM

## 2016-09-16 DIAGNOSIS — K769 Liver disease, unspecified: Secondary | ICD-10-CM | POA: Diagnosis not present

## 2016-09-16 DIAGNOSIS — M549 Dorsalgia, unspecified: Secondary | ICD-10-CM | POA: Diagnosis not present

## 2016-09-16 DIAGNOSIS — Z9181 History of falling: Secondary | ICD-10-CM

## 2016-09-16 DIAGNOSIS — K449 Diaphragmatic hernia without obstruction or gangrene: Secondary | ICD-10-CM

## 2016-09-16 DIAGNOSIS — F419 Anxiety disorder, unspecified: Secondary | ICD-10-CM | POA: Diagnosis present

## 2016-09-16 DIAGNOSIS — Z79891 Long term (current) use of opiate analgesic: Secondary | ICD-10-CM | POA: Diagnosis not present

## 2016-09-16 DIAGNOSIS — D638 Anemia in other chronic diseases classified elsewhere: Secondary | ICD-10-CM | POA: Diagnosis present

## 2016-09-16 DIAGNOSIS — C787 Secondary malignant neoplasm of liver and intrahepatic bile duct: Secondary | ICD-10-CM | POA: Diagnosis present

## 2016-09-16 DIAGNOSIS — G894 Chronic pain syndrome: Secondary | ICD-10-CM | POA: Diagnosis present

## 2016-09-16 DIAGNOSIS — Z8781 Personal history of (healed) traumatic fracture: Secondary | ICD-10-CM

## 2016-09-16 DIAGNOSIS — B1920 Unspecified viral hepatitis C without hepatic coma: Secondary | ICD-10-CM | POA: Diagnosis present

## 2016-09-16 DIAGNOSIS — J449 Chronic obstructive pulmonary disease, unspecified: Secondary | ICD-10-CM | POA: Diagnosis present

## 2016-09-16 DIAGNOSIS — R000 Tachycardia, unspecified: Secondary | ICD-10-CM

## 2016-09-16 DIAGNOSIS — Z79899 Other long term (current) drug therapy: Secondary | ICD-10-CM

## 2016-09-16 DIAGNOSIS — T451X5A Adverse effect of antineoplastic and immunosuppressive drugs, initial encounter: Secondary | ICD-10-CM | POA: Diagnosis present

## 2016-09-16 DIAGNOSIS — D649 Anemia, unspecified: Secondary | ICD-10-CM | POA: Diagnosis not present

## 2016-09-16 DIAGNOSIS — D6959 Other secondary thrombocytopenia: Secondary | ICD-10-CM | POA: Diagnosis present

## 2016-09-16 DIAGNOSIS — F4310 Post-traumatic stress disorder, unspecified: Secondary | ICD-10-CM | POA: Diagnosis not present

## 2016-09-16 LAB — CBC
HEMATOCRIT: 18.8 % — AB (ref 35.0–47.0)
Hemoglobin: 6.1 g/dL — ABNORMAL LOW (ref 12.0–16.0)
MCH: 27.2 pg (ref 26.0–34.0)
MCHC: 32.7 g/dL (ref 32.0–36.0)
MCV: 83.1 fL (ref 80.0–100.0)
Platelets: 73 10*3/uL — ABNORMAL LOW (ref 150–440)
RBC: 2.26 MIL/uL — AB (ref 3.80–5.20)
RDW: 21.4 % — AB (ref 11.5–14.5)
WBC: 11.8 10*3/uL — AB (ref 3.6–11.0)

## 2016-09-16 LAB — BASIC METABOLIC PANEL
Anion gap: 6 (ref 5–15)
BUN: 21 mg/dL — AB (ref 6–20)
CO2: 21 mmol/L — ABNORMAL LOW (ref 22–32)
CREATININE: 1.12 mg/dL — AB (ref 0.44–1.00)
Calcium: 8 mg/dL — ABNORMAL LOW (ref 8.9–10.3)
Chloride: 106 mmol/L (ref 101–111)
GFR calc Af Amer: 59 mL/min — ABNORMAL LOW (ref >60)
GFR, EST NON AFRICAN AMERICAN: 51 mL/min — AB (ref >60)
GLUCOSE: 97 mg/dL (ref 65–99)
POTASSIUM: 3.4 mmol/L — AB (ref 3.5–5.1)
SODIUM: 133 mmol/L — AB (ref 135–145)

## 2016-09-16 LAB — HEPARIN LEVEL (UNFRACTIONATED)
HEPARIN UNFRACTIONATED: 0.1 [IU]/mL — AB (ref 0.30–0.70)
HEPARIN UNFRACTIONATED: 0.28 [IU]/mL — AB (ref 0.30–0.70)
Heparin Unfractionated: 0.13 IU/mL — ABNORMAL LOW (ref 0.30–0.70)
Heparin Unfractionated: 0.36 IU/mL (ref 0.30–0.70)

## 2016-09-16 LAB — HEMOGLOBIN AND HEMATOCRIT, BLOOD
HCT: 25.6 % — ABNORMAL LOW (ref 35.0–47.0)
HEMOGLOBIN: 8.8 g/dL — AB (ref 12.0–16.0)

## 2016-09-16 LAB — PREPARE RBC (CROSSMATCH)

## 2016-09-16 MED ORDER — SIMETHICONE 80 MG PO CHEW
160.0000 mg | CHEWABLE_TABLET | Freq: Three times a day (TID) | ORAL | Status: DC | PRN
  Administered 2016-09-16: 160 mg via ORAL
  Filled 2016-09-16 (×2): qty 2

## 2016-09-16 MED ORDER — FLEET ENEMA 7-19 GM/118ML RE ENEM
1.0000 | ENEMA | Freq: Once | RECTAL | Status: AC
  Administered 2016-09-16: 22:00:00 1 via RECTAL

## 2016-09-16 MED ORDER — SODIUM CHLORIDE 0.9 % IV SOLN
Freq: Once | INTRAVENOUS | Status: AC
  Administered 2016-09-16: 10:00:00 via INTRAVENOUS

## 2016-09-16 MED ORDER — HEPARIN BOLUS VIA INFUSION
1700.0000 [IU] | Freq: Once | INTRAVENOUS | Status: AC
  Administered 2016-09-16: 1700 [IU] via INTRAVENOUS
  Filled 2016-09-16: qty 1700

## 2016-09-16 NOTE — Progress Notes (Signed)
ANTICOAGULATION CONSULT NOTE - Initial Consult  Pharmacy Consult for heparin drip  Indication: DVT  Allergies  Allergen Reactions  . Other Swelling    Dust, ragweed  . Gabapentin Other (See Comments)    Jerking motions and confusion.  . Iodides   . Tetanus Toxoids Other (See Comments)    Localized pain and swelling  . Iodine Other (See Comments) and Rash    Limited history. Occurred in 68s, at age 63-12, received contrast at public health facility in [place], while being evaluated for meningitis. Immediate rash - not itchy, "flat, red, perfect circles" over entire body. Has not received iodine contrast since.     Vital Signs: Temp: 98.8 F (37.1 C) (12/30 2010) Temp Source: Oral (12/30 2010) BP: 117/65 (12/30 2010) Pulse Rate: 93 (12/30 2010)  Labs:  Recent Labs  09/15/16 1516 09/15/16 1517 09/16/16 0034  HGB 7.6*  --  6.1*  HCT 22.7*  --  18.8*  PLT 95*  --  73*  APTT  --  24  --   LABPROT  --  13.1  --   INR  --  0.99  --   HEPARINUNFRC  --   --  0.13*  CREATININE 1.15*  --  1.12*    Estimated Creatinine Clearance: 46.5 mL/min (by C-G formula based on SCr of 1.12 mg/dL (H)).   Medical History: Past Medical History:  Diagnosis Date  . Anxiety   . Arthritis   . Asthma    mild  . Cancer (HCC)    urothelia  . Cervical disc disease   . COPD (chronic obstructive pulmonary disease) (HCC)    mild  . Depression   . Dysrhythmia    tachycardia  . Fibromyalgia   . GERD (gastroesophageal reflux disease)   . Hepatitis C   . History of hiatal hernia   . Intracerebral hemorrhage [facility])    Assessment: 65 yo female with left leg DVT. Pharmacy consulted for heparin dosing and monitoring.  12/30 1516: Plts:  95   Hgb: 7.6  Goal of Therapy:  Heparin level 0.3-0.7 units/ml Monitor platelets by anticoagulation protocol: Yes   Plan:  Baseline labs ordered Give 2800 units bolus x 1 Start heparin infusion at 900 units/hr Check anti-Xa level in 6 hours and  daily while on heparin Continue to monitor H&H and platelets.    12/31 0030 heparin level 0.13. 1700 unit bolus and increase rate to 1100 units/hr. Recheck in 6 hours.   Eloise Harman, PharmD, BCPS Clinical Pharmacist 09/16/2016 1:26 AM

## 2016-09-16 NOTE — Progress Notes (Signed)
[place] [facility] [place] NAME: Sandra Landry    MR#:  [id number]  DATE OF BIRTH:  [birth year]  SUBJECTIVE:  CHIEF COMPLAINT:   Chief Complaint  Patient presents with  . Leg Swelling   -Mitigated with left leg swelling, slightly improved. -On heparin drip for DVT. Platelets dropped slightly and hemoglobin dropped to 6.1  REVIEW OF SYSTEMS:  Review of Systems  Constitutional: Positive for malaise/fatigue. Negative for chills, fever and weight loss.  HENT: Negative for ear discharge, ear pain, hearing loss and nosebleeds.   Eyes: Negative for blurred vision, double vision and photophobia.  Respiratory: Negative for cough, hemoptysis, shortness of breath and wheezing.   Cardiovascular: Positive for leg swelling. Negative for chest pain, palpitations and orthopnea.  Gastrointestinal: Negative for abdominal pain, constipation, diarrhea, heartburn, melena, nausea and vomiting.  Genitourinary: Negative for dysuria and urgency.  Musculoskeletal: Negative for back pain and myalgias.  Skin: Negative for rash.  Neurological: Negative for dizziness, sensory change, speech change, focal weakness and headaches.  Endo/Heme/Allergies: Does not bruise/bleed easily.  Psychiatric/Behavioral: Negative for depression.    DRUG ALLERGIES:   Allergies  Allergen Reactions  . Other Swelling    Dust, ragweed  . Gabapentin Other (See Comments)    Jerking motions and confusion.  . Iodides   . Tetanus Toxoids Other (See Comments)    Localized pain and swelling  . Iodine Other (See Comments) and Rash    Limited history. Occurred in 33s, at age 50-12, received contrast at public health facility in [place], while being evaluated for meningitis. Immediate rash - not itchy, "flat, red, perfect circles" over entire body. Has not received iodine contrast since.    VITALS:  Blood pressure 114/79, pulse 93, temperature 98.3 F (36.8 C), temperature source Oral,  resp. rate 18, weight 62.7 kg (138 lb 4.8 oz), SpO2 100 %.  PHYSICAL EXAMINATION:  Physical Exam  GENERAL:  65 y.o.-year-old patient lying in the bed with no acute distress.  EYES: Pupils equal, round, reactive to light and accommodation. No scleral icterus. Extraocular muscles intact.  HEENT: Head atraumatic, normocephalic. Oropharynx and nasopharynx clear.  NECK:  Supple, no jugular venous distention. No thyroid enlargement, no tenderness.  LUNGS: Normal breath sounds bilaterally, no wheezing, rales,rhonchi or crepitation. No use of accessory muscles of respiration.  CARDIOVASCULAR: S1, S2 normal. No murmurs, rubs, or gallops.  ABDOMEN: Soft, nontender, nondistended. Bowel sounds present. No organomegaly or mass.  EXTREMITIES: 2+ left leg edema, no erythema noted. No cyanosis, or clubbing.  NEUROLOGIC: Cranial nerves II through XII are intact. Muscle strength 5/5 in all extremities. Sensation intact. Gait not checked.  PSYCHIATRIC: The patient is alert and oriented x 3.  SKIN: No obvious rash, lesion, or ulcer.    LABORATORY PANEL:   CBC  Recent Labs Lab 09/16/16 0034  WBC 11.8*  HGB 6.1*  HCT 18.8*  PLT 73*   ------------------------------------------------------------------------------------------------------------------  Chemistries   Recent Labs Lab 09/11/16 1100  09/16/16 0034  NA 130*  < > 133*  K 3.3*  < > 3.4*  CL 101  < > 106  CO2 19*  < > 21*  GLUCOSE 120*  < > 97  BUN 29*  < > 21*  CREATININE 1.30*  < > 1.12*  CALCIUM 8.7*  < > 8.0*  AST 23  --   --   ALT 13*  --   --   ALKPHOS 55  --   --   BILITOT  0.7  --   --   < > = values in this interval not displayed. ------------------------------------------------------------------------------------------------------------------  Cardiac Enzymes No results for input(s): TROPONINI in the last 168  hours. ------------------------------------------------------------------------------------------------------------------  RADIOLOGY:  US Venous Img Lower Unilateral Left  Result Date: 09/15/2016 CLINICAL DATA:  Left lower extremity pain and edema. History of urothelial carcinoma. EXAM: LEFT LOWER EXTREMITY VENOUS DOPPLER ULTRASOUND TECHNIQUE: Gray-scale sonography with graded compression, as well as color Doppler and duplex ultrasound were performed to evaluate the lower extremity deep venous systems from the level of the common femoral vein and including the common femoral, femoral, profunda femoral, popliteal and calf veins including the posterior tibial, peroneal and gastrocnemius veins when visible. The superficial great saphenous vein was also interrogated. Spectral Doppler was utilized to evaluate flow at rest and with distal augmentation maneuvers in the common femoral, femoral and popliteal veins. COMPARISON:  None. FINDINGS: Contralateral Common Femoral Vein: Respiratory phasicity is normal and symmetric with the symptomatic side. No evidence of thrombus. Normal compressibility. Common Femoral Vein: Occlusive thrombus identified. Saphenofemoral Junction: Occlusive thrombus identified. Profunda Femoral Vein: Occlusive thrombus identified. Femoral Vein: Occlusive thrombus identified. Popliteal Vein: Occlusive thrombus identified. Calf Veins: Occlusive thrombus identified. Superficial Great Saphenous Vein: Not fully assessed. Venous Reflux:  None. Other Findings:  No abnormal fluid collections. IMPRESSION: Extensive left lower extremity DVT involving the entire visualized deep veins from the level of the common femoral vein to the calf veins. Electronically Signed   By: Aletta Edouard M.D.   On: 09/15/2016 16:14    EKG:   Orders placed or performed during the hospital encounter of 08/08/16  . ED EKG  . 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  #1 left lower extremity DVT-currently on anticoagulation with heparin drip. -History of subarachnoid hemorrhage 2 months ago, but according to neurosurgeon, can be anticoagulated. -Only risk with long-term anticoagulation would be dropping blood counts. Awaiting oncology input to see if it's okay to start oral anticoagulation for long-term purposes. -If not a good candidate for that, then we'll consult vascular for IVC filter placement. Patient is not a candidate for thrombolytic treatment for DVT -For now continue heparin drip.  #2 acute on chronic anemia-secondary to chemotherapy and also on heparin drip. -No active bleeding noted. 2 units packed RBC transfusion ordered. -Platelets also dropped, no indication for platelet transfusion  #3 stage IV urothelial cancer-hematology consult requested. Patient is currently on chemotherapy  #4 COPD-stable.  #5 hypokalemia-being replaced  #6 DVT prophylaxis-currently on heparin drip     All the records are reviewed and case discussed with Care Management/Social Workerr. Management plans discussed with the patient, family and they are in agreement.  CODE STATUS: DO NOT RESUSCITATE  TOTAL TIME TAKING CARE OF THIS PATIENT: 37 minutes.   POSSIBLE D/C IN 1-2 DAYS, DEPENDING ON CLINICAL CONDITION.   Gladstone Lighter M.D on 09/16/2016 at 8:57 AM  Between 7am to 6pm - Pager - [phone]  After 6pm go to [email or web address] - Proofreader  [facility]  Office  [phone]  CC: Primary care physician; BLISS, Lynnell Jude, MD

## 2016-09-16 NOTE — Plan of Care (Signed)
Problem: Bowel/Gastric: Goal: Will not experience complications related to bowel motility Outcome: Progressing Pt alert and oriented, continues in heparin drip, now at 11 ml/hr for DVT, no signs of bleeding, up to the bathroom with one assist. Hemoglobin 6.1, MD notified. Continue to monitor

## 2016-09-16 NOTE — Progress Notes (Signed)
Pharmacy order to increase rate of Heparin gtt rate to 12.5 ml/hr. Rate increased per order.

## 2016-09-16 NOTE — Consult Note (Signed)
Consult Note  Patient name: Sandra Landry MRN: [id number] DOB: [birth year] Sex: female  Consulting Physician:  Dr. Tressia Miners  Reason for Consult:  Chief Complaint  Patient presents with  . Leg Swelling    HISTORY OF PRESENT ILLNESS: Raneen Gery  is a 65 y.o. female with a known history of Urothelial cancer stage IV, COPD, asthma on chemotherapy who came  to the emergency room complaining of 2 days of left leg swelling. No shortness of breath or chest pain. Lower extremity Dopplers showed extensive left lower extremity DVT extending from common femoral vein down to the leg veins.  She has a history of SAH 2 months ago. Neurosurgery was consulted and felt heparinization was ok and so a heparin drip was started.  Today, she has had a drop in her platelets and Hb, requiring prbc transfusion.  She has received a platelet transfusion in the past at [facility].  She has been bed ridden recently from her chemotherapy.  Past Medical History:  Diagnosis Date  . Anxiety   . Arthritis   . Asthma    mild  . Cancer (HCC)    urothelia  . Cervical disc disease   . COPD (chronic obstructive pulmonary disease) (HCC)    mild  . Depression   . Dysrhythmia    tachycardia  . Fibromyalgia   . GERD (gastroesophageal reflux disease)   . Hepatitis C   . History of hiatal hernia   . Intracerebral hemorrhage [facility])     Past Surgical History:  Procedure Laterality Date  . BREAST REDUCTION SURGERY    . cervical discectomy with fusion    . COLONOSCOPY  01/2016  . EUS N/A 06/21/2016   Procedure: LOWER ENDOSCOPIC ULTRASOUND (EUS);  Surgeon: Reita Cliche, MD;  Location: [facility] ENDOSCOPY;  Service: Endoscopy;  Laterality: N/A;  . IR GENERIC HISTORICAL  07/11/2016   IR NEPHROSTOMY PLACEMENT RIGHT 07/11/2016 Greggory Keen, MD [facility]-INTERV RAD  . IR GENERIC HISTORICAL  08/31/2016   IR NEPHROSTOMY TUBE CHANGE 08/31/2016 [facility]-INTERV RAD  . PERIPHERAL VASCULAR CATHETERIZATION N/A 07/23/2016   Procedure:  Glori Luis Cath Insertion;  Surgeon: Algernon Huxley, MD;  Location: [facility] CV LAB;  Service: Cardiovascular;  Laterality: N/A;    Social History   Social History  . Marital status: Single    Spouse name: N/A  . Number of children: N/A  . Years of education: N/A   Occupational History  . Not on file.   Social History Main Topics  . Smoking status: Never Smoker  . Smokeless tobacco: Never Used  . Alcohol use No  . Drug use:     Frequency: 14.0 times per week    Types: Marijuana     Comment: smoked this morning  . Sexual activity: Not on file   Other Topics Concern  . Not on file   Social History Narrative  . No narrative on file    Family History  Problem Relation Age of Onset  . Cancer Neg Hx     Allergies as of 09/15/2016 - Review Complete 09/15/2016  Allergen Reaction Noted  . Other Swelling 03/23/2015  . Gabapentin Other (See Comments) 07/30/2016  . Iodides  11/06/2014  . Tetanus toxoids Other (See Comments) 05/25/2016  . Iodine Other (See Comments) and Rash     Current Facility-Administered Medications on File Prior to Encounter  Medication Dose Route Frequency Provider Last Rate Last Dose  . sodium chloride flush (NS) 0.9 % injection 10  mL  10 mL Intravenous PRN Lequita Asal, MD   10 mL at 07/26/16 0935   Current Outpatient Prescriptions on File Prior to Encounter  Medication Sig Dispense Refill  . baclofen (LIORESAL) 20 MG tablet Take 20 mg by mouth 4 (four) times daily as needed for muscle spasms.     Marland Kitchen dexamethasone (DECADRON) 4 MG tablet Take 2 tablets by mouth once a day on the day after cisplatin chemotherapy and then take 2 tablets two times a day for 2 days. Take with food. 30 tablet 1  . diltiazem (CARDIZEM CD) 120 MG 24 hr capsule Take 240 mg by mouth at bedtime.     . diphenhydrAMINE (BENADRYL) 25 MG tablet Take 25-50 mg by mouth every 6 (six) hours as needed for allergies.     Marland Kitchen escitalopram (LEXAPRO) 20 MG tablet Take 40 mg by mouth at  bedtime.     . Iron-Vitamin C (VITRON-C PO) Take 1 tablet by mouth daily.    Marland Kitchen lactulose (CHRONULAC) 10 GM/15ML solution Take 10 g by mouth 2 (two) times daily.     Marland Kitchen lidocaine-prilocaine (EMLA) cream Apply cream to port site 1 hour prior to chemotherapy, place small amount of saran wrap to cover cream and protect clothing. 30 g 1  . LORazepam (ATIVAN) 0.5 MG tablet Take 1 tablet (0.5 mg total) by mouth every 6 (six) hours as needed (Nausea or vomiting). 30 tablet 0  . megestrol (MEGACE) 400 MG/10ML suspension Take 5 mLs (200 mg total) by mouth daily. 240 mL 0  . morphine (MS CONTIN) 15 MG 12 hr tablet Take 15 mg by mouth 2 (two) times daily.    . NEOMYCIN-POLYMYXIN-HYDROCORTISONE (CORTISPORIN) 1 % SOLN otic solution INSTILL 2 DROPS TO AFFECTED EAR 4 TIMES DAILY  2  . ondansetron (ZOFRAN) 8 MG tablet Take 1 tablet (8 mg total) by mouth 2 (two) times daily as needed. Start on the third day after cisplatin chemotherapy. 30 tablet 1  . oxyCODONE-acetaminophen (PERCOCET/ROXICET) 5-325 MG tablet Take 1 tablet by mouth every 4 (four) hours as needed for moderate pain or severe pain.     . polyethylene glycol (MIRALAX / GLYCOLAX) packet Take 17 g by mouth daily as needed for mild constipation.     . potassium chloride (K-DUR) 10 MEQ tablet Take 1 tablet (10 mEq total) by mouth daily. (Patient taking differently: Take 20 mEq by mouth daily. ) 30 tablet 1  . prochlorperazine (COMPAZINE) 5 MG/ML injection Inject 10 mg into the muscle every 4 (four) hours as needed (nausea).     . promethazine (PHENERGAN) 25 MG suppository Place 25 mg rectally every 6 (six) hours as needed for nausea or vomiting.     . ranitidine (ZANTAC) 300 MG tablet Take 300 mg by mouth 2 (two) times daily.        REVIEW OF SYSTEMS: Constitutional: Positive for malaise/fatigue and weight loss. Negative for chills and fever.  HENT: Negative for hearing loss and nosebleeds.   Eyes: Negative for blurred vision, double vision and pain.    Respiratory: Negative for cough, hemoptysis, sputum production, shortness of breath and wheezing.   Cardiovascular: Positive for leg swelling. Negative for chest pain, palpitations and orthopnea.  Gastrointestinal: Negative for abdominal pain, constipation, diarrhea, nausea and vomiting.  Genitourinary: Negative for dysuria and hematuria.  Musculoskeletal: Negative for back pain, falls and myalgias.  Skin: Negative for rash.  Neurological: Positive for weakness. Negative for dizziness, tremors, sensory change, speech change, focal weakness, seizures and headaches.  Endo/Heme/Allergies: Does not bruise/bleed easily.  Psychiatric/Behavioral: Negative for depression and memory loss. The patient is not nervous/anxious.    PHYSICAL EXAMINATION: General: The patient appears their stated age.  Vital signs are BP 116/67   Pulse 73   Temp 98.7 F (37.1 C) (Oral)   Resp 18   Ht 5\' 6"  (1.676 m)   Wt 138 lb 4.8 oz (62.7 kg)   SpO2 99%   BMI 22.32 kg/m  Pulmonary: Respirations are non-labored HEENT:  No gross abnormalities Musculoskeletal: There are no major deformities.   Neurologic: No focal weakness or paresthesias are detected, Skin: There are no ulcer or rashes noted. Psychiatric: The patient has normal affect. Cardiovascular: There is a regular rate and rhythm without significant murmur appreciated.  Left leg is edematous   Diagnostic Studies: I have reviewed her u/s with the following findings: Extensive left lower extremity DVT involving the entire visualized deep veins from the level of the common femoral vein to the calf veins.    Assessment:  Left leg DVT Plan: The patient is not a candidate for thrombolysis, given her history of SAH 2 months ago.  I have recommended treatment with IV heparin to help stabilize her DVT.  She needs to  Keep her leg elevated and would benefot from external compression.  Oncology feels that heparinization is appropriate as long as her platelet  count stays above 50,000.  I doubt she will be a candidate for oral anticaogulation at discharge, and so we discussed placing a IVC filter which would be permanent.  As long as she can continue IV heparin, I will plan for this to be done on Tuesday.  If her heparin has to be discontinued, I will place a IVC filter at that time.     Eldridge Abrahams, M.D. [facility] Office: [phone] Pager:  [phone]

## 2016-09-16 NOTE — Progress Notes (Signed)
Initial Nutrition Assessment  DOCUMENTATION CODES:   Not applicable  INTERVENTION:  1. Provide snacks as desired for patient, she does not want anything specific at this time  NUTRITION DIAGNOSIS:   Malnutrition related to chronic illness as evidenced by severe depletion of muscle mass, severe depletion of body fat.  GOAL:   Patient will meet greater than or equal to 90% of their needs  MONITOR:   I & O's, Labs, Weight trends, PO intake  REASON FOR ASSESSMENT:   Malnutrition Screening Tool    ASSESSMENT:   Sandra Landry  is a 65 y.o. female with a known history of Urothelial cancer stage IV, COPD, asthma on chemotherapy for his to the emergency room complaining of 2 days of left leg swelling. No shortness of breath or chest pain  Spoke with Ms. Veverly Fells, Daughter at bedside. Patient reports some nausea and vomiting PTA, none at this moment but continues to complain of no appetite which has been going on "for a long time." Daughter reports patients started year around 160# now currently 138# She had a little PO intake this morning, did not eat much this afternoon. Documented meal completion 40% Nutrition-Focused physical exam completed. Findings are severe fat depletion, severe muscle depletion, and no edema.   Labs and medications reviewed.  Diet Order:  Diet regular Room service appropriate? Yes; Fluid consistency: Thin  Skin:  Reviewed, no issues  Last BM:  09/11/2016  Height:   Ht Readings from Last 1 Encounters:  09/16/16 5\' 6"  (1.676 m)    Weight:   Wt Readings from Last 1 Encounters:  09/16/16 138 lb 4.8 oz (62.7 kg)    Ideal Body Weight:  59.09 kg  BMI:  Body mass index is 22.32 kg/m.  Estimated Nutritional Needs:   Kcal:  1881-2200 calories  Protein:  81-107 gm  Fluid:  >/= 1.8L  EDUCATION NEEDS:   No education needs identified at this time  Satira Anis. Launi Asencio, MS, RD LDN Inpatient Clinical Dietitian Pager [phone]

## 2016-09-16 NOTE — Progress Notes (Signed)
ANTICOAGULATION CONSULT NOTE - Follow UP  Pharmacy Consult for heparin drip  Indication: DVT  Allergies  Allergen Reactions  . Other Swelling    Dust, ragweed  . Gabapentin Other (See Comments)    Jerking motions and confusion.  . Iodides   . Tetanus Toxoids Other (See Comments)    Localized pain and swelling  . Iodine Other (See Comments) and Rash    Limited history. Occurred in 71s, at age 65-12, received contrast at public health facility in [place], while being evaluated for meningitis. Immediate rash - not itchy, "flat, red, perfect circles" over entire body. Has not received iodine contrast since.     Vital Signs: Temp: 98.7 F (37.1 C) (12/31 1638) Temp Source: Oral (12/31 1638) BP: 116/67 (12/31 1638) Pulse Rate: 73 (12/31 1638)  Labs:  Recent Labs  09/15/16 1516 09/15/16 1517 09/16/16 0034 09/16/16 0757 09/16/16 1750 09/16/16 1840  HGB 7.6*  --  6.1*  --  8.8*  --   HCT 22.7*  --  18.8*  --  25.6*  --   PLT 95*  --  73*  --   --   --   APTT  --  24  --   --   --   --   LABPROT  --  13.1  --   --   --   --   INR  --  0.99  --   --   --   --   HEPARINUNFRC  --   --  0.10*  0.13* 0.36  --  0.28*  CREATININE 1.15*  --  1.12*  --   --   --     Estimated Creatinine Clearance: 48.1 mL/min (by C-G formula based on SCr of 1.12 mg/dL (H)).   Medical History: Past Medical History:  Diagnosis Date  . Anxiety   . Arthritis   . Asthma    mild  . Cancer (HCC)    urothelia  . Cervical disc disease   . COPD (chronic obstructive pulmonary disease) (HCC)    mild  . Depression   . Dysrhythmia    tachycardia  . Fibromyalgia   . GERD (gastroesophageal reflux disease)   . Hepatitis C   . History of hiatal hernia   . Intracerebral hemorrhage [facility])    Assessment: 65 yo female with left leg DVT. Pharmacy consulted for heparin dosing and monitoring.  12/30 1516: Plts:  95   Hgb: 7.6  Goal of Therapy:  Heparin level 0.3-0.7 units/ml Monitor platelets by  anticoagulation protocol: Yes   Plan:  Baseline labs ordered Give 2800 units bolus x 1 Start heparin infusion at 900 units/hr Check anti-Xa level in 6 hours and daily while on heparin Continue to monitor H&H and platelets.    12/31 0030 heparin level 0.13. 1700 unit bolus and increase rate to 1100 units/hr. Recheck in 6 hours.  12/31 ~ 08:00 HL = 0.36.  Continue with current drip rate.  Will recheck HL today at 14:00.  12/31 1840 HL =0.28  Will increase infusion to 1250 units/hr. Recheck HL in 6 hours.   Pernell Dupre, [facility] Clinical Pharmacist 09/16/2016 7:35 PM

## 2016-09-16 NOTE — Progress Notes (Signed)
Notified MD on call of Hemoglobin 6.1 by text. No sigs of bleeding noted. Awaiting for new orders.

## 2016-09-16 NOTE — Consult Note (Addendum)
[facility]  Date of admission:  09/15/2016  Inpatient day:  09/16/2016  Consulting physician:  Dr. Hillary Bow   Reason for Consultation:  DVT.  Pancytopenia.  On chemo.  Chief Complaint: Sandra Landry is a 65 y.o. female with locally advanced right ureteral carcinoma currently day 13 of cycle #2 cisplatin and gemcitabine who was admitted with a left lower extremity DVT.  HPI:  The patient was diagnosed with locally advanced right ureteral carcinoma on 06/21/2016.  She underwent right nephrostomy tube placement on 07/11/2016 for right sided hydroureteronephrosis.  She received 1 cycle #1 cisplatin and gemcitabine (07/30/2016 - 08/06/2016).  Cisplatin was given as a split dose (day 1 and 2).  Course was complicated by an admission to [facility] on 08/08/2016 following a fall.  Head MRI revealed a subarachnoid hemorrhage.  She had atrial fibrillation with RVR.  Platelets were maintained > 50,000.  She received 2 units of PRBCs and 1 unit of platelets.  Chest, abdomen, and pelvic CT scan on 08/08/2016 revealed a mild age-indeterminate compression fracture of T6. There was a 6 mm hypodensity at the dome of the liver too small to characterize and a few additional small subcentimeter hypodense scattered lesions in the liver. There was a 7.3 x 6.4 x 7.7 cm right pelvic mass invading the sacrum and causing a lytic lesion with pathologic fracture.  The mass likely invaded the right psoas muscle.  She was seen by Dr. Randa Evens on 09/04/2016 and 09/11/2016.  She received day 1 and 2 split dose cisplatin and day 1 and 8 gemcitabine.  Gemcitabine was reduced by 20%.  Platelet count was 344,000 on 09/04/2016 and 242,000 on 09/11/2016.  Hematocrit was 30.5 with a hemoglobin of 10.2 on 09/11/2016.  She presented with a 3-4 day history of left lower extremity swelling.  She has been bed ridden.  Her partner helps her with her ADLs.  She has been eating and drinking well.  She denies any  nausea, vomiting or diarrhea.  She has had constipation x 1 week.  Left lower extremity duplex on 09/15/2016 revealed extensive left lower extremity DVT involving the entire visualized deep veins from the level of the common femoral vein to the calf veins.  Platelet count was 95,000.  Hematocrit was 22.7 with a hemoglobin of 7.6.  Neurosurgery was contacted.  Per notes, anticoagulation was not felt contraindicated.  She was started on a heparin drip.  Platelet count is 73,000.  Hematocrit is 18.8 with a hemoglobin of 6.1.    She denies any bruising or bleeding, melena or hematochezia, or hematuria.   Past Medical History:  Diagnosis Date  . Anxiety   . Arthritis   . Asthma    mild  . Cancer (HCC)    urothelia  . Cervical disc disease   . COPD (chronic obstructive pulmonary disease) (HCC)    mild  . Depression   . Dysrhythmia    tachycardia  . Fibromyalgia   . GERD (gastroesophageal reflux disease)   . Hepatitis C   . History of hiatal hernia   . Intracerebral hemorrhage [facility])     Past Surgical History:  Procedure Laterality Date  . BREAST REDUCTION SURGERY    . cervical discectomy with fusion    . COLONOSCOPY  01/2016  . EUS N/A 06/21/2016   Procedure: LOWER ENDOSCOPIC ULTRASOUND (EUS);  Surgeon: Reita Cliche, MD;  Location: [facility] ENDOSCOPY;  Service: Endoscopy;  Laterality: N/A;  . IR GENERIC HISTORICAL  07/11/2016  IR NEPHROSTOMY PLACEMENT RIGHT 07/11/2016 Greggory Keen, MD [facility]-INTERV RAD  . IR GENERIC HISTORICAL  08/31/2016   IR NEPHROSTOMY TUBE CHANGE 08/31/2016 [facility]-INTERV RAD  . PERIPHERAL VASCULAR CATHETERIZATION N/A 07/23/2016   Procedure: Glori Luis Cath Insertion;  Surgeon: Algernon Huxley, MD;  Location: [place] CV LAB;  Service: Cardiovascular;  Laterality: N/A;    Family History  Problem Relation Age of Onset  . Cancer Neg Hx     Social History:  reports that she has never smoked. She has never used smokeless tobacco. She reports that she uses drugs,  including Marijuana, about 14 times per week. She reports that she does not drink alcohol.  She lives in [place].  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  She has also worked in the Physicist, medical.  Her partner is Darliss Ridgel, a Marine scientist. The patient is accompanied by her nurse today.  Allergies:  Allergies  Allergen Reactions  . Other Swelling    Dust, ragweed  . Gabapentin Other (See Comments)    Jerking motions and confusion.  . Iodides   . Tetanus Toxoids Other (See Comments)    Localized pain and swelling  . Iodine Other (See Comments) and Rash    Limited history. Occurred in 37s, at age 88-12, received contrast at public health facility in [place], while being evaluated for meningitis. Immediate rash - not itchy, "flat, red, perfect circles" over entire body. Has not received iodine contrast since.    Medications Prior to Admission  Medication Sig Dispense Refill  . baclofen (LIORESAL) 20 MG tablet Take 20 mg by mouth 4 (four) times daily as needed for muscle spasms.     . bisacodyl (DULCOLAX) 5 MG EC tablet Take 5 mg by mouth daily as needed for moderate constipation.    Marland Kitchen dexamethasone (DECADRON) 4 MG tablet Take 2 tablets by mouth once a day on the day after cisplatin chemotherapy and then take 2 tablets two times a day for 2 days. Take with food. 30 tablet 1  . diltiazem (CARDIZEM CD) 120 MG 24 hr capsule Take 240 mg by mouth at bedtime.     . diphenhydrAMINE (BENADRYL) 25 MG tablet Take 25-50 mg by mouth every 6 (six) hours as needed for allergies.     Marland Kitchen escitalopram (LEXAPRO) 20 MG tablet Take 40 mg by mouth at bedtime.     . Iron-Vitamin C (VITRON-C PO) Take 1 tablet by mouth daily.    Marland Kitchen lactulose (CHRONULAC) 10 GM/15ML solution Take 10 g by mouth 2 (two) times daily.     Marland Kitchen lidocaine-prilocaine (EMLA) cream Apply cream to port site 1 hour prior to chemotherapy, place small amount of saran wrap to cover cream and protect clothing. 30 g 1  . LORazepam (ATIVAN) 0.5 MG tablet  Take 1 tablet (0.5 mg total) by mouth every 6 (six) hours as needed (Nausea or vomiting). 30 tablet 0  . megestrol (MEGACE) 400 MG/10ML suspension Take 5 mLs (200 mg total) by mouth daily. 240 mL 0  . morphine (MS CONTIN) 15 MG 12 hr tablet Take 15 mg by mouth 2 (two) times daily.    . NEOMYCIN-POLYMYXIN-HYDROCORTISONE (CORTISPORIN) 1 % SOLN otic solution INSTILL 2 DROPS TO AFFECTED EAR 4 TIMES DAILY  2  . ondansetron (ZOFRAN) 8 MG tablet Take 1 tablet (8 mg total) by mouth 2 (two) times daily as needed. Start on the third day after cisplatin chemotherapy. 30 tablet 1  . oxyCODONE-acetaminophen (PERCOCET/ROXICET) 5-325 MG tablet Take 1 tablet  by mouth every 4 (four) hours as needed for moderate pain or severe pain.     . polyethylene glycol (MIRALAX / GLYCOLAX) packet Take 17 g by mouth daily as needed for mild constipation.     . potassium chloride (K-DUR) 10 MEQ tablet Take 1 tablet (10 mEq total) by mouth daily. (Patient taking differently: Take 20 mEq by mouth daily. ) 30 tablet 1  . prochlorperazine (COMPAZINE) 5 MG/ML injection Inject 10 mg into the muscle every 4 (four) hours as needed (nausea).     . promethazine (PHENERGAN) 25 MG suppository Place 25 mg rectally every 6 (six) hours as needed for nausea or vomiting.     . ranitidine (ZANTAC) 300 MG tablet Take 300 mg by mouth 2 (two) times daily.       Review of Systems: GENERAL:  Fatigue.  No fever or sweats.  Weight gain of 12 pounds since last week (fluid weight). PERFORMANCE STATUS (ECOG):  3 HEENT:  Decreased vision in left eye since fall (diplopia when eye open).  No runny nose, sore throat, mouth sores or tenderness. Lungs: Shortness of breath.  No cough.  No hemoptysis. Cardiac:  No chest pain, palpitations, orthopnea, or PND. GI:  Eating better.  Constipation.  Chronic nausea.  No diarrhea, melena or hematochezia. GU:  Right urostomy tube.  No urgency, frequency, or dysuria. Musculoskeletal:  Right sided back pain.  No joint  pain.  No muscle tenderness. Extremities: Left leg swelling x 3-4 days. Skin:  No rashes or skin changes. Neuro:  No falls.  No numbness or weakness, or coordination issues. Endocrine:  No diabetes, thyroid issues, hot flashes or night sweats. Psych:  Hypersensitive.  PTSD.  Depression.  Anxiety. Pain:  Low back pain. Review of systems:  All other systems reviewed and found to be negative.  Physical Exam:  Blood pressure (!) 93/51, pulse 69, temperature 98.2 F (36.8 C), temperature source Oral, resp. rate 18, weight 138 lb 4.8 oz (62.7 kg), SpO2 100 %.  GENERAL:  Thin, fatigued appearing woman lying in bed on the medical unit in no acute distress.  MENTAL STATUS: Alert and oriented to person, place and time. HEAD:Short brown hair. Normocephalic, atraumatic, face symmetric, no Cushingoid features. EYES:Pupils equal round and reactive to light and accomodation. No conjunctivitis or scleral icterus.  Intermittently closes left eye. AYT:KZSWFUXNAT clear without lesion. No upper teeth. Tonguenormal. Mucous membranes moist. RESPIRATORY:Clear to auscultationwithout rales, wheezes or rhonchi. CARDIOVASCULAR:Regular rate andrhythmwithout murmur, rub or gallop. ABDOMEN:Soft, nontender with active bowel sounds and no hepatosplenomegaly.  BACK:  Right sided urostomy with dressing in place. SKIN: No rashes, ulcers or lesions. EXTREMITIES: 2-3+ left lower extremity edema.  No skin discoloration or tenderness.  LYMPHNODES: No palpable cervical, supraclavicular, axillary or inguinal adenopathy  NEUROLOGICAL:  Appropriate. PSYCH: Appropriate.   Results for orders placed or performed during the hospital encounter of 09/15/16 (from the past 48 hour(s))  CBC     Status: Abnormal   Collection Time: 09/15/16  3:16 PM  Result Value Ref Range   WBC 16.0 (H) 3.6 - 11.0 K/uL   RBC 2.70 (L) 3.80 - 5.20 MIL/uL   Hemoglobin 7.6 (L) 12.0 - 16.0 g/dL   HCT 22.7 (L) 35.0 - 47.0 %   MCV  83.9 80.0 - 100.0 fL   MCH 28.0 26.0 - 34.0 pg   MCHC 33.3 32.0 - 36.0 g/dL   RDW 21.8 (H) 11.5 - 14.5 %   Platelets 95 (L) 150 - 440 K/uL  Basic  metabolic panel     Status: Abnormal   Collection Time: 09/15/16  3:16 PM  Result Value Ref Range   Sodium 134 (L) 135 - 145 mmol/L   Potassium 4.3 3.5 - 5.1 mmol/L   Chloride 105 101 - 111 mmol/L   CO2 21 (L) 22 - 32 mmol/L   Glucose, Bld 128 (H) 65 - 99 mg/dL   BUN 22 (H) 6 - 20 mg/dL   Creatinine, Ser 1.15 (H) 0.44 - 1.00 mg/dL   Calcium 8.4 (L) 8.9 - 10.3 mg/dL   GFR calc non Af Amer 50 (L) >60 mL/min   GFR calc Af Amer 57 (L) >60 mL/min    Comment: (NOTE) The eGFR has been calculated using the CKD EPI equation. This calculation has not been validated in all clinical situations. eGFR's persistently <60 mL/min signify possible Chronic Kidney Disease.    Anion gap 8 5 - 15  Protime-INR     Status: None   Collection Time: 09/15/16  3:17 PM  Result Value Ref Range   Prothrombin Time 13.1 11.4 - 15.2 seconds   INR 0.99   APTT     Status: None   Collection Time: 09/15/16  3:17 PM  Result Value Ref Range   aPTT 24 24 - 36 seconds  Type and screen     Status: None (Preliminary result)   Collection Time: 09/15/16  7:02 PM  Result Value Ref Range   ABO/RH(D) A NEG    Antibody Screen NEG    Sample Expiration 09/18/2016    Unit Number [id number]    Blood Component Type RBC, LR IRR    Unit division 00    Status of Unit ALLOCATED    Transfusion Status OK TO TRANSFUSE    Crossmatch Result Compatible    Unit Number [id number]    Blood Component Type RBC, LR IRR    Unit division 00    Status of Unit ISSUED    Transfusion Status OK TO TRANSFUSE    Crossmatch Result Compatible   CBC     Status: Abnormal   Collection Time: 09/16/16 12:34 AM  Result Value Ref Range   WBC 11.8 (H) 3.6 - 11.0 K/uL   RBC 2.26 (L) 3.80 - 5.20 MIL/uL   Hemoglobin 6.1 (L) 12.0 - 16.0 g/dL   HCT 18.8 (L) 35.0 - 47.0 %   MCV 83.1 80.0 - 100.0 fL    MCH 27.2 26.0 - 34.0 pg   MCHC 32.7 32.0 - 36.0 g/dL   RDW 21.4 (H) 11.5 - 14.5 %   Platelets 73 (L) 150 - 440 K/uL  Basic metabolic panel     Status: Abnormal   Collection Time: 09/16/16 12:34 AM  Result Value Ref Range   Sodium 133 (L) 135 - 145 mmol/L   Potassium 3.4 (L) 3.5 - 5.1 mmol/L   Chloride 106 101 - 111 mmol/L   CO2 21 (L) 22 - 32 mmol/L   Glucose, Bld 97 65 - 99 mg/dL   BUN 21 (H) 6 - 20 mg/dL   Creatinine, Ser 1.12 (H) 0.44 - 1.00 mg/dL   Calcium 8.0 (L) 8.9 - 10.3 mg/dL   GFR calc non Af Amer 51 (L) >60 mL/min   GFR calc Af Amer 59 (L) >60 mL/min    Comment: (NOTE) The eGFR has been calculated using the CKD EPI equation. This calculation has not been validated in all clinical situations. eGFR's persistently <60 mL/min signify possible Chronic Kidney Disease.    Anion  gap 6 5 - 15  Heparin level (unfractionated)     Status: Abnormal   Collection Time: 09/16/16 12:34 AM  Result Value Ref Range   Heparin Unfractionated 0.13 (L) 0.30 - 0.70 IU/mL    Comment:        IF HEPARIN RESULTS ARE BELOW EXPECTED VALUES, AND PATIENT DOSAGE HAS BEEN CONFIRMED, SUGGEST FOLLOW UP TESTING OF ANTITHROMBIN III LEVELS.   Prepare RBC     Status: None   Collection Time: 09/16/16  7:30 AM  Result Value Ref Range   Order Confirmation ORDER PROCESSED BY BLOOD BANK   Heparin level (unfractionated)     Status: None   Collection Time: 09/16/16  7:57 AM  Result Value Ref Range   Heparin Unfractionated 0.36 0.30 - 0.70 IU/mL    Comment:        IF HEPARIN RESULTS ARE BELOW EXPECTED VALUES, AND PATIENT DOSAGE HAS BEEN CONFIRMED, SUGGEST FOLLOW UP TESTING OF ANTITHROMBIN III LEVELS.    US Venous Img Lower Unilateral Left  Result Date: 09/15/2016 CLINICAL DATA:  Left lower extremity pain and edema. History of urothelial carcinoma. EXAM: LEFT LOWER EXTREMITY VENOUS DOPPLER ULTRASOUND TECHNIQUE: Gray-scale sonography with graded compression, as well as color Doppler and duplex  ultrasound were performed to evaluate the lower extremity deep venous systems from the level of the common femoral vein and including the common femoral, femoral, profunda femoral, popliteal and calf veins including the posterior tibial, peroneal and gastrocnemius veins when visible. The superficial great saphenous vein was also interrogated. Spectral Doppler was utilized to evaluate flow at rest and with distal augmentation maneuvers in the common femoral, femoral and popliteal veins. COMPARISON:  None. FINDINGS: Contralateral Common Femoral Vein: Respiratory phasicity is normal and symmetric with the symptomatic side. No evidence of thrombus. Normal compressibility. Common Femoral Vein: Occlusive thrombus identified. Saphenofemoral Junction: Occlusive thrombus identified. Profunda Femoral Vein: Occlusive thrombus identified. Femoral Vein: Occlusive thrombus identified. Popliteal Vein: Occlusive thrombus identified. Calf Veins: Occlusive thrombus identified. Superficial Great Saphenous Vein: Not fully assessed. Venous Reflux:  None. Other Findings:  No abnormal fluid collections. IMPRESSION: Extensive left lower extremity DVT involving the entire visualized deep veins from the level of the common femoral vein to the calf veins. Electronically Signed   By: Aletta Edouard M.D.   On: 09/15/2016 16:14    Assessment:  The patient is a 65 y.o. woman with locally advanced right ureteral carcinoma currentlyday 13 of cycle #2 cisplatin and gemcitabine who was admitted with an extensive left lower extremity DVT.  Chest, abdomen, and pelvic CT scan on 08/08/2016 revealed a mild age-indeterminate compression fracture of T6. There was a 6 mm hypodensity at the dome of the liver too small to characterize and a few additional small subcentimeter hypodense scattered lesions in the liver. There was a 7.3 x 6.4 x 7.7 cm right pelvic mass invading the sacrum and causing a lytic lesion with pathologic fracture.  The mass likely  invaded the right psoas muscle.  She has anemia and thrombocytopenia.  Hematocrit is 18.8, hemoglobin 6.1, and platelets 73,000.  She denies any bleeding.  Plan:   1.  Oncology:  Day 13 of cycle #2 cisplatin and gemcitabine.  Although gemcitabine decreased by 20% with cycle #2, suspect platelet count will decrease further.  I believe platelet count decreased to 20,000 with cycle #1 (she was at [facility]) and she received a platelet transfusion to maintain platelets > 50,000 s/p subarachnoid hemorrhage.    Patent can be anticoagulated with heparin as  long as platelet count > 50,000.  Agree with vascular surgery consult, as patient may need an IVC filter.  Discussed with patient.  No plans to switch to any oral anticoagulation given dropping platelet count.    Discussed with patient restaging CT scans after recovery from this cycle and consideration of palliative radiation and/or Tecentriq.  Patient not a candidate for debulking surgery.  2.  Hematology:  Anemia and thrombocytopenia secondary to chemotherapy (see above).  Prior work-up on 07/26/2016 revealed anemia of chronic disease.  Ensure no bleeding.  Guaiac all stools.  Left lower extremity DVT secondary to immobilization and malignancy.  Anticoagulation with heparin for now, but may need to be discontinued if platelets < 50,000.  Per nursing, plan for 2 units of PRBCs today.  Check CBC daily.  3.  Code status:  DNR.  Thank you for allowing me to participate in Bayyinah Dukeman 's care.  I will follow her closely with you while hospitalized and after discharge in the outpatient department.   Lequita Asal, MD  09/16/2016, 11:07 AM

## 2016-09-16 NOTE — Progress Notes (Signed)
ANTICOAGULATION CONSULT NOTE - Follow UP  Pharmacy Consult for heparin drip  Indication: DVT  Allergies  Allergen Reactions  . Other Swelling    Dust, ragweed  . Gabapentin Other (See Comments)    Jerking motions and confusion.  . Iodides   . Tetanus Toxoids Other (See Comments)    Localized pain and swelling  . Iodine Other (See Comments) and Rash    Limited history. Occurred in 81s, at age 65-12, received contrast at public health facility in [place], while being evaluated for meningitis. Immediate rash - not itchy, "flat, red, perfect circles" over entire body. Has not received iodine contrast since.     Vital Signs: Temp: 98.3 F (36.8 C) (12/31 0355) Temp Source: Oral (12/31 0355) BP: 114/79 (12/31 0355) Pulse Rate: 93 (12/31 0355)  Labs:  Recent Labs  09/15/16 1516 09/15/16 1517 09/16/16 0034 09/16/16 0757  HGB 7.6*  --  6.1*  --   HCT 22.7*  --  18.8*  --   PLT 95*  --  73*  --   APTT  --  24  --   --   LABPROT  --  13.1  --   --   INR  --  0.99  --   --   HEPARINUNFRC  --   --  0.13* 0.36  CREATININE 1.15*  --  1.12*  --     Estimated Creatinine Clearance: 48.1 mL/min (by C-G formula based on SCr of 1.12 mg/dL (H)).   Medical History: Past Medical History:  Diagnosis Date  . Anxiety   . Arthritis   . Asthma    mild  . Cancer (HCC)    urothelia  . Cervical disc disease   . COPD (chronic obstructive pulmonary disease) (HCC)    mild  . Depression   . Dysrhythmia    tachycardia  . Fibromyalgia   . GERD (gastroesophageal reflux disease)   . Hepatitis C   . History of hiatal hernia   . Intracerebral hemorrhage [facility])    Assessment: 65 yo female with left leg DVT. Pharmacy consulted for heparin dosing and monitoring.  12/30 1516: Plts:  95   Hgb: 7.6  Goal of Therapy:  Heparin level 0.3-0.7 units/ml Monitor platelets by anticoagulation protocol: Yes   Plan:  Baseline labs ordered Give 2800 units bolus x 1 Start heparin infusion at  900 units/hr Check anti-Xa level in 6 hours and daily while on heparin Continue to monitor H&H and platelets.    12/31 0030 heparin level 0.13. 1700 unit bolus and increase rate to 1100 units/hr. Recheck in 6 hours.  12/31 ~ 08:00 HL = 0.36.  Continue with current drip rate.  Will recheck HL today at 14:00.  Olivia Canter [facility] Clinical Pharmacist 09/16/2016 8:55 AM

## 2016-09-17 LAB — CBC
HCT: 26.5 % — ABNORMAL LOW (ref 35.0–47.0)
Hemoglobin: 9.2 g/dL — ABNORMAL LOW (ref 12.0–16.0)
MCH: 28.2 pg (ref 26.0–34.0)
MCHC: 34.9 g/dL (ref 32.0–36.0)
MCV: 81 fL (ref 80.0–100.0)
PLATELETS: 47 10*3/uL — AB (ref 150–440)
RBC: 3.27 MIL/uL — AB (ref 3.80–5.20)
RDW: 18.8 % — ABNORMAL HIGH (ref 11.5–14.5)
WBC: 7 10*3/uL (ref 3.6–11.0)

## 2016-09-17 LAB — BASIC METABOLIC PANEL
Anion gap: 8 (ref 5–15)
BUN: 14 mg/dL (ref 6–20)
CO2: 19 mmol/L — ABNORMAL LOW (ref 22–32)
CREATININE: 0.96 mg/dL (ref 0.44–1.00)
Calcium: 8.3 mg/dL — ABNORMAL LOW (ref 8.9–10.3)
Chloride: 105 mmol/L (ref 101–111)
GFR calc Af Amer: >60 mL/min (ref >60)
Glucose, Bld: 93 mg/dL (ref 65–99)
POTASSIUM: 3.8 mmol/L (ref 3.5–5.1)
Sodium: 132 mmol/L — ABNORMAL LOW (ref 135–145)

## 2016-09-17 LAB — TYPE AND SCREEN
Blood Product Expiration Date: 201801102359
Blood Product Expiration Date: 201801102359
ISSUE DATE / TIME: 201712311028
ISSUE DATE / TIME: 201712311352
UNIT TYPE AND RH: 600
Unit Type and Rh: 600

## 2016-09-17 LAB — PLATELET COUNT: Platelets: 66 10*3/uL — ABNORMAL LOW (ref 150–440)

## 2016-09-17 LAB — HEPARIN LEVEL (UNFRACTIONATED)
HEPARIN UNFRACTIONATED: 0.51 [IU]/mL (ref 0.30–0.70)
Heparin Unfractionated: 0.42 IU/mL (ref 0.30–0.70)

## 2016-09-17 MED ORDER — SODIUM CHLORIDE 0.9 % IV SOLN
Freq: Once | INTRAVENOUS | Status: AC
  Administered 2016-09-17: 11:00:00 via INTRAVENOUS

## 2016-09-17 MED ORDER — OXYCODONE-ACETAMINOPHEN 5-325 MG PO TABS
1.0000 | ORAL_TABLET | ORAL | Status: DC | PRN
  Administered 2016-09-17 – 2016-09-19 (×5): 2 via ORAL
  Filled 2016-09-17 (×4): qty 2
  Filled 2016-09-17 (×2): qty 1

## 2016-09-17 NOTE — Progress Notes (Signed)
ANTICOAGULATION CONSULT NOTE - Follow UP  Pharmacy Consult for heparin drip  Indication: DVT  Allergies  Allergen Reactions  . Other Swelling    Dust, ragweed  . Gabapentin Other (See Comments)    Jerking motions and confusion.  . Iodides   . Tetanus Toxoids Other (See Comments)    Localized pain and swelling  . Iodine Other (See Comments) and Rash    Limited history. Occurred in 28s, at age 64-12, received contrast at public health facility in [place], while being evaluated for meningitis. Immediate rash - not itchy, "flat, red, perfect circles" over entire body. Has not received iodine contrast since.   Vital Signs: Temp: 98 F (36.7 C) (01/01 1052) Temp Source: Oral (01/01 1052) BP: 104/55 (01/01 1052) Pulse Rate: 67 (01/01 1052)  Labs:  Recent Labs  09/15/16 1516 09/15/16 1517 09/16/16 0034  09/16/16 1750 09/16/16 1840 09/17/16 0236 09/17/16 0904  HGB 7.6*  --  6.1*  --  8.8*  --  9.2*  --   HCT 22.7*  --  18.8*  --  25.6*  --  26.5*  --   PLT 95*  --  73*  --   --   --  47*  --   APTT  --  24  --   --   --   --   --   --   LABPROT  --  13.1  --   --   --   --   --   --   INR  --  0.99  --   --   --   --   --   --   HEPARINUNFRC  --   --  0.10*  0.13*  < >  --  0.28* 0.51 0.42  CREATININE 1.15*  --  1.12*  --   --   --  0.96  --   < > = values in this interval not displayed.  Estimated Creatinine Clearance: 56.2 mL/min (by C-G formula based on SCr of 0.96 mg/dL).  Medical History: Past Medical History:  Diagnosis Date  . Anxiety   . Arthritis   . Asthma    mild  . Cancer (HCC)    urothelia  . Cervical disc disease   . COPD (chronic obstructive pulmonary disease) (HCC)    mild  . Depression   . Dysrhythmia    tachycardia  . Fibromyalgia   . GERD (gastroesophageal reflux disease)   . Hepatitis C   . History of hiatal hernia   . Intracerebral hemorrhage [facility])    Assessment: Pharmacy consulted to monitor heparin drip in this 66 year old  female for treatment of a DVT. 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  Goal of Therapy:  Heparin level 0.3-0.7 units/ml Monitor platelets by anticoagulation protocol: Yes   Plan:  1/1 0904 Confirmatory HL = 0.42 (therapeutic). Plt have decreased to 47,000 with AM labs this morning. Spoke with MD regarding Plt - patient to receive platelets and heparin drip to continue for now. Will continue heparin drip at current rate of 1250 units/hr.   Will recheck CBC and HL with AM labs tomorrow.   Lenis Noon, PharmD, BCPS Clinical Pharmacist 09/17/2016 12:00 PM

## 2016-09-17 NOTE — Plan of Care (Signed)
Problem: Safety: Goal: Ability to remain free from injury will improve Outcome: Not Progressing Patient is a high fall risk with a history of a fall within the last 6 months. Patient provided education pertaining to high fall risk and continues to refuse bed alarm. Patient instructed to call if assistance is needed. Pt verbalized understanding.   Problem: Bowel/Gastric: Goal: Will not experience complications related to bowel motility Outcome: Progressing Pt c/o constipation, requesting fleets enema. MD notified and order obtained. Enema completed with no results initially. Pt now reports that she has had 2 large bowel movements through the night.

## 2016-09-17 NOTE — Care Management (Signed)
Patient admitted with extensive DVT and is a current active chemo patient with [facility] cancer [facility].  She had a recent subarachnoid hemorrhage and after consulting with neurosurgeon it was determine patient could be placed on heparin drip as long as platelets remains > 50,000.  She has been bedridden due to her chemo treatments.  Anticipate IVC filter 1.2.2018 if she remains stable. Has required transfusion 1.1 due to hgb of 6.1. She does have DNR in place.  No issues accessing medical care or obtaining medications. There is no physical therapy order as it is medically contraindicated at present time

## 2016-09-17 NOTE — Progress Notes (Signed)
[place] [facility] [place] NAME: Sandra Landry    MR#:  [id number]  DATE OF BIRTH:  Oct 30, 1950  SUBJECTIVE:  CHIEF COMPLAINT:   Chief Complaint  Patient presents with  . Leg Swelling   -Hemoglobin improved with blood transfusion yesterday. -Platelets continue to drop today and so receiving transfusion as patient on heparin drip for DVT.  REVIEW OF SYSTEMS:  Review of Systems  Constitutional: Positive for malaise/fatigue. Negative for chills, fever and weight loss.  HENT: Negative for ear discharge, ear pain, hearing loss and nosebleeds.   Eyes: Negative for blurred vision, double vision and photophobia.  Respiratory: Negative for cough, hemoptysis, shortness of breath and wheezing.   Cardiovascular: Positive for leg swelling. Negative for chest pain, palpitations and orthopnea.  Gastrointestinal: Negative for abdominal pain, constipation, diarrhea, heartburn, melena, nausea and vomiting.  Genitourinary: Negative for dysuria and urgency.  Musculoskeletal: Negative for back pain and myalgias.  Skin: Negative for rash.  Neurological: Negative for dizziness, sensory change, speech change, focal weakness and headaches.  Endo/Heme/Allergies: Does not bruise/bleed easily.  Psychiatric/Behavioral: Negative for depression.    DRUG ALLERGIES:   Allergies  Allergen Reactions  . Other Swelling    Dust, ragweed  . Gabapentin Other (See Comments)    Jerking motions and confusion.  . Iodides   . Tetanus Toxoids Other (See Comments)    Localized pain and swelling  . Iodine Other (See Comments) and Rash    Limited history. Occurred in 86s, at age 11-12, received contrast at public health facility in [place], while being evaluated for meningitis. Immediate rash - not itchy, "flat, red, perfect circles" over entire body. Has not received iodine contrast since.    VITALS:  Blood pressure (!) 92/54, pulse 72, temperature 98.6 F (37 C),  temperature source Oral, resp. rate 18, height 5\' 6"  (1.676 m), weight 64.5 kg (142 lb 1.6 oz), SpO2 100 %.  PHYSICAL EXAMINATION:  Physical Exam  GENERAL:  66 y.o.-year-old patient lying in the bed with no acute distress.  EYES: Pupils equal, round, reactive to light and accommodation. No scleral icterus. Extraocular muscles intact.  HEENT: Head atraumatic, normocephalic. Oropharynx and nasopharynx clear.  NECK:  Supple, no jugular venous distention. No thyroid enlargement, no tenderness.  LUNGS: Normal breath sounds bilaterally, no wheezing, rales,rhonchi or crepitation. No use of accessory muscles of respiration.  CARDIOVASCULAR: S1, S2 normal. No murmurs, rubs, or gallops.  ABDOMEN: Soft, nontender, nondistended. Bowel sounds present. No organomegaly or mass.  EXTREMITIES: 2+ left leg edema, no erythema noted. No cyanosis, or clubbing.  NEUROLOGIC: Cranial nerves II through XII are intact. Muscle strength 5/5 in all extremities. Sensation intact. Gait not checked.  PSYCHIATRIC: The patient is alert and oriented x 3.  SKIN: No obvious rash, lesion, or ulcer.    LABORATORY PANEL:   CBC  Recent Labs Lab 09/17/16 0236  WBC 7.0  HGB 9.2*  HCT 26.5*  PLT 47*   ------------------------------------------------------------------------------------------------------------------  Chemistries   Recent Labs Lab 09/11/16 1100  09/17/16 0236  NA 130*  < > 132*  K 3.3*  < > 3.8  CL 101  < > 105  CO2 19*  < > 19*  GLUCOSE 120*  < > 93  BUN 29*  < > 14  CREATININE 1.30*  < > 0.96  CALCIUM 8.7*  < > 8.3*  AST 23  --   --   ALT 13*  --   --   ALKPHOS 55  --   --  BILITOT 0.7  --   --   < > = values in this interval not displayed. ------------------------------------------------------------------------------------------------------------------  Cardiac Enzymes No results for input(s): TROPONINI in the last 168  hours. ------------------------------------------------------------------------------------------------------------------  RADIOLOGY:  US Venous Img Lower Unilateral Left  Result Date: 09/15/2016 CLINICAL DATA:  Left lower extremity pain and edema. History of urothelial carcinoma. EXAM: LEFT LOWER EXTREMITY VENOUS DOPPLER ULTRASOUND TECHNIQUE: Gray-scale sonography with graded compression, as well as color Doppler and duplex ultrasound were performed to evaluate the lower extremity deep venous systems from the level of the common femoral vein and including the common femoral, femoral, profunda femoral, popliteal and calf veins including the posterior tibial, peroneal and gastrocnemius veins when visible. The superficial great saphenous vein was also interrogated. Spectral Doppler was utilized to evaluate flow at rest and with distal augmentation maneuvers in the common femoral, femoral and popliteal veins. COMPARISON:  None. FINDINGS: Contralateral Common Femoral Vein: Respiratory phasicity is normal and symmetric with the symptomatic side. No evidence of thrombus. Normal compressibility. Common Femoral Vein: Occlusive thrombus identified. Saphenofemoral Junction: Occlusive thrombus identified. Profunda Femoral Vein: Occlusive thrombus identified. Femoral Vein: Occlusive thrombus identified. Popliteal Vein: Occlusive thrombus identified. Calf Veins: Occlusive thrombus identified. Superficial Great Saphenous Vein: Not fully assessed. Venous Reflux:  None. Other Findings:  No abnormal fluid collections. IMPRESSION: Extensive left lower extremity DVT involving the entire visualized deep veins from the level of the common femoral vein to the calf veins. Electronically Signed   By: Aletta Edouard M.D.   On: 09/15/2016 16:14    EKG:   Orders placed or performed during the hospital encounter of 08/08/16  . ED EKG  . 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  #1 left lower extremity DVT-currently on anticoagulation with heparin drip. -History of subarachnoid hemorrhage 2 months ago, but according to neurosurgeon, can be anticoagulated. Currently with IV heparin.. -risk with long-term anticoagulation would be dropping blood counts and platelets.  -Plan is for IVC filter tomorrow No plans to discharge on oral anticoagulation at this time until platelets can be recovered. To be addressed by hematology.  #2 acute on chronic anemia-secondary to chemotherapy and also on heparin drip. -No active bleeding noted. Received 2 units prbc yesterday and hb stable at 9 today- f/u in AM  #3 Acute on chronic thrombocytopenia- secondary to chemotherapy dropping platelets as outpatient requiring transfusion. Also currently on heparin drip. Due to recent subarachnoid hemorrhage, plan is to keep the platelets greater than 50 K. -1 unit platelets ordered today. Check 1 hour posttransfusion platelet count. -Continue to transfuse tomorrow if needed. No plans to discharge her on oral anticoagulation until platelets can be stabilized as outpatient. To be addressed by colicky.  #3 stage IV urothelial cancer-hematology consult requested. Patient is currently on chemotherapy-received 2 cycles so far.  #4 COPD-stable.  #5 hypokalemia-replaced  #6 DVT prophylaxis-currently on heparin drip   Discussed with oncologist and patient's partner at bedside  All the records are reviewed and case discussed with Care Management/Social Workerr. Management plans discussed with the patient, family and they are in agreement.  CODE STATUS: DO NOT RESUSCITATE  TOTAL TIME TAKING CARE OF THIS PATIENT: 37 minutes.   POSSIBLE D/C IN 1-2 DAYS, DEPENDING ON CLINICAL CONDITION.   Gladstone Lighter M.D on 09/17/2016 at 1:29 PM  Between 7am to 6pm - Pager -  [phone]  After 6pm go to [email or web address] - Proofreader  [facility]  Office  [phone]  CC: Primary care physician; BLISS, Lynnell Jude, MD

## 2016-09-17 NOTE — Progress Notes (Signed)
ANTICOAGULATION CONSULT NOTE - Follow UP  Pharmacy Consult for heparin drip  Indication: DVT  Allergies  Allergen Reactions  . Other Swelling    Dust, ragweed  . Gabapentin Other (See Comments)    Jerking motions and confusion.  . Iodides   . Tetanus Toxoids Other (See Comments)    Localized pain and swelling  . Iodine Other (See Comments) and Rash    Limited history. Occurred in 52s, at age 64-12, received contrast at public health facility in [place], while being evaluated for meningitis. Immediate rash - not itchy, "flat, red, perfect circles" over entire body. Has not received iodine contrast since.     Vital Signs: Temp: 98.5 F (36.9 C) (01/01 0403) Temp Source: Oral (01/01 0403) BP: 113/70 (01/01 0403) Pulse Rate: 81 (01/01 0403)  Labs:  Recent Labs  09/15/16 1516 09/15/16 1517  09/16/16 0034 09/16/16 0757 09/16/16 1750 09/16/16 1840 09/17/16 0236  HGB 7.6*  --   --  6.1*  --  8.8*  --  9.2*  HCT 22.7*  --   --  18.8*  --  25.6*  --  26.5*  PLT 95*  --   --  73*  --   --   --  47*  APTT  --  24  --   --   --   --   --   --   LABPROT  --  13.1  --   --   --   --   --   --   INR  --  0.99  --   --   --   --   --   --   HEPARINUNFRC  --   --   < > 0.10*  0.13* 0.36  --  0.28* 0.51  CREATININE 1.15*  --   --  1.12*  --   --   --  0.96  < > = values in this interval not displayed.  Estimated Creatinine Clearance: 56.2 mL/min (by C-G formula based on SCr of 0.96 mg/dL).   Medical History: Past Medical History:  Diagnosis Date  . Anxiety   . Arthritis   . Asthma    mild  . Cancer (HCC)    urothelia  . Cervical disc disease   . COPD (chronic obstructive pulmonary disease) (HCC)    mild  . Depression   . Dysrhythmia    tachycardia  . Fibromyalgia   . GERD (gastroesophageal reflux disease)   . Hepatitis C   . History of hiatal hernia   . Intracerebral hemorrhage [facility])    Assessment: 66 yo female with left leg DVT. Pharmacy consulted for  heparin dosing and monitoring.  12/30 1516: Plts:  95   Hgb: 7.6  Goal of Therapy:  Heparin level 0.3-0.7 units/ml Monitor platelets by anticoagulation protocol: Yes   Plan:  Baseline labs ordered Give 2800 units bolus x 1 Start heparin infusion at 900 units/hr Check anti-Xa level in 6 hours and daily while on heparin Continue to monitor H&H and platelets.    12/31 0030 heparin level 0.13. 1700 unit bolus and increase rate to 1100 units/hr. Recheck in 6 hours.  12/31 ~ 08:00 HL = 0.36.  Continue with current drip rate.  Will recheck HL today at 14:00.  12/31 1840 HL =0.28  Will increase infusion to 1250 units/hr. Recheck HL in 6 hours.   1/1 0230 heparin level 0.51. Continue current regimen. Recheck in 6 hours to confirm.   Abbe Bula S,  [facility] Clinical Pharmacist 09/17/2016 4:30 AM

## 2016-09-17 NOTE — Plan of Care (Signed)
Problem: Education: Goal: Knowledge of [facility] Education information/materials will improve Outcome: Progressing Dr Mike Gip and Tressia Miners and  Vascular  In to see pt today  Problem: Pain Managment: Goal: General experience of comfort will improve Outcome: Progressing Percocet increased to 1-2  tabs  Problem: Tissue Perfusion: Goal: Risk factors for ineffective tissue perfusion will decrease Outcome: Progressing Platelets today  Problem: Fluid Volume: Goal: Ability to maintain a balanced intake and output will improve Outcome: Progressing Hep drip cont  Problem: Nutrition: Goal: Adequate nutrition will be maintained Outcome: Progressing fair  Problem: Bowel/Gastric: Goal: Will not experience complications related to bowel motility Outcome: Progressing bm this am  Lax and stool softners on board

## 2016-09-17 NOTE — Progress Notes (Signed)
[facility] Hematology/Oncology Progress Note  Date of admission: 09/15/2016  Hospital day:  09/17/2016  Chief Complaint: Sandra Landry is a 66 y.o. 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  Subjective:  Feeling "ok".  Fatigue.  No bruising or bleeding.  Social History: The patient is accompanied by Sandra Landry and her nurse today.  Allergies:  Allergies  Allergen Reactions  . Other Swelling    Dust, ragweed  . Gabapentin Other (See Comments)    Jerking motions and confusion.  . Iodides   . Tetanus Toxoids Other (See Comments)    Localized pain and swelling  . Iodine Other (See Comments) and Rash    Limited history. Occurred in 58s, at age 2-12, received contrast at public [facility] in [place], while being evaluated for meningitis. Immediate rash - not itchy, "flat, red, perfect circles" over entire body. Has not received iodine contrast since.    Scheduled Medications: . diltiazem  240 mg Oral QHS  . docusate sodium  100 mg Oral BID  . escitalopram  40 mg Oral QHS  . famotidine  20 mg Oral BID  . lactulose  10 g Oral BID  . megestrol  200 mg Oral Daily  . morphine  15 mg Oral BID  . potassium chloride SA  20 mEq Oral Daily  . sodium chloride flush  3 mL Intravenous Q12H  . sodium chloride flush  3 mL Intravenous Q12H    Review of Systems: GENERAL: Fatigue. No fever or sweats. Weight gain of 12 pounds since last week (fluid weight). PERFORMANCE STATUS (ECOG): 3 HEENT: Decreased vision in left eye since fall (diplopia when eye open). No runny nose, sore throat, mouth sores or tenderness. Lungs: No increased shortness of breath.  No cough. No hemoptysis. Cardiac: No chest pain, palpitations, orthopnea, or PND. GI: Eating better.  Constipation, resolved.  Chronic nausea.  No diarrhea, melena or hematochezia. GU:  Right urostomy tube. No urgency, frequency, or dysuria. Musculoskeletal: Right sided back pain. No joint pain. No muscle tenderness. Extremities: Left leg swelling, improved. Skin: No rashes or skin changes. Neuro: No falls.  No numbness or weakness, or coordination issues. Endocrine: No diabetes, thyroid issues, hot flashes or night sweats. Psych: Hypersensitive. PTSD. Depression. Anxiety. Pain: Low back/pelvic pain. Review of systems: All other systems reviewed and found to be negative.  Physical Exam: Blood pressure (!) 108/59, pulse 71, temperature 98.2 F (36.8 C), temperature source Oral, resp. rate 18, height 5' 6"  (1.676 m), weight 142 lb 1.6 oz (64.5 kg), SpO2 100 %.  GENERAL:Thin, fatigued appearing woman lying in bed on the medical unit in no acute distress.  MENTAL STATUS: Alert and oriented to person, place and time. HEAD:Short brown hair. Normocephalic, atraumatic, face symmetric, no Cushingoid features. EYES:Pupils equal round and reactive to light and accomodation. No conjunctivitis or scleral icterus.  WLS:[id number] clear without lesion. No upper teeth. Tonguenormal. Mucous membranes moist. RESPIRATORY:Clear to auscultationwithout rales, wheezes or rhonchi. CARDIOVASCULAR:Regular rate andrhythmwithout murmur, rub or gallop. ABDOMEN:Soft, nontender with active bowel sounds and no hepatosplenomegaly.  SKIN: No rashes, ulcers or lesions. EXTREMITIES: 2+ left lower extremity edema, improved.  No skin discoloration or tenderness.  NEUROLOGICAL: Appropriate. PSYCH: Appropriate.   Results for orders placed or performed during the hospital encounter of 09/15/16 (from the past 48 hour(s))  CBC     Status: Abnormal   Collection Time: 09/15/16  3:16 PM  Result Value Ref Range   WBC 16.0 (H) 3.6 - 11.0 K/uL   RBC 2.70 (L) 3.80 - 5.20 MIL/uL   Hemoglobin 7.6 (L) 12.0 - 16.0 g/dL   HCT 22.7 (L) 35.0 - 47.0 %   MCV 83.9 80.0 - 100.0 fL    MCH 28.0 26.0 - 34.0 pg   MCHC 33.3 32.0 - 36.0 g/dL   RDW 21.8 (H) 11.5 - 14.5 %   Platelets 95 (L) 150 - 440 K/uL  Basic metabolic panel     Status: Abnormal   Collection Time: 09/15/16  3:16 PM  Result Value Ref Range   Sodium 134 (L) 135 - 145 mmol/L   Potassium 4.3 3.5 - 5.1 mmol/L   Chloride 105 101 - 111 mmol/L   CO2 21 (L) 22 - 32 mmol/L   Glucose, Bld 128 (H) 65 - 99 mg/dL   BUN 22 (H) 6 - 20 mg/dL   Creatinine, Ser 1.15 (H) 0.44 - 1.00 mg/dL   Calcium 8.4 (L) 8.9 - 10.3 mg/dL   GFR calc non Af Amer 50 (L) >60 mL/min   GFR calc Af Amer 57 (L) >60 mL/min    Comment: (NOTE) The eGFR has been calculated using the CKD EPI equation. This calculation has not been validated in all clinical situations. eGFR's persistently <60 mL/min signify possible Chronic Kidney Disease.    Anion gap 8 5 - 15  Protime-INR     Status: None   Collection Time: 09/15/16  3:17 PM  Result Value Ref Range   Prothrombin Time 13.1 11.4 - 15.2 seconds   INR 0.99   APTT     Status: None   Collection Time: 09/15/16  3:17 PM  Result Value Ref Range   aPTT 24 24 - 36 seconds  Type and screen     Status: None   Collection Time: 09/15/16  7:02 PM  Result Value Ref Range   ISSUE DATE / TIME [id number]    Blood Product Unit Number [id number]    PRODUCT CODE D1761Y07    Unit Type and Rh 0600    Blood Product Expiration Date [id number]    ISSUE DATE / TIME [id number]    Blood Product Unit Number [id number]    PRODUCT CODE F8101B51    Unit Type and Rh 0600    Blood Product Expiration Date [id number]   CBC     Status: Abnormal   Collection Time: 09/16/16 12:34 AM  Result Value Ref Range   WBC 11.8 (H) 3.6 - 11.0 K/uL   RBC 2.26 (L) 3.80 - 5.20 MIL/uL   Hemoglobin 6.1 (L) 12.0 - 16.0 g/dL   HCT 18.8 (L) 35.0 - 47.0 %   MCV 83.1 80.0 - 100.0 fL   MCH 27.2 26.0 - 34.0 pg   MCHC 32.7 32.0 - 36.0 g/dL   RDW 21.4 (H) 11.5 - 14.5 %   Platelets 73 (L) 150 - 440 K/uL  Basic metabolic  panel     Status: Abnormal   Collection Time: 09/16/16 12:34 AM  Result Value Ref Range   Sodium 133 (L) 135 - 145 mmol/L   Potassium 3.4 (L) 3.5 - 5.1 mmol/L   Chloride 106 101 - 111 mmol/L   CO2 21 (L) 22 - 32 mmol/L   Glucose, Bld 97 65 - 99 mg/dL   BUN 21 (H) 6 - 20 mg/dL   Creatinine, Ser 1.12 (H) 0.44 - 1.00 mg/dL   Calcium 8.0 (L) 8.9 - 10.3  mg/dL   GFR calc non Af Amer 51 (L) >60 mL/min   GFR calc Af Amer 59 (L) >60 mL/min    Comment: (NOTE) The eGFR has been calculated using the CKD EPI equation. This calculation has not been validated in all clinical situations. eGFR's persistently <60 mL/min signify possible Chronic Kidney Disease.    Anion gap 6 5 - 15  Heparin level (unfractionated)     Status: Abnormal   Collection Time: 09/16/16 12:34 AM  Result Value Ref Range   Heparin Unfractionated 0.13 (L) 0.30 - 0.70 IU/mL    Comment:        IF HEPARIN RESULTS ARE BELOW EXPECTED VALUES, AND PATIENT DOSAGE HAS BEEN CONFIRMED, SUGGEST FOLLOW UP TESTING OF ANTITHROMBIN III LEVELS.   Heparin level (unfractionated)     Status: Abnormal   Collection Time: 09/16/16 12:34 AM  Result Value Ref Range   Heparin Unfractionated 0.10 (L) 0.30 - 0.70 IU/mL    Comment:        IF HEPARIN RESULTS ARE BELOW EXPECTED VALUES, AND PATIENT DOSAGE HAS BEEN CONFIRMED, SUGGEST FOLLOW UP TESTING OF ANTITHROMBIN III LEVELS.   Prepare RBC     Status: None   Collection Time: 09/16/16  7:30 AM  Result Value Ref Range   Order Confirmation ORDER PROCESSED BY BLOOD BANK   Heparin level (unfractionated)     Status: None   Collection Time: 09/16/16  7:57 AM  Result Value Ref Range   Heparin Unfractionated 0.36 0.30 - 0.70 IU/mL    Comment:        IF HEPARIN RESULTS ARE BELOW EXPECTED VALUES, AND PATIENT DOSAGE HAS BEEN CONFIRMED, SUGGEST FOLLOW UP TESTING OF ANTITHROMBIN III LEVELS.   Hemoglobin and hematocrit, blood     Status: Abnormal   Collection Time: 09/16/16  5:50 PM  Result Value  Ref Range   Hemoglobin 8.8 (L) 12.0 - 16.0 g/dL   HCT 25.6 (L) 35.0 - 47.0 %  Heparin level (unfractionated)     Status: Abnormal   Collection Time: 09/16/16  6:40 PM  Result Value Ref Range   Heparin Unfractionated 0.28 (L) 0.30 - 0.70 IU/mL    Comment:        IF HEPARIN RESULTS ARE BELOW EXPECTED VALUES, AND PATIENT DOSAGE HAS BEEN CONFIRMED, SUGGEST FOLLOW UP TESTING OF ANTITHROMBIN III LEVELS.   CBC     Status: Abnormal   Collection Time: 09/17/16  2:36 AM  Result Value Ref Range   WBC 7.0 3.6 - 11.0 K/uL   RBC 3.27 (L) 3.80 - 5.20 MIL/uL   Hemoglobin 9.2 (L) 12.0 - 16.0 g/dL   HCT 26.5 (L) 35.0 - 47.0 %   MCV 81.0 80.0 - 100.0 fL   MCH 28.2 26.0 - 34.0 pg   MCHC 34.9 32.0 - 36.0 g/dL   RDW 18.8 (H) 11.5 - 14.5 %   Platelets 47 (L) 150 - 440 K/uL  Basic metabolic panel     Status: Abnormal   Collection Time: 09/17/16  2:36 AM  Result Value Ref Range   Sodium 132 (L) 135 - 145 mmol/L   Potassium 3.8 3.5 - 5.1 mmol/L   Chloride 105 101 - 111 mmol/L   CO2 19 (L) 22 - 32 mmol/L   Glucose, Bld 93 65 - 99 mg/dL   BUN 14 6 - 20 mg/dL   Creatinine, Ser 0.96 0.44 - 1.00 mg/dL   Calcium 8.3 (L) 8.9 - 10.3 mg/dL   GFR calc non Af Amer >60 >60  mL/min   GFR calc Af Amer >60 >60 mL/min    Comment: (NOTE) The eGFR has been calculated using the CKD EPI equation. This calculation has not been validated in all clinical situations. eGFR's persistently <60 mL/min signify possible Chronic Kidney Disease.    Anion gap 8 5 - 15  Heparin level (unfractionated)     Status: None   Collection Time: 09/17/16  2:36 AM  Result Value Ref Range   Heparin Unfractionated 0.51 0.30 - 0.70 IU/mL    Comment:        IF HEPARIN RESULTS ARE BELOW EXPECTED VALUES, AND PATIENT DOSAGE HAS BEEN CONFIRMED, SUGGEST FOLLOW UP TESTING OF ANTITHROMBIN III LEVELS.   Prepare Pheresed Platelets     Status: None (Preliminary result)   Collection Time: 09/17/16  7:50 AM  Result Value Ref Range   Unit  Number [id number]    Blood Component Type PLTP LR2 PAS    Unit division 00    Status of Unit ISSUED    Transfusion Status OK TO TRANSFUSE   Heparin level (unfractionated)     Status: None   Collection Time: 09/17/16  9:04 AM  Result Value Ref Range   Heparin Unfractionated 0.42 0.30 - 0.70 IU/mL    Comment:        IF HEPARIN RESULTS ARE BELOW EXPECTED VALUES, AND PATIENT DOSAGE HAS BEEN CONFIRMED, SUGGEST FOLLOW UP TESTING OF ANTITHROMBIN III LEVELS.    US Venous Img Lower Unilateral Left  Result Date: 09/15/2016 CLINICAL DATA:  Left lower extremity pain and edema. History of urothelial carcinoma. EXAM: LEFT LOWER EXTREMITY VENOUS DOPPLER ULTRASOUND TECHNIQUE: Gray-scale sonography with graded compression, as well as color Doppler and duplex ultrasound were performed to evaluate the lower extremity deep venous systems from the level of the common femoral vein and including the common femoral, femoral, profunda femoral, popliteal and calf veins including the posterior tibial, peroneal and gastrocnemius veins when visible. The superficial great saphenous vein was also interrogated. Spectral Doppler was utilized to evaluate flow at rest and with distal augmentation maneuvers in the common femoral, femoral and popliteal veins. COMPARISON:  None. FINDINGS: Contralateral Common Femoral Vein: Respiratory phasicity is normal and symmetric with the symptomatic side. No evidence of thrombus. Normal compressibility. Common Femoral Vein: Occlusive thrombus identified. Saphenofemoral Junction: Occlusive thrombus identified. Profunda Femoral Vein: Occlusive thrombus identified. Femoral Vein: Occlusive thrombus identified. Popliteal Vein: Occlusive thrombus identified. Calf Veins: Occlusive thrombus identified. Superficial Great Saphenous Vein: Not fully assessed. Venous Reflux:  None. Other Findings:  No abnormal fluid collections. IMPRESSION: Extensive left lower extremity DVT involving the entire  visualized deep veins from the level of the common femoral vein to the calf veins. Electronically Signed   By: Aletta Edouard M.D.   On: 09/15/2016 16:14    Assessment:  Latoia Eyster is a 66 y.o. female with with locally advanced right ureteral carcinoma currently day 14 s/p cycle #2 cisplatin and gemcitabine who was admitted with an extensive left lower extremity DVT.  She was admitted to [facility] on 08/08/2016 following a fall. Head MRI revealed a subarachnoid hemorrhage. She had atrial fibrillation with RVR.  Platelets were maintained > 50,000.  She received 2 units of PRBCs and 1 unit of platelets.  Chest, abdomen, and pelvic CTscan on 08/08/2016 revealed a mild age-indeterminate compression fracture of T6. There was a 6 mm hypodensity at the dome of the liver too small to characterize and a few additional small subcentimeter hypodense scattered lesions in the liver. There was a  7.3 x 6.4 x 7.7 cm right pelvic mass invading the sacrum and causing a lytic lesion with pathologic fracture. The mass likely invaded the right psoas muscle.  She has anemia and thrombocytopenia.  Hematocrit improved from 18.8 to 26.5 after 2 units of PRBCs.  Platelet count drifting down from  95,000 to 73,000 to 47,000 today.   Platelet transfusion in progress.  She denies any bleeding.  Plan:   1.  Oncology:  Day 14 of cycle #2 cisplatin and gemcitabine.  Although gemcitabine decreased by 20% with cycle #2, suspect platelet count will decrease further.  I believe platelet count decreased to 20,000 with cycle #1 (she was at [facility]) and she received a platelet transfusion to maintain platelets > 50,000 s/p subarachnoid hemorrhage.    Patent can be anticoagulated with heparin as long as platelet count > 50,000.  Predicted platelet count of 30,000 tomorrow morning based on platelet count drop overnight.  Patient currently receiving a platelet transfusion. Check 1 hour post platelet count.  Vascular surgery planning on  IVC filter tomorrow.  Discussed with patient and her partner.  Risks and benefits of filter discussed.  No plans to switch to any oral anticoagulation given dropping platelet count.  Patient may need to be off anticoagulation in the outpatient department once filter placed until platelet count recovers.  Discussed use of Lovenox in the outpatient department.  Re-reviewed plan with patient, plan for restaging CT scans after recovery from this cycle.  Patient interested in palliative radiation followed by Tecentriq.  Initial Tecentriq study with 119 patient reviewed.  Response rate approximately 25% with 10% CR.  Median overall survival 16 months.  In the expanded phase I study, 15% response rate with 84% prolonged responses (> 75month).  Discussed side effects including fatigue and autoimmune reactions.  Patient is not a candidate for debulking surgery.  2.  Hematology:  Anemia and thrombocytopenia secondary to chemotherapy (see above).  Prior work-up on 07/26/2016 revealed anemia of chronic disease.  No evidence of bleeding.  Guaiac all stools.  Left lower extremity DVT secondary to immobilization and malignancy.  Anticoagulation ongoing with heparin for now (as long as platelets > 50,000).  Platelet count was 47,000 this morning.  Platelet transfusion in progress with plan for 1 hour post platelet count.  Hematocrit increased after 2 units of PRBCs yesterday.  Check CBC daily.  3.  Code status:  DNR.  4.  Disposition:  Anticipate discharge home on 09/19/2016.   MLequita Asal MD  09/17/2016, 12:20 PM

## 2016-09-17 NOTE — Progress Notes (Signed)
    Subjective  -   No acute events overnight   Physical Exam:  Left leg edema Non labored breathing RRR   Plts down to 47,000, receiving transfusion    Assessment/Plan:    Will plan for IVC filter tomorrow Procedure discussed with patient.  All questions answered  Sandra Landry 09/17/2016 12:43 PM --  Vitals:   09/17/16 1052 09/17/16 1125  BP: (!) 104/55 (!) 108/59  Pulse: 67 71  Resp: 18 18  Temp: 98 F (36.7 C) 98.2 F (36.8 C)    Intake/Output Summary (Last 24 hours) at 09/17/16 1243 Last data filed at 09/17/16 1110  Gross per 24 hour  Intake            573.1 ml  Output              350 ml  Net            223.1 ml     Laboratory CBC    Component Value Date/Time   WBC 7.0 09/17/2016 0236   HGB 9.2 (L) 09/17/2016 0236   HCT 26.5 (L) 09/17/2016 0236   PLT 47 (L) 09/17/2016 0236    BMET    Component Value Date/Time   NA 132 (L) 09/17/2016 0236   K 3.8 09/17/2016 0236   CL 105 09/17/2016 0236   CO2 19 (L) 09/17/2016 0236   GLUCOSE 93 09/17/2016 0236   BUN 14 09/17/2016 0236   CREATININE 0.96 09/17/2016 0236   CALCIUM 8.3 (L) 09/17/2016 0236   GFRNONAA >60 09/17/2016 0236   GFRAA >60 09/17/2016 0236    COAG Lab Results  Component Value Date   INR 0.99 09/15/2016   INR 0.98 07/11/2016   No results found for: PTT  Antibiotics Anti-infectives    None       V. Leia Alf, M.D. [facility] Office: [phone] Pager:  [phone]

## 2016-09-18 ENCOUNTER — Encounter
Admission: EM | Disposition: A | Discharge status: Home / Self Care | Payer: Self-pay | Source: Home / Self Care | Attending: Internal Medicine

## 2016-09-18 ENCOUNTER — Institutional Professional Consult (permissible substitution): Payer: 59 | Admitting: Radiation Oncology

## 2016-09-18 DIAGNOSIS — I2699 Other pulmonary embolism without acute cor pulmonale: Secondary | ICD-10-CM

## 2016-09-18 DIAGNOSIS — F4310 Post-traumatic stress disorder, unspecified: Secondary | ICD-10-CM

## 2016-09-18 DIAGNOSIS — D649 Anemia, unspecified: Secondary | ICD-10-CM

## 2016-09-18 DIAGNOSIS — R110 Nausea: Secondary | ICD-10-CM

## 2016-09-18 DIAGNOSIS — I82409 Acute embolism and thrombosis of unspecified deep veins of unspecified lower extremity: Secondary | ICD-10-CM

## 2016-09-18 DIAGNOSIS — M549 Dorsalgia, unspecified: Secondary | ICD-10-CM

## 2016-09-18 DIAGNOSIS — R635 Abnormal weight gain: Secondary | ICD-10-CM

## 2016-09-18 HISTORY — PX: PERIPHERAL VASCULAR CATHETERIZATION: SHX172C

## 2016-09-18 LAB — CBC
HEMATOCRIT: 25.5 % — AB (ref 35.0–47.0)
HEMOGLOBIN: 8.9 g/dL — AB (ref 12.0–16.0)
MCH: 28.3 pg (ref 26.0–34.0)
MCHC: 34.7 g/dL (ref 32.0–36.0)
MCV: 81.5 fL (ref 80.0–100.0)
Platelets: 56 10*3/uL — ABNORMAL LOW (ref 150–440)
RBC: 3.13 MIL/uL — AB (ref 3.80–5.20)
RDW: 18.7 % — ABNORMAL HIGH (ref 11.5–14.5)
WBC: 6.6 10*3/uL (ref 3.6–11.0)

## 2016-09-18 LAB — HEPARIN LEVEL (UNFRACTIONATED): Heparin Unfractionated: 0.35 IU/mL (ref 0.30–0.70)

## 2016-09-18 LAB — PROTIME-INR
INR: 1.05
Prothrombin Time: 13.7 seconds (ref 11.4–15.2)

## 2016-09-18 LAB — PREPARE PLATELET PHERESIS: Unit division: 0

## 2016-09-18 LAB — BASIC METABOLIC PANEL
ANION GAP: 4 — AB (ref 5–15)
BUN: 13 mg/dL (ref 6–20)
CO2: 22 mmol/L (ref 22–32)
Calcium: 8.6 mg/dL — ABNORMAL LOW (ref 8.9–10.3)
Chloride: 105 mmol/L (ref 101–111)
Creatinine, Ser: 1.14 mg/dL — ABNORMAL HIGH (ref 0.44–1.00)
GFR calc Af Amer: 58 mL/min — ABNORMAL LOW (ref >60)
GFR, EST NON AFRICAN AMERICAN: 50 mL/min — AB (ref >60)
GLUCOSE: 90 mg/dL (ref 65–99)
POTASSIUM: 4.4 mmol/L (ref 3.5–5.1)
Sodium: 131 mmol/L — ABNORMAL LOW (ref 135–145)

## 2016-09-18 SURGERY — IVC FILTER INSERTION
Anesthesia: Moderate Sedation

## 2016-09-18 MED ORDER — METHYLPREDNISOLONE SODIUM SUCC 125 MG IJ SOLR
INTRAMUSCULAR | Status: AC
  Filled 2016-09-18: qty 2

## 2016-09-18 MED ORDER — CEFAZOLIN IN D5W 1 GM/50ML IV SOLN
1.0000 g | Freq: Once | INTRAVENOUS | Status: AC
  Administered 2016-09-18: 1 g via INTRAVENOUS

## 2016-09-18 MED ORDER — MIDAZOLAM HCL 2 MG/2ML IJ SOLN
INTRAMUSCULAR | Status: DC | PRN
  Administered 2016-09-18: 1 mg via INTRAVENOUS

## 2016-09-18 MED ORDER — METHYLPREDNISOLONE SODIUM SUCC 125 MG IJ SOLR
125.0000 mg | Freq: Once | INTRAMUSCULAR | Status: AC
  Administered 2016-09-18: 125 mg via INTRAVENOUS

## 2016-09-18 MED ORDER — LIDOCAINE HCL (PF) 1 % IJ SOLN
INTRAMUSCULAR | Status: AC
  Filled 2016-09-18: qty 5

## 2016-09-18 MED ORDER — FENTANYL CITRATE (PF) 100 MCG/2ML IJ SOLN
INTRAMUSCULAR | Status: AC
  Filled 2016-09-18: qty 2

## 2016-09-18 MED ORDER — HEPARIN (PORCINE) IN NACL 2-0.9 UNIT/ML-% IJ SOLN
INTRAMUSCULAR | Status: AC
  Filled 2016-09-18: qty 500

## 2016-09-18 MED ORDER — DIPHENHYDRAMINE HCL 50 MG/ML IJ SOLN
INTRAMUSCULAR | Status: AC
  Filled 2016-09-18: qty 1

## 2016-09-18 MED ORDER — MIDAZOLAM HCL 2 MG/2ML IJ SOLN
INTRAMUSCULAR | Status: AC
  Filled 2016-09-18: qty 2

## 2016-09-18 MED ORDER — DIPHENHYDRAMINE HCL 50 MG/ML IJ SOLN
INTRAMUSCULAR | Status: DC | PRN
  Administered 2016-09-18: 25 mg via INTRAVENOUS

## 2016-09-18 MED ORDER — MORPHINE SULFATE ER 30 MG PO TBCR
30.0000 mg | EXTENDED_RELEASE_TABLET | Freq: Two times a day (BID) | ORAL | Status: DC
  Administered 2016-09-18 – 2016-09-20 (×4): 30 mg via ORAL
  Filled 2016-09-18 (×4): qty 1

## 2016-09-18 MED ORDER — CHLORHEXIDINE GLUCONATE CLOTH 2 % EX PADS
6.0000 | MEDICATED_PAD | Freq: Every day | CUTANEOUS | Status: DC
  Administered 2016-09-18: 07:00:00 6 via TOPICAL

## 2016-09-18 MED ORDER — HEPARIN SODIUM (PORCINE) 1000 UNIT/ML IJ SOLN
INTRAMUSCULAR | Status: AC
  Filled 2016-09-18: qty 1

## 2016-09-18 SURGICAL SUPPLY — 3 items
KIT FEMORAL DEL DENALI (Miscellaneous) ×3 IMPLANT
PACK ANGIOGRAPHY (CUSTOM PROCEDURE TRAY) ×3 IMPLANT
WIRE J 3MM .035X145CM (WIRE) ×3 IMPLANT

## 2016-09-18 NOTE — Consult Note (Signed)
NEW PATIENT EVALUATION  Name: Sandra Landry  MRN: [id number]  Date:   09/15/2016     DOB: 02/21/1951   This 66 y.o. 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  REFERRING PHYSICIAN: No ref. provider found  CHIEF COMPLAINT:  Chief Complaint  Patient presents with  . Leg Swelling    DIAGNOSIS: The primary encounter diagnosis was Acute deep vein thrombosis (DVT) of distal vein of left lower extremity ([place]). A diagnosis of Swelling was also pertinent to this visit.   PREVIOUS INVESTIGATIONS:  CT scans and PET/CT scans reviewed Pathology report reviewed Clinical notes reviewed   HPI: Patient is a 66 year old female who initially presented with ureteral obstruction was found to have a large pelvic lesion invading bone hypermetabolic on PET CT scan. Tumor was showing extrinsic compression on the: Biopsy was positive for urothelial carcinoma undermining colonic mucosa. PET CT scan again demonstrated hypermetabolic activity in the right lower quadrant of this retroperitoneal mass intimately associated with the rectosigmoid colon felt to be extrinsic. There was also right-sided hydronephrosis due to the right lower quadrant mass. Patient has been receiving cisplatin and gemcitabine chemotherapy recently admitted for left lower extremity DVT. She has significant pain most likely from bone involvement of the ureteral carcinoma. I been asked to evaluate the patient for possible palliative radiation therapy to this area. Patient is scheduled for an IVC filter to be placed is responding well to anticoagulant therapy.  PLANNED TREATMENT REGIMEN: Palliative radiation therapy to retroperitoneal mass  PAST MEDICAL HISTORY:  has a past medical history of Anxiety; Arthritis; Asthma; Cancer ([place]); Cervical disc disease; COPD (chronic obstructive pulmonary disease) ([place]); Depression; Dysrhythmia; Fibromyalgia; GERD  (gastroesophageal reflux disease); Hepatitis C; History of hiatal hernia; and Intracerebral hemorrhage ([place]).    PAST SURGICAL HISTORY:  Past Surgical History:  Procedure Laterality Date  . BREAST REDUCTION SURGERY    . cervical discectomy with fusion    . COLONOSCOPY  01/2016  . EUS N/A 06/21/2016   Procedure: LOWER ENDOSCOPIC ULTRASOUND (EUS);  Surgeon: Reita Cliche, MD;  Location: [facility] ENDOSCOPY;  Service: Endoscopy;  Laterality: N/A;  . IR GENERIC HISTORICAL  07/11/2016   IR NEPHROSTOMY PLACEMENT RIGHT 07/11/2016 Greggory Keen, MD [facility]-INTERV RAD  . IR GENERIC HISTORICAL  08/31/2016   IR NEPHROSTOMY TUBE CHANGE 08/31/2016 [facility]INTER[facility] RAD  . PERIPHERAL VASCULAR CATHETERIZATION N/A 07/23/2016   Procedure: Glori Luis Cath Insertion;  Surgeon: Algernon Huxley, MD;  Location: [place] [facility];  Service: Cardiovascular;  Laterality: N/A;    FAMILY HISTORY: family history is not on file.  SOCIAL HISTORY:  reports that she has never smoked. She has never used smokeless tobacco. She reports that she uses drugs, including Marijuana, about 14 times per week. She reports that she does not drink alcohol.  ALLERGIES: Other; Gabapentin; Iodides; Tetanus toxoids; and Iodine  MEDICATIONS:  Current Facility-Administered Medications  Medication Dose Route Frequency Provider Last Rate Last Dose  . [MAR Hold] 0.9 %  sodium chloride infusion  250 mL Intravenous PRN Hillary Bow, MD      . Doug Sou Hold] acetaminophen (TYLENOL) tablet 650 mg  650 mg Oral Q6H PRN Hillary Bow, MD       Or  . Doug Sou Hold] acetaminophen (TYLENOL) suppository 650 mg  650 mg Rectal Q6H PRN Hillary Bow, MD      . Doug Sou Hold] albuterol (PROVENTIL) (2.5 MG/3ML) 0.083% nebulizer solution 2.5 mg  2.5 mg Nebulization Q2H PRN  Hillary Bow, MD      . Doug Sou Hold] baclofen (LIORESAL) tablet 20 mg  20 mg Oral QID PRN Hillary Bow, MD      . Doug Sou Hold] bisacodyl (DULCOLAX) EC tablet 5 mg  5 mg Oral Daily PRN Hillary Bow, MD   5 mg at  09/16/16 1211  . [MAR Hold] Chlorhexidine Gluconate Cloth 2 % PADS 6 each  6 each Topical Q0600 Hillary Bow, MD   6 each at 09/18/16 0645  . [MAR Hold] diltiazem (CARDIZEM CD) 24 hr capsule 240 mg  240 mg Oral QHS Hillary Bow, MD   240 mg at 09/17/16 2245  . [MAR Hold] docusate sodium (COLACE) capsule 100 mg  100 mg Oral BID Hillary Bow, MD   100 mg at 09/18/16 1056  . [MAR Hold] escitalopram (LEXAPRO) tablet 40 mg  40 mg Oral QHS Hillary Bow, MD   40 mg at 09/17/16 2244  . [MAR Hold] famotidine (PEPCID) tablet 20 mg  20 mg Oral BID Hillary Bow, MD   20 mg at 09/18/16 1056  . heparin ADULT infusion 100 units/mL (25000 units/245mL sodium chloride 0.45%)  1,250 Units/hr Intravenous Continuous Sheema M Hallaji, RPH 12.5 mL/hr at 09/18/16 0649 1,250 Units/hr at 09/18/16 0649  . [MAR Hold] lactulose (CHRONULAC) 10 GM/15ML solution 10 g  10 g Oral BID Hillary Bow, MD   10 g at 09/17/16 2247  . [MAR Hold] LORazepam (ATIVAN) tablet 0.5 mg  0.5 mg Oral Q6H PRN Hillary Bow, MD      . Doug Sou Hold] megestrol (MEGACE) 400 MG/10ML suspension 200 mg  200 mg Oral Daily Srikar Sudini, MD   200 mg at 09/17/16 0902  . [MAR Hold] morphine (MS CONTIN) 12 hr tablet 30 mg  30 mg Oral BID Hillary Bow, MD      . Doug Sou Hold] ondansetron [facility] tablet 4 mg  4 mg Oral Q6H PRN Hillary Bow, MD       Or  . Doug Sou Hold] ondansetron (ZOFRAN) injection 4 mg  4 mg Intravenous Q6H PRN Hillary Bow, MD      . Doug Sou Hold] oxyCODONE-acetaminophen (PERCOCET/ROXICET) 5-325 MG per tablet 1-2 tablet  1-2 tablet Oral Q4H PRN Gladstone Lighter, MD   2 tablet at 09/18/16 0655  . [MAR Hold] polyethylene glycol (MIRALAX / GLYCOLAX) packet 17 g  17 g Oral Daily PRN Hillary Bow, MD   17 g at 09/17/16 1649  . [MAR Hold] potassium chloride SA (K-DUR,KLOR-CON) CR tablet 20 mEq  20 mEq Oral Daily Hillary Bow, MD   20 mEq at 09/18/16 1056  . [MAR Hold] simethicone (MYLICON) chewable tablet 160 mg  160 mg Oral TID PRN Gladstone Lighter, MD   160 mg at 09/16/16 1828  . [MAR Hold] sodium chloride flush (NS) 0.9 % injection 3 mL  3 mL Intravenous Q12H Srikar Sudini, MD   3 mL at 09/17/16 1038  . [MAR Hold] sodium chloride flush (NS) 0.9 % injection 3 mL  3 mL Intravenous Q12H Srikar Sudini, MD   10 mL at 09/18/16 1057  . [MAR Hold] sodium chloride flush (NS) 0.9 % injection 3 mL  3 mL Intravenous PRN Hillary Bow, MD       Facility-Administered Medications Ordered in Other Encounters  Medication Dose Route Frequency Provider Last Rate Last Dose  . sodium chloride flush (NS) 0.9 % injection 10 mL  10 mL Intravenous PRN Lequita Asal, MD   10 mL at 07/26/16 0935    ECOG PERFORMANCE  STATUS:  2 - Symptomatic, <50% confined to bed  REVIEW OF SYSTEMS:  Patient denies any weight loss, fatigue, weakness, fever, chills or night sweats. Patient denies any loss of vision, blurred vision. Patient denies any ringing  of the ears or hearing loss. No irregular heartbeat. Patient denies heart murmur or history of fainting. Patient denies any chest pain or pain radiating to her upper extremities. Patient denies any shortness of breath, difficulty breathing at night, cough or hemoptysis. Patient denies any swelling in the lower legs. Patient denies any nausea vomiting, vomiting of blood, or coffee ground material in the vomitus. Patient denies any stomach pain. Patient states has had normal bowel movements no significant constipation or diarrhea. Patient denies any dysuria, hematuria or significant nocturia. Patient denies any problems walking, swelling in the joints or loss of balance. Patient denies any skin changes, loss of hair or loss of weight. Patient denies any excessive worrying or anxiety or significant depression. Patient denies any problems with insomnia. Patient denies excessive thirst, polyuria, polydipsia. Patient denies any swollen glands, patient denies easy bruising or easy bleeding. Patient denies any recent infections,  allergies or URI. Patient "s visual fields have not changed significantly in recent time.    PHYSICAL EXAM: BP 115/63 (BP Location: Right Arm)   Pulse 70   Temp 98.2 F (36.8 C) (Oral)   Resp 16   Ht 5\' 6"  (1.676 m)   Wt 137 lb 8 oz (62.4 kg)   SpO2 99%   BMI 22.19 kg/m  Well-developed female in NAD seen in her hospital bed. Range of motion of her lower extremities does not elicit pain. Pain is elicited on deep palpation of her symphysis pubis. Well-developed well-nourished patient in NAD. HEENT reveals PERLA, EOMI, discs not visualized.  Oral cavity is clear. No oral mucosal lesions are identified. Neck is clear without evidence of cervical or supraclavicular adenopathy. Lungs are clear to A&P. Cardiac examination is essentially unremarkable with regular rate and rhythm without murmur rub or thrill. Abdomen is benign with no organomegaly or masses noted. Motor sensory and DTR levels are equal and symmetric in the upper and lower extremities. Cranial nerves II through XII are grossly intact. Proprioception is intact. No peripheral adenopathy or edema is identified. No motor or sensory levels are noted. Crude visual fields are within normal range.  LABORATORY DATA: Pathology reports reviewed    RADIOLOGY RESULTS: CT scans and PET/CT scans reviewed   IMPRESSION: Locally advanced urothelial carcinoma with large retroperitoneal mass responding to chemotherapy for palliative radiation therapy.  PLAN: At this time like to go ahead with palliative radiation therapy to her pelvis. I would use PET/CT fusion to identify the area of tumor involvement and plan 5000 cGy over 5 weeks using IM RT radiation therapy. I would choose IM RT radiation therapy to spare critical structures such as her bladder: Rectum and small bowel. Risks and benefits of treatment including increased lower urinary tract symptoms diarrhea possible skin reaction fatigue alteration of blood counts all were discussed in detail with  the patient and her partner. I personally set up and ordered CT simulation for early next week R recover from her IVC filter placement.There will be extra effort by both professional staff as well as technical staff to coordinate and manage concurrent chemoradiation and ensuing side effects during her treatments.  I would like to take this opportunity to thank you for allowing me to participate in the care of your patient.Armstead Peaks., MD

## 2016-09-18 NOTE — Progress Notes (Signed)
[place] [facility] [place] NAME: Sandra Landry    MR#:  [id number]  DATE OF BIRTH:  [birth year]  SUBJECTIVE:  CHIEF COMPLAINT:   Chief Complaint  Patient presents with  . Leg Swelling   -Hemoglobin improved with blood transfusion. - Scheduled for IVC filter later today  REVIEW OF SYSTEMS:  Review of Systems  Constitutional: Positive for malaise/fatigue. Negative for chills, fever and weight loss.  HENT: Negative for ear discharge, ear pain, hearing loss and nosebleeds.   Eyes: Negative for blurred vision, double vision and photophobia.  Respiratory: Negative for cough, hemoptysis, shortness of breath and wheezing.   Cardiovascular: Positive for leg swelling. Negative for chest pain, palpitations and orthopnea.  Gastrointestinal: Negative for abdominal pain, constipation, diarrhea, heartburn, melena, nausea and vomiting.  Genitourinary: Negative for dysuria and urgency.  Musculoskeletal: Negative for back pain and myalgias.  Skin: Negative for rash.  Neurological: Negative for dizziness, sensory change, speech change, focal weakness and headaches.  Endo/Heme/Allergies: Does not bruise/bleed easily.  Psychiatric/Behavioral: Negative for depression.    DRUG ALLERGIES:   Allergies  Allergen Reactions  . Other Swelling    Dust, ragweed  . Gabapentin Other (See Comments)    Jerking motions and confusion.  . Iodides   . Tetanus Toxoids Other (See Comments)    Localized pain and swelling  . Iodine Other (See Comments) and Rash    Limited history. Occurred in 26s, at age 80-12, received contrast at public health facility in [place], while being evaluated for meningitis. Immediate rash - not itchy, "flat, red, perfect circles" over entire body. Has not received iodine contrast since.    VITALS:  Blood pressure (!) 102/41, pulse 71, temperature 98.1 F (36.7 C), temperature source Oral, resp. rate 19, height 5\' 6"  (1.676 m), weight  62.4 kg (137 lb 8 oz), SpO2 99 %.  PHYSICAL EXAMINATION:  Physical Exam  GENERAL:  66 y.o.-year-old patient lying in the bed with no acute distress.  EYES: Pupils equal, round, reactive to light and accommodation. No scleral icterus. Extraocular muscles intact.  HEENT: Head atraumatic, normocephalic. Oropharynx and nasopharynx clear.  NECK:  Supple, no jugular venous distention. No thyroid enlargement, no tenderness.  LUNGS: Normal breath sounds bilaterally, no wheezing, rales,rhonchi or crepitation. No use of accessory muscles of respiration.  CARDIOVASCULAR: S1, S2 normal. No murmurs, rubs, or gallops.  ABDOMEN: Soft, nontender, nondistended. Bowel sounds present. No organomegaly or mass.  EXTREMITIES: left leg edema, no erythema noted. No cyanosis, or clubbing.  NEUROLOGIC: Cranial nerves II through XII are intact. Muscle strength 5/5 in all extremities. Sensation intact. Gait not checked.  PSYCHIATRIC: The patient is alert and oriented x 3.  SKIN: No obvious rash, lesion, or ulcer.    LABORATORY PANEL:   CBC  Recent Labs Lab 09/18/16 0402  WBC 6.6  HGB 8.9*  HCT 25.5*  PLT 56*   ------------------------------------------------------------------------------------------------------------------  Chemistries   Recent Labs Lab 09/18/16 0402  NA 131*  K 4.4  CL 105  CO2 22  GLUCOSE 90  BUN 13  CREATININE 1.14*  CALCIUM 8.6*   ------------------------------------------------------------------------------------------------------------------  Cardiac Enzymes No results for input(s): TROPONINI in the last 168 hours. ------------------------------------------------------------------------------------------------------------------  RADIOLOGY:  No results found.  EKG:   Orders placed or performed during the hospital encounter of 08/08/16  . ED EKG  . ED EKG    ASSESSMENT AND PLAN:   66 year old female with past medical history significant for stage IV urothelial  cancer on  chemotherapy, COPD, arthritis presents to hospital secondary to left leg DVT.  # left lower extremity DVT-currently on anticoagulation with heparin drip. -History of subarachnoid hemorrhage 2 months ago, but according to neurosurgeon, can be anticoagulated. Currently with IV heparin.. -risk with long-term anticoagulation would be dropping blood counts and platelets.  -Plan is for IVC filter today No plans to discharge on oral anticoagulation at this time until platelets can be recovered. To be addressed by hematology.  # Acute on chronic anemia-secondary to chemotherapy -No active bleeding noted. Received 2 units prbc yesterday and hb stable at 8.9 today- f/u in AM.  # Acute on chronic thrombocytopenia- secondary to chemotherapy dropping platelets as outpatient requiring transfusion. Also currently on heparin drip. Due to recent subarachnoid hemorrhage, plan is to keep the platelets greater than 50 K. -1 unit platelets - 09/18/2015 -Continue to transfuse tomorrow if needed. No plans to discharge her on oral anticoagulation until platelets can be stabilized as outpatient.  # Stage IV urothelial cancer  Patient is currently on chemotherapy-received 2 cycles so far.  # COPD-stable.  # Hypokalemia-replaced  # chronic pain syndrome On OxyContin and Percocet. Increase dose of OxyContin  Discussed with patient and her partner at bedside  All the records are reviewed and case discussed with Care Management/Social Worker. Management plans discussed with the patient, family and they are in agreement.  CODE STATUS: DO NOT RESUSCITATE  TOTAL TIME TAKING CARE OF THIS PATIENT:25 minutes.  POSSIBLE D/C IN 1-2 DAYS, DEPENDING ON CLINICAL CONDITION.  Hillary Bow R M.D on 09/18/2016 at 12:46 PM  Between 7am to 6pm - Pager - [phone]  After 6pm go to [email or web address] - Proofreader  [facility]  Office  [phone]  CC: Primary care physician; BLISS,  Lynnell Jude, MD

## 2016-09-18 NOTE — OR Nursing (Signed)
continue to hold heparin until check with prime Doc if plan to resume or not

## 2016-09-18 NOTE — Progress Notes (Signed)
Advance care planning  Discussed with patient and her partner at bedside.  She continues to have significant abdominal pain which is chronic from liver cancer. Using OxyContin  With some control.  She feels her pain medications need to be increased in dose for better control. We'll increase oxyContin from 15 mg to 30 mg 2 times a day. Continue Percocet when necessary  Advised that palliative care will be a good resource to be involved in her health care. She wants to think about this and discuss with her partner before making that decision. She wants Dr. Mike Gip to be the physician coordinating her care at this time.  Patient is DO NOT RESUSCITATE  Time 20 min

## 2016-09-18 NOTE — H&P (Signed)
[facility] History & Physical Update  The patient was interviewed and re-examined.  The patient's previous History and Physical has been reviewed and is unchanged.  There is no change in the plan of care. We plan to proceed with the scheduled procedure.  Hortencia Pilar, MD  09/18/2016, 3:13 PM

## 2016-09-18 NOTE — Op Note (Signed)
[facility]   OPERATIVE NOTE    PRE-OPERATIVE DIAGNOSIS: DVT with PE  POST-OPERATIVE DIAGNOSIS: Same  PROCEDURE: 1.   Ultrasound guidance for vascular access to the right femoral vein 2.   Catheter placement into the inferior vena cava 3.   Inferior venacavogram 4.   Placement of a meridian IVC filter  SURGEON: Hortencia Pilar  ASSISTANT(S): None  ANESTHESIA: Conscious sedation was administered under my direct supervision. IV Versed plus fentanyl were utilized. Continuous ECG, pulse oximetry and blood pressure was monitored throughout the entire procedure. Conscious sedation was for a total of 20 minutes.  ESTIMATED BLOOD LOSS: minimal  FINDING(S): 1.  Patent IVC  SPECIMEN(S):  none  INDICATIONS:   Sandra Landry is a 65 y.o. y.o. female who presents with anemia in association with DVT and recurrent malignancy.  Inferior vena cava filter is indicated for this reason.  Risks and benefits including filter thrombosis, migration, fracture, bleeding, and infection were all discussed.  We discussed that all IVC filters that we place can be removed if desired from the patient once the need for the filter has passed.    DESCRIPTION: After obtaining full informed written consent, the patient was brought back to the vascular suite. The skin was sterilely prepped and draped in a sterile surgical field was created. The right common femoral vein was accessed under direct ultrasound guidance without difficulty with a Seldinger needle and a J-wire was then placed. The dilator is passed over the wire and the delivery sheath was placed into the inferior vena cava.  Inferior venacavogram was performed. This demonstrated a patent IVC with the level of the renal veins at L2.  The filter was then deployed into the inferior vena cava at the level of the L2-L3 disc space just below the renal veins. The delivery sheath was then removed. Pressure was held. Sterile dressings were  placed. The patient tolerated the procedure well and was taken to the recovery room in stable condition.  Interpretation: Widely patent IVC measuring 18 mm in diameter. Renal blushes localized as noted above. Meridian filter deployed without difficulty and good orientation  COMPLICATIONS: None  CONDITION: Stable  Hortencia Pilar  09/18/2016, 3:46 PM

## 2016-09-18 NOTE — Progress Notes (Signed)
ANTICOAGULATION CONSULT NOTE - Follow UP  Pharmacy Consult for heparin drip  Indication: DVT  Allergies  Allergen Reactions  . Other Swelling    Dust, ragweed  . Gabapentin Other (See Comments)    Jerking motions and confusion.  . Iodides   . Tetanus Toxoids Other (See Comments)    Localized pain and swelling  . Iodine Other (See Comments) and Rash    Limited history. Occurred in 54s, at age 64-12, received contrast at public health facility in [place], while being evaluated for meningitis. Immediate rash - not itchy, "flat, red, perfect circles" over entire body. Has not received iodine contrast since.   Vital Signs: Temp: 98.1 F (36.7 C) (01/02 0402) Temp Source: Oral (01/02 0402) BP: 102/41 (01/02 0402) Pulse Rate: 71 (01/02 0402)  Labs:  Recent Labs  09/15/16 1517 09/16/16 0034  09/16/16 1750  09/17/16 0236 09/17/16 0904 09/17/16 1409 09/18/16 0402  HGB  --  6.1*  --  8.8*  --  9.2*  --   --  8.9*  HCT  --  18.8*  --  25.6*  --  26.5*  --   --  25.5*  PLT  --  73*  --   --   --  47*  --  66* 56*  APTT 24  --   --   --   --   --   --   --   --   LABPROT 13.1  --   --   --   --   --   --   --  13.7  INR 0.99  --   --   --   --   --   --   --  1.05  HEPARINUNFRC  --  0.10*  0.13*  < >  --   < > 0.51 0.42  --  0.35  CREATININE  --  1.12*  --   --   --  0.96  --   --  1.14*  < > = values in this interval not displayed.  Estimated Creatinine Clearance: 47.3 mL/min (by C-G formula based on SCr of 1.14 mg/dL (H)).  Medical History: Past Medical History:  Diagnosis Date  . Anxiety   . Arthritis   . Asthma    mild  . Cancer ([facility])    urothelia  . Cervical disc disease   . COPD (chronic obstructive pulmonary disease) ([facility])    mild  . Depression   . Dysrhythmia    tachycardia  . Fibromyalgia   . GERD (gastroesophageal reflux disease)   . Hepatitis C   . History of hiatal hernia   . Intracerebral hemorrhage [facility])    Assessment: Pharmacy consulted to  monitor heparin drip in this 66 year old female for treatment of a DVT. 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  Goal of Therapy:  Heparin level 0.3-0.7 units/ml Monitor platelets by anticoagulation protocol: Yes   Plan:  1/1 0904 Confirmatory HL = 0.42 (therapeutic). Plt have decreased to 47,000 with AM labs this morning. Spoke with MD regarding Plt - patient to receive platelets and heparin drip to continue for now. Will continue heparin drip at current rate of 1250 units/hr.   1/2 0402 HL remains therapeutic at 0.35. Plts stable at 56,000 this morning. Contune heparin at 1250units/hr. Monitor plts closely.   Will recheck CBC and HL with AM labs tomorrow.   Pernell Dupre, PharmD, BCPS Clinical Pharmacist 09/18/2016 5:19 AM

## 2016-09-19 ENCOUNTER — Inpatient Hospital Stay: Payer: 59 | Attending: Hematology and Oncology

## 2016-09-19 ENCOUNTER — Encounter: Payer: Self-pay | Admitting: Vascular Surgery

## 2016-09-19 DIAGNOSIS — F329 Major depressive disorder, single episode, unspecified: Secondary | ICD-10-CM | POA: Insufficient documentation

## 2016-09-19 DIAGNOSIS — R634 Abnormal weight loss: Secondary | ICD-10-CM | POA: Insufficient documentation

## 2016-09-19 DIAGNOSIS — Z923 Personal history of irradiation: Secondary | ICD-10-CM | POA: Insufficient documentation

## 2016-09-19 DIAGNOSIS — Z86010 Personal history of colonic polyps: Secondary | ICD-10-CM | POA: Insufficient documentation

## 2016-09-19 DIAGNOSIS — J45909 Unspecified asthma, uncomplicated: Secondary | ICD-10-CM | POA: Insufficient documentation

## 2016-09-19 DIAGNOSIS — K589 Irritable bowel syndrome without diarrhea: Secondary | ICD-10-CM | POA: Insufficient documentation

## 2016-09-19 DIAGNOSIS — M7989 Other specified soft tissue disorders: Secondary | ICD-10-CM | POA: Insufficient documentation

## 2016-09-19 DIAGNOSIS — C786 Secondary malignant neoplasm of retroperitoneum and peritoneum: Secondary | ICD-10-CM | POA: Insufficient documentation

## 2016-09-19 DIAGNOSIS — Z7901 Long term (current) use of anticoagulants: Secondary | ICD-10-CM | POA: Insufficient documentation

## 2016-09-19 DIAGNOSIS — D649 Anemia, unspecified: Secondary | ICD-10-CM | POA: Insufficient documentation

## 2016-09-19 DIAGNOSIS — M5030 Other cervical disc degeneration, unspecified cervical region: Secondary | ICD-10-CM | POA: Insufficient documentation

## 2016-09-19 DIAGNOSIS — M797 Fibromyalgia: Secondary | ICD-10-CM | POA: Insufficient documentation

## 2016-09-19 DIAGNOSIS — Z79899 Other long term (current) drug therapy: Secondary | ICD-10-CM | POA: Insufficient documentation

## 2016-09-19 DIAGNOSIS — R1031 Right lower quadrant pain: Secondary | ICD-10-CM | POA: Insufficient documentation

## 2016-09-19 DIAGNOSIS — F419 Anxiety disorder, unspecified: Secondary | ICD-10-CM | POA: Insufficient documentation

## 2016-09-19 DIAGNOSIS — Z8701 Personal history of pneumonia (recurrent): Secondary | ICD-10-CM | POA: Insufficient documentation

## 2016-09-19 DIAGNOSIS — Z87440 Personal history of urinary (tract) infections: Secondary | ICD-10-CM | POA: Insufficient documentation

## 2016-09-19 DIAGNOSIS — Z86718 Personal history of other venous thrombosis and embolism: Secondary | ICD-10-CM | POA: Insufficient documentation

## 2016-09-19 DIAGNOSIS — I4891 Unspecified atrial fibrillation: Secondary | ICD-10-CM | POA: Insufficient documentation

## 2016-09-19 DIAGNOSIS — K219 Gastro-esophageal reflux disease without esophagitis: Secondary | ICD-10-CM | POA: Insufficient documentation

## 2016-09-19 DIAGNOSIS — N1330 Unspecified hydronephrosis: Secondary | ICD-10-CM | POA: Insufficient documentation

## 2016-09-19 DIAGNOSIS — J449 Chronic obstructive pulmonary disease, unspecified: Secondary | ICD-10-CM | POA: Insufficient documentation

## 2016-09-19 DIAGNOSIS — Z8619 Personal history of other infectious and parasitic diseases: Secondary | ICD-10-CM | POA: Insufficient documentation

## 2016-09-19 DIAGNOSIS — M129 Arthropathy, unspecified: Secondary | ICD-10-CM | POA: Insufficient documentation

## 2016-09-19 DIAGNOSIS — D72829 Elevated white blood cell count, unspecified: Secondary | ICD-10-CM | POA: Insufficient documentation

## 2016-09-19 DIAGNOSIS — Z8781 Personal history of (healed) traumatic fracture: Secondary | ICD-10-CM | POA: Insufficient documentation

## 2016-09-19 DIAGNOSIS — Z9221 Personal history of antineoplastic chemotherapy: Secondary | ICD-10-CM | POA: Insufficient documentation

## 2016-09-19 DIAGNOSIS — C661 Malignant neoplasm of right ureter: Secondary | ICD-10-CM | POA: Insufficient documentation

## 2016-09-19 LAB — CBC WITH DIFFERENTIAL/PLATELET
Basophils Absolute: 0 10*3/uL (ref 0–0.1)
Basophils Relative: 0 %
EOS PCT: 0 %
Eosinophils Absolute: 0 10*3/uL (ref 0–0.7)
HEMATOCRIT: 27 % — AB (ref 35.0–47.0)
Hemoglobin: 9.3 g/dL — ABNORMAL LOW (ref 12.0–16.0)
LYMPHS ABS: 0.6 10*3/uL — AB (ref 1.0–3.6)
LYMPHS PCT: 9 %
MCH: 28.6 pg (ref 26.0–34.0)
MCHC: 34.3 g/dL (ref 32.0–36.0)
MCV: 83.2 fL (ref 80.0–100.0)
MONO ABS: 0.1 10*3/uL — AB (ref 0.2–0.9)
MONOS PCT: 1 %
NEUTROS ABS: 6 10*3/uL (ref 1.4–6.5)
Neutrophils Relative %: 90 %
PLATELETS: 55 10*3/uL — AB (ref 150–440)
RBC: 3.24 MIL/uL — ABNORMAL LOW (ref 3.80–5.20)
RDW: 18.7 % — AB (ref 11.5–14.5)
WBC: 6.7 10*3/uL (ref 3.6–11.0)

## 2016-09-19 LAB — HEPARIN LEVEL (UNFRACTIONATED): Heparin Unfractionated: 0.42 IU/mL (ref 0.30–0.70)

## 2016-09-19 MED ORDER — ENOXAPARIN SODIUM 60 MG/0.6ML ~~LOC~~ SOLN
1.0000 mg/kg | Freq: Two times a day (BID) | SUBCUTANEOUS | Status: DC
Start: 2016-09-19 — End: 2016-09-20
  Administered 2016-09-19 – 2016-09-20 (×3): 60 mg via SUBCUTANEOUS
  Filled 2016-09-19 (×3): qty 0.6

## 2016-09-19 NOTE — Progress Notes (Unsigned)
PSN spoke with patient's partner, Sandra Landry, about needs and concerns.  No needs identified at this time, but issues may arise in the future with needing hourly assistance in the home while Sandra Landry works.  PSN informed Sandra Landry that if this need did arise, that she could call back and discuss options.

## 2016-09-19 NOTE — Progress Notes (Signed)
[facility] Progress Note  Date of admission: 09/15/2016  Hospital day:  09/18/2016  Chief Complaint: Sandra Landry is a 66 y.o. female with locally advanced right ureteral carcinoma currently day 15 of cycle #2 cisplatin and gemcitabine who was admitted with a left lower extremity DVT.  Subjective:  Feeling "the same".  Fatigue.  Hungry (as NPO).  No bruising or bleeding.  Social History: The patient is accompanied by Darliss Ridgel, her partner, today.  Allergies:  Allergies  Allergen Reactions  . Other Swelling    Dust, ragweed  . Gabapentin Other (See Comments)    Jerking motions and confusion.  . Iodides   . Tetanus Toxoids Other (See Comments)    Localized pain and swelling  . Iodine Other (See Comments) and Rash    Limited history. Occurred in 42s, at age 58-12, received contrast at public health facility in [place], while being evaluated for meningitis. Immediate rash - not itchy, "flat, red, perfect circles" over entire body. Has not received iodine contrast since.    Scheduled Medications: . Chlorhexidine Gluconate Cloth  6 each Topical Q0600  . diltiazem  240 mg Oral QHS  . docusate sodium  100 mg Oral BID  . escitalopram  40 mg Oral QHS  . famotidine  20 mg Oral BID  . lactulose  10 g Oral BID  . megestrol  200 mg Oral Daily  . morphine  30 mg Oral BID  . potassium chloride SA  20 mEq Oral Daily  . sodium chloride flush  3 mL Intravenous Q12H  . sodium chloride flush  3 mL Intravenous Q12H    Review of Systems: GENERAL: Fatigue. No fever or sweats. Weight gain of 12 pounds since last week (fluid weight). PERFORMANCE STATUS (ECOG): 3 HEENT: Decreased vision in left eye since fall (diplopia when eye open). No runny nose, sore throat, mouth sores or tenderness. Lungs: No increased shortness of breath.  No cough. No hemoptysis. Cardiac: No chest pain, palpitations, orthopnea, or PND. GI: Hungry as NPO.   Chronic nausea.  Constipation, relieved.  No diarrhea, melena or hematochezia. GU: Right urostomy tube. No urgency, frequency, or dysuria. Musculoskeletal: Right sided back pain. No joint pain. No muscle tenderness. Extremities: Left leg swelling, continued improvement. Skin: No rashes or skin changes. Neuro: No falls.  No numbness or weakness, or coordination issues. Endocrine: No diabetes, thyroid issues, hot flashes or night sweats. Psych: Hypersensitive. PTSD. Depression. Anxiety. Pain: Low back/pelvic pain. Review of systems: All other systems reviewed and found to be negative.  Physical Exam: Blood pressure 107/60, pulse 89, temperature 98.1 F (36.7 C), temperature source Oral, resp. rate 20, height 5' 6"  (1.676 m), weight 137 lb 8 oz (62.4 kg), SpO2 99 %.  GENERAL:Thin, fatigued appearing woman lying in bed on the medical unit in no acute distress.  MENTAL STATUS: Alert and oriented to person, place and time. HEAD:Short brown hair. Normocephalic, atraumatic, face symmetric, no Cushingoid features. EYES:Pupils equal round and reactive to light and accomodation. No conjunctivitis or scleral icterus.  CWU:[id number] clear without lesion. No upper teeth. Tonguenormal. Mucous membranes moist. RESPIRATORY:Clear to auscultationwithout rales, wheezes or rhonchi. CARDIOVASCULAR:Regular rate andrhythmwithout murmur, rub or gallop. ABDOMEN:Soft, nontender with active bowel sounds and no hepatosplenomegaly.  SKIN: No rashes, ulcers or lesions. EXTREMITIES: 1-2+ left lower extremity edema, improved.  No skin discoloration or tenderness.  NEUROLOGICAL: Appropriate. PSYCH: Appropriate.   Results for orders placed or performed during the hospital encounter of 09/15/16 (from the past  48 hour(s))  CBC     Status: Abnormal   Collection Time: 09/17/16  2:36 AM  Result Value Ref Range   WBC 7.0 3.6 - 11.0 K/uL   RBC 3.27 (L) 3.80 - 5.20 MIL/uL    Hemoglobin 9.2 (L) 12.0 - 16.0 g/dL   HCT 26.5 (L) 35.0 - 47.0 %   MCV 81.0 80.0 - 100.0 fL   MCH 28.2 26.0 - 34.0 pg   MCHC 34.9 32.0 - 36.0 g/dL   RDW 18.8 (H) 11.5 - 14.5 %   Platelets 47 (L) 150 - 440 K/uL  Basic metabolic panel     Status: Abnormal   Collection Time: 09/17/16  2:36 AM  Result Value Ref Range   Sodium 132 (L) 135 - 145 mmol/L   Potassium 3.8 3.5 - 5.1 mmol/L   Chloride 105 101 - 111 mmol/L   CO2 19 (L) 22 - 32 mmol/L   Glucose, Bld 93 65 - 99 mg/dL   BUN 14 6 - 20 mg/dL   Creatinine, Ser 0.96 0.44 - 1.00 mg/dL   Calcium 8.3 (L) 8.9 - 10.3 mg/dL   GFR calc non Af Amer >60 >60 mL/min   GFR calc Af Amer >60 >60 mL/min    Comment: (NOTE) The eGFR has been calculated using the CKD EPI equation. This calculation has not been validated in all clinical situations. eGFR's persistently <60 mL/min signify possible Chronic Kidney Disease.    Anion gap 8 5 - 15  Heparin level (unfractionated)     Status: None   Collection Time: 09/17/16  2:36 AM  Result Value Ref Range   Heparin Unfractionated 0.51 0.30 - 0.70 IU/mL    Comment:        IF HEPARIN RESULTS ARE BELOW EXPECTED VALUES, AND PATIENT DOSAGE HAS BEEN CONFIRMED, SUGGEST FOLLOW UP TESTING OF ANTITHROMBIN III LEVELS.   Prepare Pheresed Platelets     Status: None   Collection Time: 09/17/16  7:50 AM  Result Value Ref Range   Unit Number [id number]    Blood Component Type PLTP LR2 PAS    Unit division 00    Status of Unit ISSUED,FINAL    Transfusion Status OK TO TRANSFUSE   Heparin level (unfractionated)     Status: None   Collection Time: 09/17/16  9:04 AM  Result Value Ref Range   Heparin Unfractionated 0.42 0.30 - 0.70 IU/mL    Comment:        IF HEPARIN RESULTS ARE BELOW EXPECTED VALUES, AND PATIENT DOSAGE HAS BEEN CONFIRMED, SUGGEST FOLLOW UP TESTING OF ANTITHROMBIN III LEVELS.   Platelet count     Status: Abnormal   Collection Time: 09/17/16  2:09 PM  Result Value Ref Range   Platelets 66  (L) 150 - 440 K/uL  CBC     Status: Abnormal   Collection Time: 09/18/16  4:02 AM  Result Value Ref Range   WBC 6.6 3.6 - 11.0 K/uL   RBC 3.13 (L) 3.80 - 5.20 MIL/uL   Hemoglobin 8.9 (L) 12.0 - 16.0 g/dL   HCT 25.5 (L) 35.0 - 47.0 %   MCV 81.5 80.0 - 100.0 fL   MCH 28.3 26.0 - 34.0 pg   MCHC 34.7 32.0 - 36.0 g/dL   RDW 18.7 (H) 11.5 - 14.5 %   Platelets 56 (L) 150 - 440 K/uL  Heparin level (unfractionated)     Status: None   Collection Time: 09/18/16  4:02 AM  Result Value Ref Range   Heparin  Unfractionated 0.35 0.30 - 0.70 IU/mL    Comment:        IF HEPARIN RESULTS ARE BELOW EXPECTED VALUES, AND PATIENT DOSAGE HAS BEEN CONFIRMED, SUGGEST FOLLOW UP TESTING OF ANTITHROMBIN III LEVELS.   Basic metabolic panel     Status: Abnormal   Collection Time: 09/18/16  4:02 AM  Result Value Ref Range   Sodium 131 (L) 135 - 145 mmol/L   Potassium 4.4 3.5 - 5.1 mmol/L   Chloride 105 101 - 111 mmol/L   CO2 22 22 - 32 mmol/L   Glucose, Bld 90 65 - 99 mg/dL   BUN 13 6 - 20 mg/dL   Creatinine, Ser 1.14 (H) 0.44 - 1.00 mg/dL   Calcium 8.6 (L) 8.9 - 10.3 mg/dL   GFR calc non Af Amer 50 (L) >60 mL/min   GFR calc Af Amer 58 (L) >60 mL/min    Comment: (NOTE) The eGFR has been calculated using the CKD EPI equation. This calculation has not been validated in all clinical situations. eGFR's persistently <60 mL/min signify possible Chronic Kidney Disease.    Anion gap 4 (L) 5 - 15  Protime-INR     Status: None   Collection Time: 09/18/16  4:02 AM  Result Value Ref Range   Prothrombin Time 13.7 11.4 - 15.2 seconds   INR 1.05    No results found.  Assessment:  Sandra Landry is a 66 y.o. female with with locally advanced right ureteral carcinoma currently day 15 s/p cycle #2 cisplatin and gemcitabine who was admitted with an extensive left lower extremity DVT.  She was [facility] to [facility] on 08/08/2016 following a fall. Head MRI revealed a subarachnoid hemorrhage. She had atrial fibrillation  with RVR.  Platelets were maintained > 50,000.  She received 2 units of PRBCs and 1 unit of platelets.  Chest, abdomen, and pelvic CTscan on 08/08/2016 revealed a mild age-indeterminate compression fracture of T6. There was a 6 mm hypodensity at the dome of the liver too small to characterize and a few additional small subcentimeter hypodense scattered lesions in the liver. There was a 7.3 x 6.4 x 7.7 cm right pelvic mass invading the sacrum and causing a lytic lesion with pathologic fracture. The mass likely invaded the right psoas muscle.  She has anemia and thrombocytopenia.  Hematocrit improved from 18.8 to 26.5 to 25.5 after 2 units of PRBCs.  Platelet count drifting down from  95,000 to 73,000 to 47,000.   One hour post platelet count was 66,000.  Current platelet count 56,000.  She denies any bleeding.  Plan:   1.  Oncology:  Day 15 of cycle #2 cisplatin and gemcitabine.  Although gemcitabine decreased by 20% with cycle #2, platelet count may continue to decrease further.  I believe platelet count decreased to 20,000 with cycle #1 (she was at [facility]) and she received a platelet transfusion to maintain platelets > 50,000 s/p subarachnoid hemorrhage.    Patent can be anticoagulated with heparin as long as platelet count > 50,000.    Vascular surgery to place an IVC filter today.  No plans to switch to any oral anticoagulation given dropping platelet count.  Patient may need to be off anticoagulation in the outpatient department once filter placed until platelet count recovers.  Discussed use of Lovenox (and then eventual switch to oral anticoagulation) in the outpatient department if platelet count rising.  Plan for restaging CT scans after recovery from this cycle.  Patient interested in palliative radiation followed by Tecentriq.  Consult with radiation oncology today for planned simulation next week and 5 weeks of palliative radiation.    Initial Tecentriq study with 119 patient discussed  yesterday.  Response rate approximately 25% with 10% CR.  Median overall survival 16 months.  In the expanded phase I study, 15% response rate with 84% prolonged responses (> 37month).  Patient aware of side effects including fatigue and autoimmune reactions.  Patient is not a candidate for debulking surgery.  2.  Hematology:  Anemia and thrombocytopenia secondary to chemotherapy (see above).  Prior work-up on 07/26/2016 revealed anemia of chronic disease.  No evidence of bleeding.  Guaiac all stools.  Left lower extremity DVT secondary to immobilization and malignancy.  Anticoagulation ongoing with heparin for now (as long as platelets > 50,000).  Platelet count was 56,000 this morning.  Hematocrit increased after 2 units of PRBCs yesterday.  Check CBC daily.  3.  Code status:  DNR.  4.  Disposition:  Anticipate discharge home on 09/19/2016.  Follow-up with radiation oncology and medical oncology on 09/24/2016.   MLequita Asal MD  09/18/2016

## 2016-09-19 NOTE — Progress Notes (Signed)
ANTICOAGULATION CONSULT NOTE - Follow UP  Pharmacy Consult for heparin drip  Indication: DVT  Allergies  Allergen Reactions  . Other Swelling    Dust, ragweed  . Gabapentin Other (See Comments)    Jerking motions and confusion.  . Iodides   . Tetanus Toxoids Other (See Comments)    Localized pain and swelling  . Iodine Other (See Comments) and Rash    Limited history. Occurred in 45s, at age 64-12, received contrast at public health facility in [place], while being evaluated for meningitis. Immediate rash - not itchy, "flat, red, perfect circles" over entire body. Has not received iodine contrast since.   Vital Signs: Temp: 98 F (36.7 C) (01/03 0459) Temp Source: Oral (01/03 0459) BP: 113/70 (01/03 0459) Pulse Rate: 89 (01/03 0459)  Labs:  Recent Labs  09/17/16 0236 09/17/16 0904 09/17/16 1409 09/18/16 0402 09/19/16 0507  HGB 9.2*  --   --  8.9* 9.3*  HCT 26.5*  --   --  25.5* 27.0*  PLT 47*  --  66* 56* 55*  LABPROT  --   --   --  13.7  --   INR  --   --   --  1.05  --   HEPARINUNFRC 0.51 0.42  --  0.35 0.42  CREATININE 0.96  --   --  1.14*  --     Estimated Creatinine Clearance: 47.3 mL/min (by C-G formula based on SCr of 1.14 mg/dL (H)).  Medical History: Past Medical History:  Diagnosis Date  . Anxiety   . Arthritis   . Asthma    mild  . Cancer (HCC)    urothelia  . Cervical disc disease   . COPD (chronic obstructive pulmonary disease) (HCC)    mild  . Depression   . Dysrhythmia    tachycardia  . Fibromyalgia   . GERD (gastroesophageal reflux disease)   . Hepatitis C   . History of hiatal hernia   . Intracerebral hemorrhage [facility])    Assessment: Pharmacy consulted to monitor heparin drip in this 66 year old female for treatment of a DVT. 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  Goal of Therapy:  Heparin level 0.3-0.7 units/ml Monitor platelets by anticoagulation protocol: Yes   Plan:  1/1 0904  Confirmatory HL = 0.42 (therapeutic). Plt have decreased to 47,000 with AM labs this morning. Spoke with MD regarding Plt - patient to receive platelets and heparin drip to continue for now. Will continue heparin drip at current rate of 1250 units/hr.   1/2 0402 HL remains therapeutic at 0.35. Plts stable at 56,000 this morning. Contune heparin at 1250units/hr. Monitor plts closely.   1/3 0507 HL therapeutic. Plt 55k.  Continue current rate. Pharmacy will continue to follow.  Will recheck CBC and HL with AM labs tomorrow.   Laural Benes, PharmD, BCPS Clinical Pharmacist 09/19/2016 6:13 AM

## 2016-09-19 NOTE — Progress Notes (Signed)
ANTICOAGULATION CONSULT NOTE - Initial Consult  Pharmacy Consult for Enoxaparin  Indication: DVT  Allergies  Allergen Reactions  . Other Swelling    Dust, ragweed  . Gabapentin Other (See Comments)    Jerking motions and confusion.  . Iodides   . Tetanus Toxoids Other (See Comments)    Localized pain and swelling  . Iodine Other (See Comments) and Rash    Limited history. Occurred in 32s, at age 66-12, received contrast at public health facility in [place], while being evaluated for meningitis. Immediate rash - not itchy, "flat, red, perfect circles" over entire body. Has not received iodine contrast since.    Patient Measurements: Height: 5\' 6"  (167.6 cm) Weight: 135 lb 11.2 oz (61.6 kg) IBW/kg (Calculated) : 59.3 Heparin Dosing Weight:   Vital Signs: Temp: 98 F (36.7 C) (01/03 0459) Temp Source: Oral (01/03 0459) BP: 113/70 (01/03 0459) Pulse Rate: 89 (01/03 0459)  Labs:  Recent Labs  09/17/16 0236 09/17/16 0904 09/17/16 1409 09/18/16 0402 09/19/16 0507  HGB 9.2*  --   --  8.9* 9.3*  HCT 26.5*  --   --  25.5* 27.0*  PLT 47*  --  66* 56* 55*  LABPROT  --   --   --  13.7  --   INR  --   --   --  1.05  --   HEPARINUNFRC 0.51 0.42  --  0.35 0.42  CREATININE 0.96  --   --  1.14*  --     Estimated Creatinine Clearance: 47.3 mL/min (by C-G formula based on SCr of 1.14 mg/dL (H)).   Medical History: Past Medical History:  Diagnosis Date  . Anxiety   . Arthritis   . Asthma    mild  . Cancer (HCC)    urothelia  . Cervical disc disease   . COPD (chronic obstructive pulmonary disease) (HCC)    mild  . Depression   . Dysrhythmia    tachycardia  . Fibromyalgia   . GERD (gastroesophageal reflux disease)   . Hepatitis C   . History of hiatal hernia   . Intracerebral hemorrhage (HCC)     Medications:  Prescriptions Prior to Admission  Medication Sig Dispense Refill Last Dose  . baclofen (LIORESAL) 20 MG tablet Take 20 mg by mouth 4 (four) times  daily as needed for muscle spasms.    prn at prn  . bisacodyl (DULCOLAX) 5 MG EC tablet Take 5 mg by mouth daily as needed for moderate constipation.   prn at prn  . dexamethasone (DECADRON) 4 MG tablet Take 2 tablets by mouth once a day on the day after cisplatin chemotherapy and then take 2 tablets two times a day for 2 days. Take with food. 30 tablet 1 prn at prn  . diltiazem (CARDIZEM CD) 120 MG 24 hr capsule Take 240 mg by mouth at bedtime.    09/14/2016 at 2200  . diphenhydrAMINE (BENADRYL) 25 MG tablet Take 25-50 mg by mouth every 6 (six) hours as needed for allergies.    09/14/2016 at 2000  . escitalopram (LEXAPRO) 20 MG tablet Take 40 mg by mouth at bedtime.    09/14/2016 at 2000  . Iron-Vitamin C (VITRON-C PO) Take 1 tablet by mouth daily.   09/15/2016 at 0800  . lactulose (CHRONULAC) 10 GM/15ML solution Take 10 g by mouth 2 (two) times daily.    09/14/2016 at 2000  . lidocaine-prilocaine (EMLA) cream Apply cream to port site 1 hour prior to chemotherapy,  place small amount of saran wrap to cover cream and protect clothing. 30 g 1 prn at prn  . LORazepam (ATIVAN) 0.5 MG tablet Take 1 tablet (0.5 mg total) by mouth every 6 (six) hours as needed (Nausea or vomiting). 30 tablet 0 prn at prn  . megestrol (MEGACE) 400 MG/10ML suspension Take 5 mLs (200 mg total) by mouth daily. 240 mL 0 09/15/2016 at 0800  . morphine (MS CONTIN) 15 MG 12 hr tablet Take 15 mg by mouth 2 (two) times daily.   09/14/2016 at 2200  . NEOMYCIN-POLYMYXIN-HYDROCORTISONE (CORTISPORIN) 1 % SOLN otic solution INSTILL 2 DROPS TO AFFECTED EAR 4 TIMES DAILY  2 prn at prn  . ondansetron (ZOFRAN) 8 MG tablet Take 1 tablet (8 mg total) by mouth 2 (two) times daily as needed. Start on the third day after cisplatin chemotherapy. 30 tablet 1 prn at prn  . oxyCODONE-acetaminophen (PERCOCET/ROXICET) 5-325 MG tablet Take 1 tablet by mouth every 4 (four) hours as needed for moderate pain or severe pain.    09/15/2016 at 1200  .  polyethylene glycol (MIRALAX / GLYCOLAX) packet Take 17 g by mouth daily as needed for mild constipation.    prn at prn  . potassium chloride (K-DUR) 10 MEQ tablet Take 1 tablet (10 mEq total) by mouth daily. (Patient taking differently: Take 20 mEq by mouth daily. ) 30 tablet 1 09/15/2016 at 0800  . prochlorperazine (COMPAZINE) 5 MG/ML injection Inject 10 mg into the muscle every 4 (four) hours as needed (nausea).    prn at prn  . promethazine (PHENERGAN) 25 MG suppository Place 25 mg rectally every 6 (six) hours as needed for nausea or vomiting.    prn at prn  . ranitidine (ZANTAC) 300 MG tablet Take 300 mg by mouth 2 (two) times daily.    09/15/2016 at 0800   Scheduled:  . diltiazem  240 mg Oral QHS  . docusate sodium  100 mg Oral BID  . enoxaparin (LOVENOX) injection  1 mg/kg Subcutaneous Q12H  . escitalopram  40 mg Oral QHS  . famotidine  20 mg Oral BID  . lactulose  10 g Oral BID  . megestrol  200 mg Oral Daily  . morphine  30 mg Oral BID  . potassium chloride SA  20 mEq Oral Daily  . sodium chloride flush  3 mL Intravenous Q12H  . sodium chloride flush  3 mL Intravenous Q12H   Infusions:    Assessment: Pharmacy consulted to dose and monitor Lovenox in this 66 year old female being treated for DVT. Patient previously on Heparin gtt; however now being transitioned to Lovenox therapy by MD. Patient's h/h/platelets are stable however will need to watch platelets closely since patient's platelets are only 55k.   Goal of Therapy:     Plan:  Will start Lovenox 1 mg /kg q12 hours. Will check platelets and cbc panel with am labs.    Sandra Landry D 09/19/2016,10:56 AM

## 2016-09-19 NOTE — Progress Notes (Signed)
[place] [facility] [place] NAME: Sandra Landry    MR#:  [id number]  DATE OF BIRTH:  03-05-1951  SUBJECTIVE:  CHIEF COMPLAINT:   Chief Complaint  Patient presents with  . Leg Swelling   Abd pain better No bleeding  REVIEW OF SYSTEMS:  Review of Systems  Constitutional: Positive for malaise/fatigue. Negative for chills, fever and weight loss.  HENT: Negative for ear discharge, ear pain, hearing loss and nosebleeds.   Eyes: Negative for blurred vision, double vision and photophobia.  Respiratory: Negative for cough, hemoptysis, shortness of breath and wheezing.   Cardiovascular: Positive for leg swelling. Negative for chest pain, palpitations and orthopnea.  Gastrointestinal: Negative for abdominal pain, constipation, diarrhea, heartburn, melena, nausea and vomiting.  Genitourinary: Negative for dysuria and urgency.  Musculoskeletal: Negative for back pain and myalgias.  Skin: Negative for rash.  Neurological: Negative for dizziness, sensory change, speech change, focal weakness and headaches.  Endo/Heme/Allergies: Does not bruise/bleed easily.  Psychiatric/Behavioral: Negative for depression.    DRUG ALLERGIES:   Allergies  Allergen Reactions  . Other Swelling    Dust, ragweed  . Gabapentin Other (See Comments)    Jerking motions and confusion.  . Iodides   . Tetanus Toxoids Other (See Comments)    Localized pain and swelling  . Iodine Other (See Comments) and Rash    Limited history. Occurred in 38s, at age 4-12, received contrast at public health facility in [place], while being evaluated for meningitis. Immediate rash - not itchy, "flat, red, perfect circles" over entire body. Has not received iodine contrast since.    VITALS:  Blood pressure 113/70, pulse 89, temperature 98 F (36.7 C), temperature source Oral, resp. rate 20, height 5\' 6"  (1.676 m), weight 61.6 kg (135 lb 11.2 oz), SpO2 99 %.  PHYSICAL EXAMINATION:    Physical Exam  GENERAL:  66 y.o.-year-old patient lying in the bed with no acute distress.  EYES: Pupils equal, round, reactive to light and accommodation. No scleral icterus. Extraocular muscles intact.  HEENT: Head atraumatic, normocephalic. Oropharynx and nasopharynx clear.  NECK:  Supple, no jugular venous distention. No thyroid enlargement, no tenderness.  LUNGS: Normal breath sounds bilaterally, no wheezing, rales,rhonchi or crepitation. No use of accessory muscles of respiration.  CARDIOVASCULAR: S1, S2 normal. No murmurs, rubs, or gallops.  ABDOMEN: Soft, nontender, nondistended. Bowel sounds present. No organomegaly or mass.  EXTREMITIES: left leg edema, no erythema noted. No cyanosis, or clubbing.  NEUROLOGIC: Cranial nerves II through XII are intact. Muscle strength 5/5 in all extremities. Sensation intact. Gait not checked.  PSYCHIATRIC: The patient is alert and oriented x 3.  SKIN: No obvious rash, lesion, or ulcer.    LABORATORY PANEL:   CBC  Recent Labs Lab 09/19/16 0507  WBC 6.7  HGB 9.3*  HCT 27.0*  PLT 55*   ------------------------------------------------------------------------------------------------------------------  Chemistries   Recent Labs Lab 09/18/16 0402  NA 131*  K 4.4  CL 105  CO2 22  GLUCOSE 90  BUN 13  CREATININE 1.14*  CALCIUM 8.6*   ------------------------------------------------------------------------------------------------------------------  Cardiac Enzymes No results for input(s): TROPONINI in the last 168 hours. ------------------------------------------------------------------------------------------------------------------  RADIOLOGY:  No results found.  EKG:   Orders placed or performed during the hospital encounter of 08/08/16  . ED EKG  . 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  # left lower extremity DVT-currently on anticoagulation with heparin drip. -History of subarachnoid hemorrhage 2 months ago, but according to neurosurgeon, can be anticoagulated. Currently with IV heparin.. - IVC filter placed 09/20/2015 - Stop heparin and start Lovenox Repeat CBC in AM D/C home on lovenox if Platelets and Hb stable  # Acute on chronic anemia-secondary to chemotherapy -No active bleeding noted. Received 2 units prbc and hb stable   # Acute on chronic thrombocytopenia- secondary to chemotherapy dropping platelets as outpatient requiring transfusion. Also currently on heparin drip. Due to recent subarachnoid hemorrhage, plan is to keep the platelets greater than 50 K. -1 unit platelets - 09/18/2015  # Stage IV urothelial cancer  Patient is currently on chemotherapy-received 2 cycles so far.  # COPD-stable.  # Hypokalemia-replaced  # chronic pain syndrome On OxyContin and Percocet. Increased dose of OxyContin yesterday  Discussed with patient and her partner at bedside  All the records are reviewed and case discussed with Care Management/Social Worker. Management plans discussed with the patient, family and they are in agreement.  CODE STATUS: DO NOT RESUSCITATE  TOTAL TIME TAKING CARE OF THIS PATIENT:25 minutes.  POSSIBLE D/C IN 1-2 DAYS, DEPENDING ON CLINICAL CONDITION.  Hillary Bow R M.D on 09/19/2016 at 9:45 AM  Between 7am to 6pm - Pager - [phone]  After 6pm go to [email or web address] - Proofreader  [facility]  Office  [phone]  CC: Primary care physician; BLISS, Lynnell Jude, MD

## 2016-09-19 NOTE — Care Management (Signed)
Admitted to this facility with the diagnosis of left leg DVT. Lives with friend, Mardene Celeste [phone] or [phone]). Last seen Dr. Clemmie Krill a week ago. States she is receiving home health in the home as needed now. Id IV fluids are needed they are ordered per MD and given by nursing staff in the home. Doesn't know name of agency. No skilled facility. No home oxygen. IV pole, wheelchair, rolling walker, and bedside commode in the home. No Life Alert. Last fall was a week ago. Appetite is better with medication. Takes care of all basic activities of daily living herself, can drive, if needed. Prescriptions are filled at [facility] in [place]. Was a truck driver in the past. Friend will transport. Received chemotherapy last week. Shelbie Ammons RN MSN CCM Care Management

## 2016-09-20 ENCOUNTER — Inpatient Hospital Stay: Payer: 59

## 2016-09-20 ENCOUNTER — Telehealth: Payer: Self-pay | Admitting: Hematology and Oncology

## 2016-09-20 ENCOUNTER — Other Ambulatory Visit: Payer: Self-pay | Admitting: *Deleted

## 2016-09-20 ENCOUNTER — Encounter: Payer: Self-pay | Admitting: Interventional Radiology

## 2016-09-20 HISTORY — PX: IR GENERIC HISTORICAL: IMG1180011

## 2016-09-20 LAB — CBC
HCT: 24.8 % — ABNORMAL LOW (ref 35.0–47.0)
HEMOGLOBIN: 8.4 g/dL — AB (ref 12.0–16.0)
MCH: 28.2 pg (ref 26.0–34.0)
MCHC: 33.9 g/dL (ref 32.0–36.0)
MCV: 83.2 fL (ref 80.0–100.0)
Platelets: 70 10*3/uL — ABNORMAL LOW (ref 150–440)
RBC: 2.99 MIL/uL — ABNORMAL LOW (ref 3.80–5.20)
RDW: 19.2 % — AB (ref 11.5–14.5)
WBC: 8.3 10*3/uL (ref 3.6–11.0)

## 2016-09-20 LAB — CREATININE, SERUM
Creatinine, Ser: 1.17 mg/dL — ABNORMAL HIGH (ref 0.44–1.00)
GFR calc Af Amer: 56 mL/min — ABNORMAL LOW (ref >60)
GFR calc non Af Amer: 49 mL/min — ABNORMAL LOW (ref >60)

## 2016-09-20 MED ORDER — FENTANYL CITRATE (PF) 100 MCG/2ML IJ SOLN
INTRAMUSCULAR | Status: AC
  Filled 2016-09-20: qty 2

## 2016-09-20 MED ORDER — IOPAMIDOL (ISOVUE-300) INJECTION 61%: 10.0000 mL | Freq: Once | INTRAVENOUS | Status: DC | PRN

## 2016-09-20 MED ORDER — MIDAZOLAM HCL 5 MG/5ML IJ SOLN
INTRAMUSCULAR | Status: AC | PRN
  Administered 2016-09-20: 0.5 mg via INTRAVENOUS

## 2016-09-20 MED ORDER — SODIUM CHLORIDE 0.9 % IV SOLN
INTRAVENOUS | Status: DC
  Administered 2016-09-20: 13:00:00 via INTRAVENOUS

## 2016-09-20 MED ORDER — CEFAZOLIN SODIUM-DEXTROSE 2-4 GM/100ML-% IV SOLN
2.0000 g | Freq: Once | INTRAVENOUS | Status: AC
  Administered 2016-09-20: 2 g via INTRAVENOUS
  Filled 2016-09-20 (×2): qty 100

## 2016-09-20 MED ORDER — ENOXAPARIN SODIUM 150 MG/ML ~~LOC~~ SOLN: 1.0000 mg/kg | Freq: Two times a day (BID) | SUBCUTANEOUS | 0 refills | Status: DC

## 2016-09-20 MED ORDER — LIDOCAINE HCL (PF) 1 % IJ SOLN
INTRAMUSCULAR | Status: AC | PRN
  Administered 2016-09-20: 2 mL

## 2016-09-20 MED ORDER — FENTANYL CITRATE (PF) 100 MCG/2ML IJ SOLN
INTRAMUSCULAR | Status: AC | PRN
  Administered 2016-09-20: 25 ug via INTRAVENOUS

## 2016-09-20 MED ORDER — MORPHINE SULFATE ER 30 MG PO TBCR: 30.0000 mg | EXTENDED_RELEASE_TABLET | Freq: Two times a day (BID) | ORAL | 0 refills | Status: DC

## 2016-09-20 MED ORDER — ENSURE ENLIVE PO LIQD: 237.0000 mL | ORAL | Status: DC

## 2016-09-20 MED ORDER — MIDAZOLAM HCL 5 MG/5ML IJ SOLN
INTRAMUSCULAR | Status: AC
  Filled 2016-09-20: qty 5

## 2016-09-20 NOTE — Telephone Encounter (Signed)
Called 1-C and spoke to Holiday, RN who is taking care of patient today.  Per Dr. Mike Gip, this should be decided by Dr. Erlene Quan.  Recommend consult with her.  Creatinine did improve with tube.  Per Megan patient is in [place] now to have tube removed.  Will be returning to [facility] to have another one placed.  Consult in with Dr. Erlene Quan also, per [facility].

## 2016-09-20 NOTE — Procedures (Signed)
Interventional Radiology Procedure Note  Procedure: Replacement of right percutaneous nephrostomy tube  Complications: None  Estimated Blood Loss: None  Percutaneous nephrostomy tract recanalized initially with 5 Fr catheter over hydrophilic guidewire. Tract dilated and new 10 Fr PCN advanced and formed in renal pelvis. Secured with retention suture and Stat-Lock device.  Attached to gravity bag. OK to discharge home today if no other reason to keep patient in hospital.  Eulas Post T. Kathlene Cote, M.D Pager:  [phone]

## 2016-09-20 NOTE — Progress Notes (Signed)
Nutrition Follow-up  DOCUMENTATION CODES:   Severe malnutrition in context of chronic illness  INTERVENTION:  Provide Ensure Enlive po once daily, each supplement provides 350 kcal and 20 grams of protein. Discussed with RN to offer to patient daily even though she is refusing as it is needed in setting of ongoing weight loss and chronic malnutrition.  NUTRITION DIAGNOSIS:   Malnutrition related to chronic illness as evidenced by severe depletion of muscle mass, severe depletion of body fat.  Ongoing.  GOAL:   Patient will meet greater than or equal to 90% of their needs  Met with calories, progressing with protein.  MONITOR:   I & O's, Labs, Weight trends, PO intake  REASON FOR ASSESSMENT:   Malnutrition Screening Tool    ASSESSMENT:   Sandra Landry  is a 66 y.o. female with a known history of Urothelial cancer stage IV, COPD, asthma on chemotherapy for his to the emergency room complaining of 2 days of left leg swelling. No shortness of breath or chest pain  Spoke with patient at bedside. She reports she has a good appetite. Denies N/V or abdominal pain. Patient NPO today pending replacement of nephrostomy tube. When RD walked into room patient was attempting to eat a candy bar. RD informed patient she was to remain NPO and should not eat (also informed RN). Discussed with patient that she has increased needs for calories and protein and although she is eating well it is not enough as she is still losing weight. Patient refused ONS or regularly ordered snacks as recommended by RD.  Meal Completion: 100% for the two meals recorded since 1/2. On 12/31 was only 10-40%.  In the last 24 hours patient has had approximately 2056 kcal (100% estimated kcal needs) and 64 grams of protein (79% minimum estimated protein needs).   Medications reviewed and include: cefazolin, Colace, famotidine, lactulose, Megace 200 mg daily, morphine 30 mg BID, potassium chloride 20 mEq daily.   Labs  reviewed: Creatinine 1.17, EGFR 49.   Weight trend: 60.3 kg on 1/4 (-2.4 kg from admission)  Discussed with RN.   Diet Order:  Diet NPO time specified  Skin:  Reviewed, no issues  Last BM:  09/19/2016  Height:   Ht Readings from Last 1 Encounters:  09/16/16 _0  (1.676 m)    Weight:   Wt Readings from Last 1 Encounters:  09/20/16 133 lb (60.3 kg)    Ideal Body Weight:  59.09 kg  BMI:  Body mass index is 21.47 kg/m.  Estimated Nutritional Needs:   Kcal:  1881-2200 calories  Protein:  81-107 gm  Fluid:  >/= 1.8L  EDUCATION NEEDS:   No education needs identified at this time  Sandra Blade, MS, RD, LDN Pager: [phone] After Hours Pager: [phone]

## 2016-09-20 NOTE — Progress Notes (Signed)
Called by nursing to inform me that right nephrostomy tube has become dislodged sometime tonight.   Tube was placed by IR on 08/31/16.  Placed consult for IR to replace tube in AM, made patient NPO.  Also placed consult to let Dr. Erlene Quan know about this complication as she had ordered the tube.   Monitor closely.

## 2016-09-20 NOTE — Telephone Encounter (Signed)
Patty called from [facility] stating that pt was due to be D/C today but they are doing [place] on her nephrostomy tube to see if it needs possible replacement. They want to know if Dr. Mike Gip feels she would still need a nephrostomy tube however since she is not taking chemo. Please call oncology floor and let them know if nephrostomy tube is still needed or not. Thanks.

## 2016-09-20 NOTE — Progress Notes (Signed)
Pt was discharged today, Iv x1 removed and port was deaccessed. Discharge instructions given to patient, she verified understanding. 2 paper prescriptions given to patient. All belongings packed and returned to patient. She was rolled out in a wheelchair by staff.

## 2016-09-20 NOTE — Discharge Instructions (Signed)
Resume diet and activity as before ° ° °

## 2016-09-24 ENCOUNTER — Ambulatory Visit
Admission: RE | Admit: 2016-09-24 | Discharge: 2016-09-24 | Disposition: A | Discharge status: Home / Self Care | Payer: 59 | Source: Ambulatory Visit | Attending: Radiation Oncology | Admitting: Radiation Oncology

## 2016-09-24 ENCOUNTER — Other Ambulatory Visit: Payer: Self-pay | Admitting: Hematology and Oncology

## 2016-09-24 DIAGNOSIS — C661 Malignant neoplasm of right ureter: Secondary | ICD-10-CM | POA: Diagnosis not present

## 2016-09-24 DIAGNOSIS — Z510 Encounter for antineoplastic radiation therapy: Secondary | ICD-10-CM | POA: Diagnosis not present

## 2016-09-25 ENCOUNTER — Inpatient Hospital Stay: Payer: 59 | Admitting: Hematology and Oncology

## 2016-09-25 ENCOUNTER — Inpatient Hospital Stay: Payer: 59

## 2016-09-27 ENCOUNTER — Other Ambulatory Visit: Payer: Self-pay | Admitting: *Deleted

## 2016-09-27 DIAGNOSIS — Z510 Encounter for antineoplastic radiation therapy: Secondary | ICD-10-CM | POA: Diagnosis not present

## 2016-09-27 DIAGNOSIS — C661 Malignant neoplasm of right ureter: Secondary | ICD-10-CM

## 2016-09-27 NOTE — Discharge Summary (Addendum)
Sandra Landry at [place] NAME: Sandra Landry    MR#:  [id number]  DATE OF BIRTH:  23-Jan-1951  DATE OF ADMISSION:  09/15/2016 ADMITTING PHYSICIAN: Hillary Bow, MD  DATE OF DISCHARGE: 09/20/2016  5:09 PM  PRIMARY CARE PHYSICIAN: BLISS, Lynnell Jude, MD   ADMISSION DIAGNOSIS:  Swelling [R60.9] Acute deep vein thrombosis (DVT) of distal vein of left lower extremity (HCC) [I82.4Z2]  DISCHARGE DIAGNOSIS:  Active Problems:   Anemia   Left leg DVT (HCC)   Acute deep vein thrombosis (DVT) of distal vein of left lower extremity ([place])   SECONDARY DIAGNOSIS:   Past Medical History:  Diagnosis Date  . Anxiety   . Arthritis   . Asthma    mild  . Cancer (HCC)    urothelia  . Cervical disc disease   . COPD (chronic obstructive pulmonary disease) (HCC)    mild  . Depression   . Dysrhythmia    tachycardia  . Fibromyalgia   . GERD (gastroesophageal reflux disease)   . Hepatitis C   . History of hiatal hernia   . Intracerebral hemorrhage ([place])      ADMITTING HISTORY  Sandra Landry  is a 66 y.o. female with a known history of Urothelial cancer stage IV, COPD, asthma on chemotherapy for his to the emergency room complaining of 2 days of left leg swelling. No shortness of breath or chest pain. Lower extremity Dopplers showed extensive left lower extremity DVT extending from common femoral vein down to the leg veins. No prior history of DVT. She does have pancytopenia with drop in her hemoglobin and platelets. And is being admitted on heparin drip for possible IVC filter and monitoring of her platelets and hemoglobin. Blood in stool, melena or bloody urine.  HOSPITAL COURSE:   66 year old female with past medical history significant for stage IV urothelial cancer on chemotherapy, COPD, arthritis presents to hospital secondary to left leg DVT.  # left lower extremity DVT-currently on anticoagulation with heparin drip. -History of subarachnoid  hemorrhage 2 months ago, but according to neurosurgeon, can be anticoagulated.  Patient was treated with IV heparin in the hospital with close monitoring of hemoglobin, platelets. No complications. - IVC filter placed 09/20/2015 - Stopped heparin and started Lovenox. DC home on subcutaneous Lovenox. Discussed with Dr. Mike Gip her hematology oncologist.  # Acute on chronic anemia-secondary to chemotherapy -No active bleeding noted. Received 2 units prbc and hb stable   # Acute on chronic thrombocytopenia- secondary to chemotherapy dropping platelets as outpatient requiring transfusion.  Due to recent subarachnoid hemorrhage, plan is to keep the platelets greater than 50 K. -1 unit platelets - 09/18/2015 This is improving at this time. Platelets have trended up to 70,000.  # Stage IV urothelial cancer  Patient is currently on chemotherapy-received 2 cycles so far.  # COPD-stable.  # Hypokalemia-replaced  # chronic pain syndrome On OxyContin and Percocet. Increased dose of OxyContin and prescription given.  # Patient had her nephrostomy to come out on the day of discharge. Interventional radiology was consulted and replace the nephrostomy tube. No complications or bleeding. Good urine output.  Stable for discharge home.  CONSULTS OBTAINED:  Treatment Team:  Gladstone Lighter, MD Lequita Asal, MD Katha Cabal, MD  DRUG ALLERGIES:   Allergies  Allergen Reactions  . Other Swelling    Dust, ragweed  . Gabapentin Other (See Comments)    Jerking motions and confusion.  . Iodides   . Tetanus Toxoids  Other (See Comments)    Localized pain and swelling  . Iodine Other (See Comments) and Rash    Limited history. Occurred in 78s, at age 33-12, received contrast at public health facility in [place], while being evaluated for meningitis. Immediate rash - not itchy, "flat, red, perfect circles" over entire body. Has not received iodine contrast since.    DISCHARGE  MEDICATIONS:   Discharge Medication List as of 09/20/2016  3:59 PM    START taking these medications   Details  enoxaparin (LOVENOX) 150 MG/ML injection Inject 0.41 mLs (60 mg total) into the skin every 12 (twelve) hours., Starting Thu 09/20/2016, Print      CONTINUE these medications which have CHANGED   Details  morphine (MS CONTIN) 30 MG 12 hr tablet Take 1 tablet (30 mg total) by mouth 2 (two) times daily., Starting Thu 09/20/2016, Print      CONTINUE these medications which have NOT CHANGED   Details  baclofen (LIORESAL) 20 MG tablet Take 20 mg by mouth 4 (four) times daily as needed for muscle spasms. , Starting Mon 04/09/2016, Historical Med    bisacodyl (DULCOLAX) 5 MG EC tablet Take 5 mg by mouth daily as needed for moderate constipation., Historical Med    diltiazem (CARDIZEM CD) 120 MG 24 hr capsule Take 240 mg by mouth at bedtime. , Historical Med    diphenhydrAMINE (BENADRYL) 25 MG tablet Take 25-50 mg by mouth every 6 (six) hours as needed for allergies. , Historical Med    escitalopram (LEXAPRO) 20 MG tablet Take 40 mg by mouth at bedtime. , Starting Thu 04/26/2016, Historical Med    Iron-Vitamin C (VITRON-C PO) Take 1 tablet by mouth daily., Historical Med    lactulose (CHRONULAC) 10 GM/15ML solution Take 10 g by mouth 2 (two) times daily. , Starting Thu 06/07/2016, Historical Med    lidocaine-prilocaine (EMLA) cream Apply cream to port site 1 hour prior to chemotherapy, place small amount of saran wrap to cover cream and protect clothing., Normal    megestrol (MEGACE) 400 MG/10ML suspension Take 5 mLs (200 mg total) by mouth daily., Starting Thu 07/26/2016, Normal    NEOMYCIN-POLYMYXIN-HYDROCORTISONE (CORTISPORIN) 1 % SOLN otic solution INSTILL 2 DROPS TO AFFECTED EAR 4 TIMES DAILY, Historical Med    oxyCODONE-acetaminophen (PERCOCET/ROXICET) 5-325 MG tablet Take 1 tablet by mouth every 4 (four) hours as needed for moderate pain or severe pain. , Starting Tue 06/05/2016,  Historical Med    polyethylene glycol (MIRALAX / GLYCOLAX) packet Take 17 g by mouth daily as needed for mild constipation. , Starting Mon 05/07/2016, Historical Med    potassium chloride (K-DUR) 10 MEQ tablet Take 1 tablet (10 mEq total) by mouth daily., Starting Tue 07/24/2016, Until Sun 09/15/2017, Normal    prochlorperazine (COMPAZINE) 5 MG/ML injection Inject 10 mg into the muscle every 4 (four) hours as needed (nausea). , Historical Med    promethazine (PHENERGAN) 25 MG suppository Place 25 mg rectally every 6 (six) hours as needed for nausea or vomiting. , Starting Mon 02/20/2016, Historical Med    ranitidine (ZANTAC) 300 MG tablet Take 300 mg by mouth 2 (two) times daily. , Starting Tue 03/08/2015, Historical Med    dexamethasone (DECADRON) 4 MG tablet Take 2 tablets by mouth once a day on the day after cisplatin chemotherapy and then take 2 tablets two times a day for 2 days. Take with food., Print    LORazepam (ATIVAN) 0.5 MG tablet Take 1 tablet (0.5 mg total) by mouth  every 6 (six) hours as needed (Nausea or vomiting)., Starting Mon 07/30/2016, Print    ondansetron (ZOFRAN) 8 MG tablet Take 1 tablet (8 mg total) by mouth 2 (two) times daily as needed. Start on the third day after cisplatin chemotherapy., Starting Mon 07/30/2016, Normal        Today   VITAL SIGNS:  Blood pressure (!) 98/57, pulse 72, temperature 98.5 F (36.9 C), resp. rate 14, height 5\' 6"  (1.676 m), weight 60.3 kg (133 lb), SpO2 99 %.  I/O:  No intake or output data in the 24 hours ending 09/27/16 1350  PHYSICAL EXAMINATION:  Physical Exam  GENERAL:  66 y.o.-year-old patient lying in the bed with no acute distress.  LUNGS: Normal breath sounds bilaterally, no wheezing, rales,rhonchi or crepitation. No use of accessory muscles of respiration.  CARDIOVASCULAR: S1, S2 normal. No murmurs, rubs, or gallops.  ABDOMEN: Soft, non-tender, non-distended. Bowel sounds present. No organomegaly or mass.  NEUROLOGIC:  Moves all 4 extremities. PSYCHIATRIC: The patient is alert and oriented x 3.  SKIN: No obvious rash, lesion, or ulcer.   DATA REVIEW:   CBC No results for input(s): WBC, HGB, HCT, PLT in the last 168 hours.  Chemistries  No results for input(s): NA, K, CL, CO2, GLUCOSE, BUN, CREATININE, CALCIUM, MG, AST, ALT, ALKPHOS, BILITOT in the last 168 hours.  Invalid input(s): GFRCGP  Cardiac Enzymes No results for input(s): TROPONINI in the last 168 hours.  Microbiology Results  Results for orders placed or performed in visit on 08/06/16  Urine culture     Status: Abnormal   Collection Time: 08/06/16  3:17 PM  Result Value Ref Range Status   Specimen Description URINE, CLEAN CATCH  Final   Special Requests NONE  Final   Culture (A)  Final    <10,000 COLONIES/mL INSIGNIFICANT GROWTH Performed at [facility]    Report Status 08/07/2016 FINAL  Final  Urine culture     Status: Abnormal   Collection Time: 08/06/16  3:17 PM  Result Value Ref Range Status   Specimen Description URINE, RANDOM  Final   Special Requests   Final    RIGHT NEPHROSTOMY Performed at [facility]    Culture >=100,000 COLONIES/mL STENOTROPHOMONAS MALTOPHILIA (A)  Final   Report Status 08/08/2016 FINAL  Final   Organism ID, Bacteria STENOTROPHOMONAS MALTOPHILIA (A)  Final      Susceptibility   Stenotrophomonas maltophilia - MIC*    LEVOFLOXACIN 2 SENSITIVE Sensitive     TRIMETH/SULFA 80 RESISTANT Resistant     * >=100,000 COLONIES/mL STENOTROPHOMONAS MALTOPHILIA    RADIOLOGY:  No results found.  Follow up with PCP in 1 week.  Management plans discussed with the patient, family and they are in agreement.  CODE STATUS:  Code Status History    Date Active Date Inactive Code Status Order ID Comments User Context   09/15/2016  6:21 PM 09/20/2016  8:14 PM DNR [id number]  Hillary Bow, MD ED   05/30/2016  3:30 PM 05/31/2016  3:55 PM Full Code [id number]  Hillary Bow, MD ED    Questions for Most  Recent Historical Code Status (Order [id number])    Question Answer Comment   In the event of cardiac or respiratory ARREST Do not call a "code blue"    In the event of cardiac or respiratory ARREST Do not perform Intubation, CPR, defibrillation or ACLS    In the event of cardiac or respiratory ARREST Use medication by any route, position, wound care, and  other measures to relive pain and suffering. May use oxygen, suction and manual treatment of airway obstruction as needed for comfort.         Advance Directive Documentation   Flowsheet Row Most Recent Value  Type of Advance Directive  Healthcare Power of Attorney, Living will  Pre-existing out of facility DNR order (yellow form or pink MOST form)  No data  "MOST" Form in Place?  No data      TOTAL TIME TAKING CARE OF THIS PATIENT ON DAY OF DISCHARGE: more than 30 minutes.   Hillary Bow R M.D on 09/27/2016 at 1:50 PM  Between 7am to 6pm - Pager - [phone]  After 6pm go to [email or web address] - password [email or web address] [facility]  Office  [phone]  CC: Primary care physician; Lynnell Jude, MD  Note: This dictation was prepared with Dragon dictation along with smaller phrase technology. Any transcriptional errors that result from this process are unintentional.

## 2016-09-30 ENCOUNTER — Other Ambulatory Visit: Payer: Self-pay | Admitting: Hematology and Oncology

## 2016-10-01 ENCOUNTER — Other Ambulatory Visit: Payer: 59

## 2016-10-01 ENCOUNTER — Telehealth: Payer: Self-pay

## 2016-10-01 ENCOUNTER — Ambulatory Visit: Payer: 59 | Admitting: Hematology and Oncology

## 2016-10-01 NOTE — Telephone Encounter (Signed)
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 Patient recently admitted with DVT after chemotherapy.  Currently not on chemotherapy and currently undergoing radiation.    Spoke with partner Patty via phone this pm.  Chong Sicilian reports appetite is much improved after stopping chemotherapy and starting megace.  Just a few days ago stopped megace due to such increase in appetite.  Patty reports patient eating every hour.  Patty reports chemotherapy really debilitated patient as she was sleeping 20 hour per day, not eating.  Patty feels that weight has increased but will know more after visit with Dr Mike Gip later this week.  Patty reports typically has been eating 2 eggs and 1 1/2 pieces of toast with coffee for breakfast has caregiver during the day with patient as Chong Sicilian still works. Reports for dinner will cook meal of meat and veggies that patient will eat.  Patient eating butter pecan ice cream as well and snacks during the day.   Nutrition Focused Physical Exam: deferred as phone visit  Medications: reviewed, recently stopped megace  Labs: reviewed  Anthropometrics:   Height: 66 inches Weight: 133 lb  UBW: 168 lb in 2017 per Patty  Patty reports lowest weight of 126 pounds (12/26) BMI: 21   Estimated Energy Needs  Kcals: 1800-2100 calories/d Protein: 72-90 g/d Fluid: 2.1 L/d  NUTRITION DIAGNOSIS: Unintentional weight loss related to cancer and cancer related treatments (chemotherapy) as evidenced by weight loss and poor po intake. This is resolving as patient eating more per caregiver as have stopped megace and weight gain of 7 pounds in the 3 weeks per chart.   MALNUTRITION DIAGNOSIS: unable to determine   INTERVENTION:   Encouraged continued intake of small frequent meals and high calorie, high protein foods  with partner Patty to increase weight.   Patient does not really like oral nutrition supplements but has taken them when masked with ice cream.  Discussed other ways to flavor supplements. Offered nutrition visit and declined at this time.  Patty available of service and will call with appointment if needed.      MONITORING, EVALUATION, GOAL: Patient will consume adequate calories and protein to prevent further weight loss   NEXT VISIT: Patty to call if appointment needed.  Emigdio Wildeman B. Zenia Resides, [place], [place] (pager)

## 2016-10-02 ENCOUNTER — Inpatient Hospital Stay: Payer: 59 | Admitting: Hematology and Oncology

## 2016-10-02 ENCOUNTER — Inpatient Hospital Stay: Payer: 59

## 2016-10-03 ENCOUNTER — Other Ambulatory Visit: Payer: 59

## 2016-10-03 ENCOUNTER — Ambulatory Visit: Payer: 59

## 2016-10-03 ENCOUNTER — Ambulatory Visit: Payer: 59 | Admitting: Hematology and Oncology

## 2016-10-04 ENCOUNTER — Ambulatory Visit: Payer: 59

## 2016-10-05 ENCOUNTER — Ambulatory Visit: Payer: 59 | Attending: Radiation Oncology

## 2016-10-05 ENCOUNTER — Ambulatory Visit
Admission: RE | Admit: 2016-10-05 | Discharge: 2016-10-05 | Disposition: A | Discharge status: Home / Self Care | Payer: 59 | Source: Ambulatory Visit | Attending: Radiation Oncology | Admitting: Radiation Oncology

## 2016-10-05 DIAGNOSIS — Z510 Encounter for antineoplastic radiation therapy: Secondary | ICD-10-CM | POA: Diagnosis not present

## 2016-10-08 ENCOUNTER — Inpatient Hospital Stay (HOSPITAL_BASED_OUTPATIENT_CLINIC_OR_DEPARTMENT_OTHER): Payer: 59 | Admitting: Hematology and Oncology

## 2016-10-08 ENCOUNTER — Ambulatory Visit: Payer: 59 | Attending: Radiation Oncology

## 2016-10-08 ENCOUNTER — Other Ambulatory Visit: Payer: Self-pay | Admitting: *Deleted

## 2016-10-08 ENCOUNTER — Inpatient Hospital Stay: Payer: 59

## 2016-10-08 VITALS — BP 114/72 | HR 99 | Temp 95.8°F | Resp 18 | Wt 138.0 lb

## 2016-10-08 DIAGNOSIS — Z79899 Other long term (current) drug therapy: Secondary | ICD-10-CM

## 2016-10-08 DIAGNOSIS — J45909 Unspecified asthma, uncomplicated: Secondary | ICD-10-CM | POA: Diagnosis not present

## 2016-10-08 DIAGNOSIS — Z8619 Personal history of other infectious and parasitic diseases: Secondary | ICD-10-CM

## 2016-10-08 DIAGNOSIS — F329 Major depressive disorder, single episode, unspecified: Secondary | ICD-10-CM

## 2016-10-08 DIAGNOSIS — Z9221 Personal history of antineoplastic chemotherapy: Secondary | ICD-10-CM | POA: Diagnosis not present

## 2016-10-08 DIAGNOSIS — D649 Anemia, unspecified: Secondary | ICD-10-CM

## 2016-10-08 DIAGNOSIS — C661 Malignant neoplasm of right ureter: Secondary | ICD-10-CM

## 2016-10-08 DIAGNOSIS — J449 Chronic obstructive pulmonary disease, unspecified: Secondary | ICD-10-CM | POA: Diagnosis not present

## 2016-10-08 DIAGNOSIS — F419 Anxiety disorder, unspecified: Secondary | ICD-10-CM

## 2016-10-08 DIAGNOSIS — K589 Irritable bowel syndrome without diarrhea: Secondary | ICD-10-CM

## 2016-10-08 DIAGNOSIS — C786 Secondary malignant neoplasm of retroperitoneum and peritoneum: Secondary | ICD-10-CM

## 2016-10-08 DIAGNOSIS — I4891 Unspecified atrial fibrillation: Secondary | ICD-10-CM

## 2016-10-08 DIAGNOSIS — Z923 Personal history of irradiation: Secondary | ICD-10-CM | POA: Diagnosis not present

## 2016-10-08 DIAGNOSIS — R1031 Right lower quadrant pain: Secondary | ICD-10-CM

## 2016-10-08 DIAGNOSIS — Z7901 Long term (current) use of anticoagulants: Secondary | ICD-10-CM | POA: Diagnosis not present

## 2016-10-08 DIAGNOSIS — Z86010 Personal history of colonic polyps: Secondary | ICD-10-CM

## 2016-10-08 DIAGNOSIS — N1330 Unspecified hydronephrosis: Secondary | ICD-10-CM | POA: Diagnosis not present

## 2016-10-08 DIAGNOSIS — Z86718 Personal history of other venous thrombosis and embolism: Secondary | ICD-10-CM | POA: Diagnosis not present

## 2016-10-08 DIAGNOSIS — R634 Abnormal weight loss: Secondary | ICD-10-CM

## 2016-10-08 DIAGNOSIS — M129 Arthropathy, unspecified: Secondary | ICD-10-CM | POA: Diagnosis not present

## 2016-10-08 DIAGNOSIS — K219 Gastro-esophageal reflux disease without esophagitis: Secondary | ICD-10-CM

## 2016-10-08 DIAGNOSIS — M5030 Other cervical disc degeneration, unspecified cervical region: Secondary | ICD-10-CM

## 2016-10-08 DIAGNOSIS — M797 Fibromyalgia: Secondary | ICD-10-CM

## 2016-10-08 DIAGNOSIS — M7989 Other specified soft tissue disorders: Secondary | ICD-10-CM

## 2016-10-08 DIAGNOSIS — G893 Neoplasm related pain (acute) (chronic): Secondary | ICD-10-CM

## 2016-10-08 DIAGNOSIS — D72829 Elevated white blood cell count, unspecified: Secondary | ICD-10-CM

## 2016-10-08 DIAGNOSIS — Z8701 Personal history of pneumonia (recurrent): Secondary | ICD-10-CM

## 2016-10-08 DIAGNOSIS — I82412 Acute embolism and thrombosis of left femoral vein: Secondary | ICD-10-CM

## 2016-10-08 DIAGNOSIS — Z8781 Personal history of (healed) traumatic fracture: Secondary | ICD-10-CM

## 2016-10-08 DIAGNOSIS — Z87440 Personal history of urinary (tract) infections: Secondary | ICD-10-CM

## 2016-10-08 LAB — CBC WITH DIFFERENTIAL/PLATELET
BASOS ABS: 0.1 10*3/uL (ref 0–0.1)
BASOS PCT: 1 %
EOS ABS: 0.2 10*3/uL (ref 0–0.7)
EOS PCT: 1 %
HCT: 30.5 % — ABNORMAL LOW (ref 35.0–47.0)
Hemoglobin: 10 g/dL — ABNORMAL LOW (ref 12.0–16.0)
Lymphocytes Relative: 14 %
Lymphs Abs: 2.4 10*3/uL (ref 1.0–3.6)
MCH: 28.7 pg (ref 26.0–34.0)
MCHC: 32.9 g/dL (ref 32.0–36.0)
MCV: 87.3 fL (ref 80.0–100.0)
MONO ABS: 1 10*3/uL — AB (ref 0.2–0.9)
Monocytes Relative: 6 %
NEUTROS ABS: 12.8 10*3/uL — AB (ref 1.4–6.5)
Neutrophils Relative %: 78 %
PLATELETS: 192 10*3/uL (ref 150–440)
RBC: 3.49 MIL/uL — ABNORMAL LOW (ref 3.80–5.20)
RDW: 21.4 % — AB (ref 11.5–14.5)
WBC: 16.5 10*3/uL — ABNORMAL HIGH (ref 3.6–11.0)

## 2016-10-08 LAB — COMPREHENSIVE METABOLIC PANEL
ALBUMIN: 3.2 g/dL — AB (ref 3.5–5.0)
ALT: 11 U/L — ABNORMAL LOW (ref 14–54)
AST: 16 U/L (ref 15–41)
Alkaline Phosphatase: 75 U/L (ref 38–126)
Anion gap: 9 (ref 5–15)
BUN: 14 mg/dL (ref 6–20)
CHLORIDE: 104 mmol/L (ref 101–111)
CO2: 21 mmol/L — ABNORMAL LOW (ref 22–32)
Calcium: 9 mg/dL (ref 8.9–10.3)
Creatinine, Ser: 1.11 mg/dL — ABNORMAL HIGH (ref 0.44–1.00)
GFR calc Af Amer: 60 mL/min — ABNORMAL LOW (ref >60)
GFR calc non Af Amer: 52 mL/min — ABNORMAL LOW (ref >60)
GLUCOSE: 110 mg/dL — AB (ref 65–99)
POTASSIUM: 3.8 mmol/L (ref 3.5–5.1)
SODIUM: 134 mmol/L — AB (ref 135–145)
Total Bilirubin: 0.5 mg/dL (ref 0.3–1.2)
Total Protein: 6.7 g/dL (ref 6.5–8.1)

## 2016-10-08 MED ORDER — MORPHINE SULFATE ER 30 MG PO TBCR
30.0000 mg | EXTENDED_RELEASE_TABLET | Freq: Two times a day (BID) | ORAL | 0 refills | Status: DC
Start: 2016-10-08 — End: 2016-11-29

## 2016-10-08 NOTE — Progress Notes (Addendum)
[facility] day:  10/08/2016  Chief Complaint: Sandra Landry is a 66 y.o. female with locally advanced right ureteral cancer who is seen for reassessment after 2 cycles of cisplatin and gemcitabine, interval hospitalization for left lower extremity DVT, and ongoing palliative radiation.  HPI: The patient was last seen in the medical oncology clinic on 08/29/2016.  At that time, we discussed her thoughts about therapy.  She wished to continue chemotherapy.  After her appointment images from [facility] became available.  She saw Dr. Janese Banks in my absence on 09/04/2016 and 09/11/2016.  She received cycle #2 cisplatin and gemcitabine.  Gemcitabine was decreased to 800 mg/m2.  She was admitted to [facility] from 09/15/2017 - 09/20/2016 with acute DVT of the distal left lower extremity.  Left lower extremity duplex on 09/15/2016 revealed extensive left lower extremity DVT involving the entire visualized deep veins from the level of the common femoral vein to the calf veins.  Platelet count was 47,000.  She was transfused with 1 unit of platelets on 09/17/2016 to maintain a platelet count > 50,000 secondary to her recent subarachnoid hemorrhage.  An IVC filter was placed on 09/20/2015.  Platelet count improved spontaneously to 70,000 on 09/20/2016.  She was discharged on Lovenox.  During her hospitalization, we discussed palliative radiation and/or Tecentriq.  She underwent nephrostomy tube exchange on 09/20/2016.  She states that radiation starts tomorrow for 28 treatments.  Symptomatically, her weight is up.  She is eating well.  She spends the majority of her day watching TV.  Pain is managed with MS Contin 30 mg BID and Percocet 5/325 two tablets 3x/day.  She feels that her left leg is "huge".  She is on Lovenox 60 mg q 12 hours.  Her nephrostomy site is "not good".   Past Medical History:  Diagnosis Date  . Anxiety   . Arthritis   . Asthma    mild  . Cervical disc  disease   . COPD (chronic obstructive pulmonary disease) (HCC)    mild  . Depression   . Dysrhythmia    tachycardia  . Fibromyalgia   . GERD (gastroesophageal reflux disease)   . Hepatitis C     Past Surgical History:  Procedure Laterality Date  . cervical discectomy with fusion    . COLONOSCOPY  01/2016  . EUS N/A 06/21/2016   Procedure: LOWER ENDOSCOPIC ULTRASOUND (EUS);  Surgeon: Reita Cliche, MD;  Location: [facility] ENDOSCOPY;  Service: Endoscopy;  Laterality: N/A;    No family history on file.  Social History:  reports that she has never smoked. She has never used smokeless tobacco. She reports that she uses drugs, including Marijuana. She reports that she does not drink alcohol.  She lives in [place].  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  She has also worked in the Physicist, medical.  Her partner is Darliss Ridgel, a Marine scientist.  She lives in [place].  Allergies:  Allergies  Allergen Reactions  . Other Swelling    Dust, ragweed  . Iodides   . Tetanus Toxoids Other (See Comments)    Localized pain and swelling  . Iodine Other (See Comments) and Rash    Limited history. Occurred in 7s, at age 51-12, received contrast at public health facility in [place], while being evaluated for meningitis. Immediate rash - not itchy, "flat, red, perfect circles" over entire body. Has not received iodine contrast since.    Current Medications: Current Outpatient Prescriptions  Medication Sig Dispense Refill  . baclofen (LIORESAL) 20 MG tablet Take 20 mg by mouth 4 (four) times daily as needed for muscle spasms.     . cetirizine (ZYRTEC) 10 MG tablet Take 10 mg by mouth daily.     Marland Kitchen diltiazem (CARDIZEM CD) 120 MG 24 hr capsule Take 120 mg by mouth daily.     . diphenhydrAMINE (BENADRYL) 25 MG tablet Take 25-50 mg by mouth every 6 (six) hours as needed.    Marland Kitchen escitalopram (LEXAPRO) 20 MG tablet Take 20 mg by mouth daily.     Marland Kitchen ibuprofen (ADVIL,MOTRIN) 200 MG tablet Take 200-600 mg by mouth  every 6 (six) hours as needed for mild pain or moderate pain.    Marland Kitchen lactulose (CHRONULAC) 10 GM/15ML solution     . oxyCODONE-acetaminophen (PERCOCET/ROXICET) 5-325 MG tablet     . polyethylene glycol (MIRALAX / GLYCOLAX) packet Take 17 g by mouth daily as needed.     . prochlorperazine (COMPAZINE) 5 MG/ML injection Inject 5-10 mg into the vein as directed. Every 4 to 6 hours as needed.    . promethazine (PHENERGAN) 25 MG suppository Place 25 mg rectally as directed. Every 4 to 6 hours as needed    . ranitidine (ZANTAC) 300 MG tablet Take 300 mg by mouth daily.     . traMADol (ULTRAM) 50 MG tablet Take 50-100 mg by mouth every 4 (four) hours as needed.     . potassium chloride (K-DUR) 10 MEQ tablet Take 2 tablets (20 mEq total) by mouth daily. 28 tablet 0  . potassium chloride (MICRO-K) 10 MEQ CR capsule      No current facility-administered medications for this visit.     Review of Systems:  GENERAL:  Fatigue.  No fever or sweats.  Weight up 16 pounds since last visit. PERFORMANCE STATUS (ECOG):  2-3 HEENT:  Decreased vision in left eye since fall (diplopia when eye open).  No runny nose, sore throat, mouth sores or tenderness. Lungs:  No shortness of breath or cough.  No hemoptysis. Cardiac:  No chest pain, palpitations, orthopnea, or PND. GI:  Eating better.  No diarrhea, constipation, melena or hematochezia. GU:  Right urostomy tube.  No urgency, frequency, or dysuria. Musculoskeletal:  Right sided back pain.  No joint pain.  No muscle tenderness. Extremities:  Left leg swelling s/p DVT. Skin:  No rashes or skin changes. Neuro:  No further hallucinations.  Foggy.  No numbness or weakness, balance or coordination issues. Endocrine:  No diabetes, thyroid issues, hot flashes or night sweats. Psych:  Hypersensitive.  PTSD.  Depression.  Anxiety. Pain:  Back pain. Review of systems:  All other systems reviewed and found to be negative.  Physical Exam: BP 114/72 (BP Location: Right Arm,  Patient Position: Sitting)   Pulse 99   Temp (!) 95.8 F (35.4 C)   Resp 18   Wt 138 lb 0.1 oz (62.6 kg)   BMI 22.28 kg/m  GENERAL:  Fatigued appearing woman who is sitting in a wheelchair in the exam room in no acute distress.  MENTAL STATUS:  Alert and oriented to person, place and time. HEAD:  Shaved head.  Normocephalic, atraumatic, face symmetric, no Cushingoid features. EYES:  Pupils equal round and reactive to light and accomodation.  No conjunctivitis or scleral icterus. ENT:  Oropharynx clear without lesion.  No upper teeth.  Tongue normal. Mucous membranes moist.  RESPIRATORY:  Decreased respiratory excursion.  Clear to auscultation without rales, wheezes or rhonchi. CARDIOVASCULAR:  Regular rate and rhythm without murmur, rub or gallop. ABDOMEN:  Soft, nontender with active bowel sounds and no hepatosplenomegaly.   BACK:  Right sided urostomy with dressing in place.  No evidence of infection. SKIN:  No rashes, ulcers or lesions. EXTREMITIES: Left leg edema without erythema.  No skin discoloration or tenderness.  No palpable cords. LYMPH NODES: No palpable cervical, supraclavicular, axillary or inguinal adenopathy  NEUROLOGICAL:  Appropriate. PSYCH:  Appropriate.   Appointment on 10/08/2016  Component Date Value Ref Range Status  . WBC 10/08/2016 16.5* 3.6 - 11.0 K/uL Final  . RBC 10/08/2016 3.49* 3.80 - 5.20 MIL/uL Final  . Hemoglobin 10/08/2016 10.0* 12.0 - 16.0 g/dL Final  . HCT 10/08/2016 30.5* 35.0 - 47.0 % Final  . MCV 10/08/2016 87.3  80.0 - 100.0 fL Final  . MCH 10/08/2016 28.7  26.0 - 34.0 pg Final  . MCHC 10/08/2016 32.9  32.0 - 36.0 g/dL Final  . RDW 10/08/2016 21.4* 11.5 - 14.5 % Final  . Platelets 10/08/2016 192  150 - 440 K/uL Final  . Neutrophils Relative % 10/08/2016 78  % Final  . Neutro Abs 10/08/2016 12.8* 1.4 - 6.5 K/uL Final  . Lymphocytes Relative 10/08/2016 14  % Final  . Lymphs Abs 10/08/2016 2.4  1.0 - 3.6 K/uL Final  . Monocytes Relative  10/08/2016 6  % Final  . Monocytes Absolute 10/08/2016 1.0* 0.2 - 0.9 K/uL Final  . Eosinophils Relative 10/08/2016 1  % Final  . Eosinophils Absolute 10/08/2016 0.2  0 - 0.7 K/uL Final  . Basophils Relative 10/08/2016 1  % Final  . Basophils Absolute 10/08/2016 0.1  0 - 0.1 K/uL Final  . Sodium 10/08/2016 134* 135 - 145 mmol/L Final  . Potassium 10/08/2016 3.8  3.5 - 5.1 mmol/L Final  . Chloride 10/08/2016 104  101 - 111 mmol/L Final  . CO2 10/08/2016 21* 22 - 32 mmol/L Final  . Glucose, Bld 10/08/2016 110* 65 - 99 mg/dL Final  . BUN 10/08/2016 14  6 - 20 mg/dL Final  . Creatinine, Ser 10/08/2016 1.11* 0.44 - 1.00 mg/dL Final  . Calcium 10/08/2016 9.0  8.9 - 10.3 mg/dL Final  . Total Protein 10/08/2016 6.7  6.5 - 8.1 g/dL Final  . Albumin 10/08/2016 3.2* 3.5 - 5.0 g/dL Final  . AST 10/08/2016 16  15 - 41 U/L Final  . ALT 10/08/2016 11* 14 - 54 U/L Final  . Alkaline Phosphatase 10/08/2016 75  38 - 126 U/L Final  . Total Bilirubin 10/08/2016 0.5  0.3 - 1.2 mg/dL Final  . GFR calc non Af Amer 10/08/2016 52* >60 mL/min Final  . GFR calc Af Amer 10/08/2016 60* >60 mL/min Final   Comment: (NOTE) The eGFR has been calculated using the CKD EPI equation. This calculation has not been validated in all clinical situations. eGFR's persistently <60 mL/min signify possible Chronic Kidney Disease.   . Anion gap 10/08/2016 9  5 - 15 Final    No visits with results within 3 Day(s) from this visit.  Latest known visit with results is:  Admission on 06/21/2016, Discharged on 06/21/2016  Component Date Value Ref Range Status  . SURGICAL PATHOLOGY 06/27/2016    Final                   Value:Surgical Pathology CASE: [id number] PATIENT: Orvella Caver Surgical Pathology Report     SPECIMEN SUBMITTED: A. Recto-sigmoid mass; cbx  CLINICAL HISTORY: None provided  PRE-OPERATIVE DIAGNOSIS: Right pelvic retroperitoneal  mass.  Needs biopsy  POST-OPERATIVE DIAGNOSIS: Invasive peri-rectal  mass     DIAGNOSIS: A. RECTOSIGMOID MASS; COLD BIOPSY: - UROTHELIAL CARCINOMA UNDERMINING COLONIC MUCOSA.  Comment:  A limited panel of immunohistochemical stains was performed. The neoplastic cells have the following immunoreactivity: Super pancytokeratin: Positive p40: Positive GATA-3: Positive SOX-1: Negative CD45: Negative Vimentin: Negative DOG-1: Negative PAX-8: Negative CDX-2: Negative p16: Negative (high background) These findings support the above diagnosis. These results were communicated to Dr. Mike Gip on 06/27/2016. Stain controls worked appropriately.  GROSS DESCRIPTION: A. Labeled: rectosigmoid mass C BX Tissue fragment(s): multiple S                         ize: aggregate, 0.8 x 0.6 x 0.1 cm Description: tan fragments  Entirely submitted in 1 cassette(s).  Final Diagnosis performed by Quay Burow, MD.  Electronically signed 06/27/2016 4:00:48PM    The electronic signature indicates that the named Attending Pathologist has evaluated the specimen  Technical component performed at [facility], [address] [place], [place] [address] Lab: [phone] Dir: Darrick Penna. Evette Doffing, MD  Professional component performed at [facility], [facility], [place], [place], [place] [address] Lab: [phone] Dir: Dellia Nims. Reuel Derby, MD      Assessment:  Zahraa Bhargava is a 66 y.o. 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  She has a history of lower abdomen for 2 years felt secondary to irritable bowel syndrome.  She has had RLQ pain which has extended to her right buttock and hip for 3 months.  She has lost 38 pounds in the past year.  Lower endoscopic ultrasound on 06/21/2016 revealed an infiltrative 5.6 x 4.5 cm non-obstructing large mass in the recto-sigmoid colon extendiing from 14 cm to 18 cm from the anal verge. The mass was non-circumferential.  Biopsies revealed a urothelial carcinoma.  Pathologic stage was  T4NxM0.  Abdominal and pelvic CT scan on 05/14/2016 revealed a 5.7 cm right lower quadrant retroperitoneal mass highly concerning for malignancy. The mass involved the sigmoid colon but is favored to reflect a primary retroperitoneal tumor (such as sarcoma) over a primary colonic mass.  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  There were multiple subcentimeter liver lesions, indeterminate.   PET scan on 06/06/2016 revealed hypermetabolism (SUV 19.7)  corresponding to the right lower quadrant retroperitoneal mass. It was felt intimately associated with the rectosigmoid colon, but originating outside of the colon. There was similar right-sided hydroureteronephrosis due to the right lower quadrant mass.  There was a 4 mm nodule in the left lower lobe (unclear significance).  Colonoscopy at [facility] on 07/18/2015 revealed 3 adenomatous polyps.  She underwent right nephrostomy tube placement on 07/11/2016.  She underwent nephrostomy tube exchange on 09/20/2016.  She has a normocytic anemia.  Labs on 07/26/2016 revealed a ferritin (284), iron saturation (4%), TIBC 179 (low) B12 (628), folate (7.1), and ESR (54).  She has had some blood from her nephrostomy tube.  She has anemia of chronic disease.  Diet is poor.  She received 1 unit PRBCs on 08/01/2016.  She has leukocytosis secondary to her underlying malignancy.  Work-up on 07/26/2016 revealed a negative urinalysis, blood cultures, and CXR.  Urinalysis revealed 20,000 colonies/ml Klebsiella pneumonia.  She received a course of ciprofloxacin.  Urine culture on 08/06/2016 revealed Stenotrophomonas maltophilia.  She was treated with a course of Levaquin.  Prior to treatment, she lost 35+ pounds.  She  began Megace on 07/26/2016.  She has lost an additional 12 pounds with her recent admission to [facility].  She received 2 cycles of cisplatin and gemcitabine (07/30/2016 - 09/04/2016).  Cisplatin was given as a split  dose (day 1 and 2).  Course was complicated by an admission to [facility] on 08/08/2016 following a fall.  Head MRI revealed a subarachnoid hemorrhage.  She had atrial fibrillation with RVR.  Chest, abdomen, and pelvic CT scan on 08/08/2016 revealed a mild age-indeterminate compression fracture of T6. There was a 6 mm hypodensity at the dome of the liver too small to characterize and a few additional small subcentimeter hypodense scattered lesions in the liver. There was a 7.3 x 6.4 x 7.7 cm right pelvic mass invading the sacrum and causing a lytic lesion with pathologic fracture.  The mass likely invaded the right psoas muscle.  Left lower extremity duplex on 09/15/2016 revealed extensive left lower extremity DVT involving the entire visualized deep veins from the level of the common femoral vein to the calf veins.  An IVC filter was placed on 09/20/2015.  She is on Lovenox.  Symptomatically, she is fatigued.  She spends the majority of her day resting.  She is eating better.   Plan: 1.  Labs today:  CBC with diff, CMP. 2.  Discuss events over last 4-5 weeks.  Discuss management of her DVT.  Platelet count adequate for continuation of Lovenox. 3.  Discus pain management.  Patient wishes to continue with current regimen. 4.  Discuss patiient's thoughts about therapy.  Discuss atezolizumab Gildardo Pounds).  Information provided. 5.  Rx:  MS Contin 30 mg po q 12 hours. 6.  Port flush every 4-6 weeks. 7.  RTC in 2 weeks for MD assessment and labs (CBC with diff, CMP).   Lequita Asal, MD 10/08/2016

## 2016-10-08 NOTE — Patient Instructions (Signed)
Atezolizumab injection What is this medicine? ATEZOLIZUMAB (a te zoe LIZ ue mab) is a monoclonal antibody. It is used to treat bladder cancer (urothelial cancer) and non-small cell lung cancer. COMMON BRAND NAME(S): Tecentriq What should I tell my health care provider before I take this medicine? They need to know if you have any of these conditions: -diabetes -immune system problems -infection -inflammatory bowel disease -liver disease -lung or breathing disease -lupus -nervous system problems like myasthenia gravis or Guillain-Barre syndrome -organ transplant -an unusual or allergic reaction to atezolizumab, other medicines, foods, dyes, or preservatives -pregnant or trying to get pregnant -breast-feeding How should I use this medicine? This medicine is for infusion into a vein. It is given by a health care professional in a hospital or clinic setting. A special MedGuide will be given to you before each treatment. Be sure to read this information carefully each time. Talk to your pediatrician regarding the use of this medicine in children. Special care may be needed. What if I miss a dose? It is important not to miss your dose. Call your doctor or health care professional if you are unable to keep an appointment. What may interact with this medicine? Interactions have not been studied. What should I watch for while using this medicine? Your condition will be monitored carefully while you are receiving this medicine. You may need blood work done while you are taking this medicine. Do not become pregnant while taking this medicine or for at least 5 months after stopping it. Women should inform their doctor if they wish to become pregnant or think they might be pregnant. There is a potential for serious side effects to an unborn child. Talk to your health care professional or pharmacist for more information. Do not breast-feed an infant while taking this medicine or for at least 5 months  after the last dose. What side effects may I notice from receiving this medicine? Side effects that you should report to your doctor or health care professional as soon as possible: -allergic reactions like skin rash, itching or hives, swelling of the face, lips, or tongue -black, tarry stools -bloody or watery diarrhea -breathing problems -changes in vision -chest pain or chest tightness -chills -facial flushing -fever -headache -signs and symptoms of high blood sugar such as dizziness; dry mouth; dry skin; fruity breath; nausea; stomach pain; increased hunger or thirst; increased urination -signs and symptoms of liver injury like dark yellow or brown urine; general ill feeling or flu-like symptoms; light-colored stools; loss of appetite; nausea; right upper belly pain; unusually weak or tired; yellowing of the eyes or skin -stomach pain -trouble passing urine or change in the amount of urine Side effects that usually do not require medical attention (report to your doctor or health care professional if they continue or are bothersome): -cough -diarrhea -joint pain -muscle pain -muscle weakness -tiredness -weight loss Where should I keep my medicine? This drug is given in a hospital or clinic and will not be stored at home.  2017 Elsevier/Gold Standard (2015-10-05 17:54:14)  

## 2016-10-08 NOTE — Progress Notes (Signed)
Patient's left leg is still red and swollen.  She states the thigh area feels tight.  Patient is not sleeping as well.  C/o back pain also.  Appetite is better.

## 2016-10-09 ENCOUNTER — Ambulatory Visit: Payer: 59

## 2016-10-09 DIAGNOSIS — Z510 Encounter for antineoplastic radiation therapy: Secondary | ICD-10-CM | POA: Diagnosis not present

## 2016-10-10 ENCOUNTER — Ambulatory Visit: Payer: 59

## 2016-10-10 DIAGNOSIS — Z510 Encounter for antineoplastic radiation therapy: Secondary | ICD-10-CM | POA: Diagnosis not present

## 2016-10-11 ENCOUNTER — Ambulatory Visit: Payer: 59

## 2016-10-11 ENCOUNTER — Inpatient Hospital Stay: Payer: 59

## 2016-10-12 ENCOUNTER — Ambulatory Visit: Payer: 59

## 2016-10-12 DIAGNOSIS — Z510 Encounter for antineoplastic radiation therapy: Secondary | ICD-10-CM | POA: Diagnosis not present

## 2016-10-15 ENCOUNTER — Telehealth: Payer: Self-pay | Admitting: *Deleted

## 2016-10-15 ENCOUNTER — Ambulatory Visit: Payer: 59

## 2016-10-15 DIAGNOSIS — Z510 Encounter for antineoplastic radiation therapy: Secondary | ICD-10-CM | POA: Diagnosis not present

## 2016-10-15 MED ORDER — OXYCODONE-ACETAMINOPHEN 5-325 MG PO TABS: 1.0000 | ORAL_TABLET | ORAL | 0 refills | Status: DC | PRN

## 2016-10-15 NOTE — Telephone Encounter (Signed)
Asking if Dr Mike Gip will take over writing her Percocet prescription since she is writing for her Morphine. She takes1 every 4 hours and usually gets # 60 which lasts 10 days. Please advise.  She has 10 tabs left.

## 2016-10-15 NOTE — Telephone Encounter (Signed)
Left message on Patty's VM to pick up Rx

## 2016-10-15 NOTE — Telephone Encounter (Signed)
  Sounds fine.  M 

## 2016-10-16 ENCOUNTER — Ambulatory Visit
Admission: RE | Admit: 2016-10-16 | Discharge: 2016-10-16 | Disposition: A | Discharge status: Home / Self Care | Payer: 59 | Source: Ambulatory Visit | Attending: Radiation Oncology | Admitting: Radiation Oncology

## 2016-10-16 DIAGNOSIS — Z510 Encounter for antineoplastic radiation therapy: Secondary | ICD-10-CM | POA: Diagnosis not present

## 2016-10-17 ENCOUNTER — Other Ambulatory Visit: Payer: Self-pay | Admitting: *Deleted

## 2016-10-17 ENCOUNTER — Ambulatory Visit
Admission: RE | Admit: 2016-10-17 | Discharge: 2016-10-17 | Disposition: A | Discharge status: Home / Self Care | Payer: 59 | Source: Ambulatory Visit | Attending: Radiation Oncology | Admitting: Radiation Oncology

## 2016-10-17 DIAGNOSIS — R630 Anorexia: Secondary | ICD-10-CM

## 2016-10-17 DIAGNOSIS — Z510 Encounter for antineoplastic radiation therapy: Secondary | ICD-10-CM | POA: Diagnosis not present

## 2016-10-17 DIAGNOSIS — C661 Malignant neoplasm of right ureter: Secondary | ICD-10-CM

## 2016-10-17 DIAGNOSIS — D649 Anemia, unspecified: Secondary | ICD-10-CM

## 2016-10-17 MED ORDER — MEGESTROL ACETATE 400 MG/10ML PO SUSP: 200.0000 mg | Freq: Every day | ORAL | 0 refills | Status: DC

## 2016-10-18 ENCOUNTER — Inpatient Hospital Stay: Payer: 59 | Attending: Radiation Oncology

## 2016-10-18 ENCOUNTER — Ambulatory Visit
Admission: RE | Admit: 2016-10-18 | Discharge: 2016-10-18 | Disposition: A | Discharge status: Home / Self Care | Payer: 59 | Source: Ambulatory Visit | Attending: Radiation Oncology | Admitting: Radiation Oncology

## 2016-10-18 DIAGNOSIS — F419 Anxiety disorder, unspecified: Secondary | ICD-10-CM | POA: Insufficient documentation

## 2016-10-18 DIAGNOSIS — Z923 Personal history of irradiation: Secondary | ICD-10-CM | POA: Diagnosis not present

## 2016-10-18 DIAGNOSIS — Z79899 Other long term (current) drug therapy: Secondary | ICD-10-CM | POA: Diagnosis not present

## 2016-10-18 DIAGNOSIS — I4891 Unspecified atrial fibrillation: Secondary | ICD-10-CM | POA: Diagnosis not present

## 2016-10-18 DIAGNOSIS — Z8701 Personal history of pneumonia (recurrent): Secondary | ICD-10-CM | POA: Insufficient documentation

## 2016-10-18 DIAGNOSIS — Z8619 Personal history of other infectious and parasitic diseases: Secondary | ICD-10-CM | POA: Diagnosis not present

## 2016-10-18 DIAGNOSIS — J45909 Unspecified asthma, uncomplicated: Secondary | ICD-10-CM | POA: Diagnosis not present

## 2016-10-18 DIAGNOSIS — R066 Hiccough: Secondary | ICD-10-CM | POA: Insufficient documentation

## 2016-10-18 DIAGNOSIS — G893 Neoplasm related pain (acute) (chronic): Secondary | ICD-10-CM | POA: Insufficient documentation

## 2016-10-18 DIAGNOSIS — M129 Arthropathy, unspecified: Secondary | ICD-10-CM | POA: Insufficient documentation

## 2016-10-18 DIAGNOSIS — N1330 Unspecified hydronephrosis: Secondary | ICD-10-CM | POA: Diagnosis not present

## 2016-10-18 DIAGNOSIS — Z86718 Personal history of other venous thrombosis and embolism: Secondary | ICD-10-CM | POA: Insufficient documentation

## 2016-10-18 DIAGNOSIS — C661 Malignant neoplasm of right ureter: Secondary | ICD-10-CM | POA: Diagnosis present

## 2016-10-18 DIAGNOSIS — Z7901 Long term (current) use of anticoagulants: Secondary | ICD-10-CM | POA: Diagnosis not present

## 2016-10-18 DIAGNOSIS — M797 Fibromyalgia: Secondary | ICD-10-CM | POA: Diagnosis not present

## 2016-10-18 DIAGNOSIS — J449 Chronic obstructive pulmonary disease, unspecified: Secondary | ICD-10-CM | POA: Insufficient documentation

## 2016-10-18 DIAGNOSIS — R1903 Right lower quadrant abdominal swelling, mass and lump: Secondary | ICD-10-CM | POA: Diagnosis not present

## 2016-10-18 DIAGNOSIS — M5030 Other cervical disc degeneration, unspecified cervical region: Secondary | ICD-10-CM | POA: Insufficient documentation

## 2016-10-18 DIAGNOSIS — R634 Abnormal weight loss: Secondary | ICD-10-CM | POA: Insufficient documentation

## 2016-10-18 DIAGNOSIS — K219 Gastro-esophageal reflux disease without esophagitis: Secondary | ICD-10-CM | POA: Insufficient documentation

## 2016-10-18 DIAGNOSIS — D638 Anemia in other chronic diseases classified elsewhere: Secondary | ICD-10-CM | POA: Insufficient documentation

## 2016-10-18 DIAGNOSIS — D72829 Elevated white blood cell count, unspecified: Secondary | ICD-10-CM | POA: Insufficient documentation

## 2016-10-18 DIAGNOSIS — Z510 Encounter for antineoplastic radiation therapy: Secondary | ICD-10-CM | POA: Diagnosis not present

## 2016-10-18 DIAGNOSIS — F329 Major depressive disorder, single episode, unspecified: Secondary | ICD-10-CM | POA: Insufficient documentation

## 2016-10-18 DIAGNOSIS — Z8781 Personal history of (healed) traumatic fracture: Secondary | ICD-10-CM | POA: Insufficient documentation

## 2016-10-18 DIAGNOSIS — R1031 Right lower quadrant pain: Secondary | ICD-10-CM | POA: Diagnosis not present

## 2016-10-18 DIAGNOSIS — K589 Irritable bowel syndrome without diarrhea: Secondary | ICD-10-CM | POA: Diagnosis not present

## 2016-10-18 LAB — CBC
HCT: 28.2 % — ABNORMAL LOW (ref 35.0–47.0)
Hemoglobin: 9.4 g/dL — ABNORMAL LOW (ref 12.0–16.0)
MCH: 29.8 pg (ref 26.0–34.0)
MCHC: 33.4 g/dL (ref 32.0–36.0)
MCV: 89.2 fL (ref 80.0–100.0)
PLATELETS: 384 10*3/uL (ref 150–440)
RBC: 3.17 MIL/uL — AB (ref 3.80–5.20)
RDW: 19.1 % — AB (ref 11.5–14.5)
WBC: 30.8 10*3/uL — AB (ref 3.6–11.0)

## 2016-10-19 ENCOUNTER — Ambulatory Visit
Admission: RE | Admit: 2016-10-19 | Discharge: 2016-10-19 | Disposition: A | Discharge status: Home / Self Care | Payer: 59 | Source: Ambulatory Visit | Attending: Radiation Oncology | Admitting: Radiation Oncology

## 2016-10-19 DIAGNOSIS — Z510 Encounter for antineoplastic radiation therapy: Secondary | ICD-10-CM | POA: Diagnosis not present

## 2016-10-22 ENCOUNTER — Ambulatory Visit: Payer: 59

## 2016-10-22 ENCOUNTER — Inpatient Hospital Stay: Payer: 59 | Admitting: Hematology and Oncology

## 2016-10-22 ENCOUNTER — Inpatient Hospital Stay: Payer: 59

## 2016-10-22 NOTE — Progress Notes (Deleted)
[facility]-  Cancer Center  Clinic day:  10/22/2016   Chief Complaint: Sandra Landry is a 66 y.o. female with locally advanced right ureteral cancer who is seen for 2 week assessment during palliative radiation.  HPI: The patient was last seen in the medical oncology clinic on 10/08/2016.  At that time, we she was in the midst of palliative radiation.  discussed her thoughts about therapy.  She wished to continue chemotherapy.  After her appointment images from [facility] became available.  She saw Dr. Smith Robert in my absence on 09/04/2016 and 09/11/2016.  She received cycle #2 cisplatin and gemcitabine.  Gemcitabine was decreased to 800 mg/m2.  She was admitted to [facility] from 09/15/2017 - 09/20/2016 with acute DVT of the distal left lower extremity.  Left lower extremity duplex on 09/15/2016 revealed extensive left lower extremity DVT involving the entire visualized deep veins from the level of the common femoral vein to the calf veins.  Platelet count was 47,000.  She was transfused with 1 unit of platelets on 09/17/2016 to maintain a platelet count > 50,000 secondary to her recent subarachnoid hemorrhage.  An IVC filter was placed on 09/20/2015.  Platelet count improved spontaneously to 70,000 on 09/20/2016.  She was discharged on Lovenox.  During her hospitalization, we discussed palliative radiation and/or Tecentriq.  She underwent nephrostomy tube exchange on 09/20/2016.  She began palliative radiation on 10/05/2016.   Past Medical History:  Diagnosis Date  . Anxiety   . Arthritis   . Asthma    mild  . Cervical disc disease   . COPD (chronic obstructive pulmonary disease) (HCC)    mild  . Depression   . Dysrhythmia    tachycardia  . Fibromyalgia   . GERD (gastroesophageal reflux disease)   . Hepatitis C     Past Surgical History:  Procedure Laterality Date  . cervical discectomy with fusion    . COLONOSCOPY  01/2016  . EUS N/A 06/21/2016   Procedure: LOWER  ENDOSCOPIC ULTRASOUND (EUS);  Surgeon: Doren Custard, MD;  Location: [facility] ENDOSCOPY;  Service: Endoscopy;  Laterality: N/A;    No family history on file.  Social History:  reports that she has never smoked. She has never used smokeless tobacco. She reports that she uses drugs, including Marijuana. She reports that she does not drink alcohol.  She lives in [place].  She is a former truck and Midwife.  She has also worked in the [employer].  Her partner is Sandra Landry, a Engineer, civil (consulting).  The patient is accompanied by a friend, Sandra Landry.  Allergies:  Allergies  Allergen Reactions  . Other Swelling    Dust, ragweed  . Iodides   . Tetanus Toxoids Other (See Comments)    Localized pain and swelling  . Iodine Other (See Comments) and Rash    Limited history. Occurred in 22s, at age 71-12, received contrast at public health facility in [place], while being evaluated for meningitis. Immediate rash - not itchy, "flat, red, perfect circles" over entire body. Has not received iodine contrast since.    Current Medications: Current Outpatient Prescriptions  Medication Sig Dispense Refill  . baclofen (LIORESAL) 20 MG tablet Take 20 mg by mouth 4 (four) times daily as needed for muscle spasms.     . cetirizine (ZYRTEC) 10 MG tablet Take 10 mg by mouth daily.     Marland Kitchen diltiazem (CARDIZEM CD) 120 MG 24 hr capsule Take 120 mg by mouth daily.     Marland Kitchen  diphenhydrAMINE (BENADRYL) 25 MG tablet Take 25-50 mg by mouth every 6 (six) hours as needed.    Marland Kitchen escitalopram (LEXAPRO) 20 MG tablet Take 20 mg by mouth daily.     Marland Kitchen ibuprofen (ADVIL,MOTRIN) 200 MG tablet Take 200-600 mg by mouth every 6 (six) hours as needed for mild pain or moderate pain.    Marland Kitchen lactulose (CHRONULAC) 10 GM/15ML solution     . oxyCODONE-acetaminophen (PERCOCET/ROXICET) 5-325 MG tablet     . polyethylene glycol (MIRALAX / GLYCOLAX) packet Take 17 g by mouth daily as needed.     . prochlorperazine (COMPAZINE) 5 MG/ML injection Inject 5-10 mg  into the vein as directed. Every 4 to 6 hours as needed.    . promethazine (PHENERGAN) 25 MG suppository Place 25 mg rectally as directed. Every 4 to 6 hours as needed    . ranitidine (ZANTAC) 300 MG tablet Take 300 mg by mouth daily.     . traMADol (ULTRAM) 50 MG tablet Take 50-100 mg by mouth every 4 (four) hours as needed.     . potassium chloride (K-DUR) 10 MEQ tablet Take 2 tablets (20 mEq total) by mouth daily. 28 tablet 0  . potassium chloride (MICRO-K) 10 MEQ CR capsule      No current facility-administered medications for this visit.     Review of Systems:  GENERAL:  Fatigue.  No fever or sweats.  Weight l stable since last visit. PERFORMANCE STATUS (ECOG):  2 HEENT:  Decreased vision in left eye since fall (diplopia when eye open).  No runny nose, sore throat, mouth sores or tenderness. Lungs:  No shortness of breath or cough.  No hemoptysis. Cardiac:  No chest pain, palpitations, orthopnea, or PND. GI:  Appetite better (eating soups).  Chronic nausea.  Vomiting on occasion.  No diarrhea, constipationmelena or hematochezia. GU:  Right urostomy tube.  No urgency, frequency, or dysuria. Musculoskeletal:  Right sided back pain.  No joint pain.  No muscle tenderness. Extremities:  No pain or swelling. Skin:  No rashes or skin changes. Neuro:  Auditory hallucinations.  Unsteady on feet.  No numbness or weakness, or coordination issues. Endocrine:  No diabetes, thyroid issues, hot flashes or night sweats. Psych:  Hypersensitive.  PTSD.  Depression.  Anxiety. Pain:  Low back pain. Review of systems:  All other systems reviewed and found to be negative.  Physical Exam: There were no vitals taken for this visit. Blood pressure 94/62, pulse 65, temperature (!) 95.1 F (35.1 C), temperature source Tympanic, resp. rate 18, weight 136 lb 0.4 oz (61.7 kg). GENERAL:  Thin, fatigued appearing woman who is sitting in a wheelchair in the exam room in no acute distress.  MENTAL STATUS:  Alert  and oriented to person, place and time. HEAD:  Shaved head.  Normocephalic, atraumatic, face symmetric, no Cushingoid features. EYES:  Pupils equal round and reactive to light and accomodation.  No conjunctivitis or scleral icterus.  Intermittently closes left eye. ENT:  Oropharynx clear without lesion.  No upper teeth.  Tongue normal. Mucous membranes moist.  RESPIRATORY:  Clear to auscultation without rales, wheezes or rhonchi. CARDIOVASCULAR:  Regular rate and rhythm without murmur, rub or gallop. ABDOMEN:  Soft, nontender with active bowel sounds and no hepatosplenomegaly.   BACK:  Right sided urostomy with dressing in place. SKIN:  No rashes, ulcers or lesions. EXTREMITIES: No edema, no skin discoloration or tenderness.  No palpable cords. LYMPH NODES: No palpable cervical, supraclavicular, axillary or inguinal adenopathy  NEUROLOGICAL:  Alert & oriented, cranial nerves II-XII intact except diplopia unless left eye closed; motor strength symmetric; sensation intact; finger to nose and RAM slightly difficult. PSYCH:  Appropriate.   No visits with results within 3 Day(s) from this visit.  Latest known visit with results is:  Appointment on 10/18/2016  Component Date Value Ref Range Status  . WBC 10/18/2016 30.8* 3.6 - 11.0 K/uL Final  . RBC 10/18/2016 3.17* 3.80 - 5.20 MIL/uL Final  . Hemoglobin 10/18/2016 9.4* 12.0 - 16.0 g/dL Final  . HCT 10/18/2016 28.2* 35.0 - 47.0 % Final  . MCV 10/18/2016 89.2  80.0 - 100.0 fL Final  . MCH 10/18/2016 29.8  26.0 - 34.0 pg Final  . MCHC 10/18/2016 33.4  32.0 - 36.0 g/dL Final  . RDW 10/18/2016 19.1* 11.5 - 14.5 % Final  . Platelets 10/18/2016 384  150 - 440 K/uL Final    No visits with results within 3 Day(s) from this visit.  Latest known visit with results is:  Admission on 06/21/2016, Discharged on 06/21/2016  Component Date Value Ref Range Status  . SURGICAL PATHOLOGY 06/27/2016    Final                   Value:Surgical Pathology CASE:  [id number] PATIENT: Arlana Alvarez Surgical Pathology Report     SPECIMEN SUBMITTED: A. Recto-sigmoid mass; cbx  CLINICAL HISTORY: None provided  PRE-OPERATIVE DIAGNOSIS: Right pelvic retroperitoneal mass.  Needs biopsy  POST-OPERATIVE DIAGNOSIS: Invasive peri-rectal mass     DIAGNOSIS: A. RECTOSIGMOID MASS; COLD BIOPSY: - UROTHELIAL CARCINOMA UNDERMINING COLONIC MUCOSA.  Comment:  A limited panel of immunohistochemical stains was performed. The neoplastic cells have the following immunoreactivity: Super pancytokeratin: Positive p40: Positive GATA-3: Positive SOX-1: Negative CD45: Negative Vimentin: Negative DOG-1: Negative PAX-8: Negative CDX-2: Negative p16: Negative (high background) These findings support the above diagnosis. These results were communicated to Dr. Mike Gip on 06/27/2016. Stain controls worked appropriately.  GROSS DESCRIPTION: A. Labeled: rectosigmoid mass C BX Tissue fragment(s): multiple S                         ize: aggregate, 0.8 x 0.6 x 0.1 cm Description: tan fragments  Entirely submitted in 1 cassette(s).  Final Diagnosis performed by Quay Burow, MD.  Electronically signed 06/27/2016 4:00:48PM    The electronic signature indicates that the named Attending Pathologist has evaluated the specimen  Technical component performed at [facility], [address], [place], [place] [address] Lab: [phone] Dir: Darrick Penna. Evette Doffing, MD  Professional component performed at [facility], [facility], [place], [place], [place] [address] Lab: [phone] Dir: Dellia Nims. Reuel Derby, MD      Assessment:  Sandra Landry is a 66 y.o. 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  She has a history of lower abdomen for 2 years felt secondary to irritable bowel syndrome.  She has had RLQ pain which has extended to her right buttock and hip for 3 months.  She has lost 38 pounds in the past  year.  Lower endoscopic ultrasound on 06/21/2016 revealed an infiltrative 5.6 x 4.5 cm non-obstructing large mass in the recto-sigmoid colon extendiing from 14 cm to 18 cm from the anal verge. The mass was non-circumferential.  Biopsies revealed a urothelial carcinoma.  Pathologic stage was T4NxM0.  Abdominal and pelvic CT scan on 05/14/2016 revealed a 5.7 cm right lower quadrant retroperitoneal mass highly concerning for malignancy. The mass involved the sigmoid colon but  is favored to reflect a primary retroperitoneal tumor (such as sarcoma) over a primary colonic mass.  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  There were multiple subcentimeter liver lesions, indeterminate.   PET scan on 06/06/2016 revealed hypermetabolism (SUV 19.7)  corresponding to the right lower quadrant retroperitoneal mass. It was felt intimately associated with the rectosigmoid colon, but originating outside of the colon. There was similar right-sided hydroureteronephrosis due to the right lower quadrant mass.  There was a 4 mm nodule in the left lower lobe (unclear significance).  Colonoscopy at [facility] on 07/18/2015 revealed 3 adenomatous polyps.  She underwent right nephrostomy tube placement on 07/11/2016  She has a normocytic anemia.  Labs on 07/26/2016 revealed a ferritin (284), iron saturation (4%), TIBC 179 (low) B12 (628), folate (7.1), and ESR (54).  She has had some blood from her nephrostomy tube.  She has anemia of chronic disease.  Diet is poor.  She received 1 unit PRBCs on 08/01/2016.  She has leukocytosis secondary to her underlying malignancy.  Work-up on 07/26/2016 revealed a negative urinalysis, blood cultures, and CXR.  Urinalysis revealed 20,000 colonies/ml Klebsiella pneumonia.  She received a course of ciprofloxacin.  Urine culture on 08/06/2016 revealed Stenotrophomonas maltophilia.  She was treated with a course of Levaquin.  Prior to treatment,  she lost 35+ pounds.  She began Megace on 07/26/2016.  She has lost an additional 12 pounds with her recent admission to [facility].  She has received 2 cycles of cisplatin and gemcitabine (07/30/2016 - 09/04/2016).  Cisplatin was given as a split dose (day 1 and 2).  Course was complicated by an admission to [facility] on 08/08/2016 following a fall.  Head MRI revealed a subarachnoid hemorrhage.  She had atrial fibrillation with RVR.  Chest, abdomen, and pelvic CT scan on 08/08/2016 revealed a mild age-indeterminate compression fracture of T6. There was a 6 mm hypodensity at the dome of the liver too small to characterize and a few additional small subcentimeter hypodense scattered lesions in the liver. There was a 7.3 x 6.4 x 7.7 cm right pelvic mass invading the sacrum and causing a lytic lesion with pathologic fracture.  The mass likely invaded the right psoas muscle.  Symptomatically, she remains weak.  She is resting 90% of day.  She is eating better.  She has had auditory hallucinations.  She has diplopia unless her left eye is closed.  Plan: 1.  Labs today:  CBC with diff, CMP.   2.  Discuss patiient's thoughts about therapy.  Discuss atezolizumab Gildardo Pounds).  Information provided. 3.  Rx:  MS Contin 30 mg po q 12 hours. 4.  Port flush every 4-6 weeks. 5.  RTC in 2 weeks for MD assessment and labs (CBC with diff, CMP).   Lequita Asal, MD 10/08/2016

## 2016-10-23 ENCOUNTER — Other Ambulatory Visit: Payer: Self-pay | Admitting: *Deleted

## 2016-10-23 ENCOUNTER — Ambulatory Visit: Payer: 59

## 2016-10-23 ENCOUNTER — Ambulatory Visit
Admission: RE | Admit: 2016-10-23 | Discharge: 2016-10-23 | Disposition: A | Discharge status: Home / Self Care | Payer: 59 | Source: Ambulatory Visit | Attending: Radiation Oncology | Admitting: Radiation Oncology

## 2016-10-23 DIAGNOSIS — Z510 Encounter for antineoplastic radiation therapy: Secondary | ICD-10-CM | POA: Diagnosis not present

## 2016-10-23 MED ORDER — FENTANYL 50 MCG/HR TD PT72: 50.0000 ug | MEDICATED_PATCH | TRANSDERMAL | 0 refills | Status: DC

## 2016-10-24 ENCOUNTER — Ambulatory Visit
Admission: RE | Admit: 2016-10-24 | Discharge: 2016-10-24 | Disposition: A | Discharge status: Home / Self Care | Payer: 59 | Source: Ambulatory Visit | Attending: Radiation Oncology | Admitting: Radiation Oncology

## 2016-10-24 ENCOUNTER — Ambulatory Visit: Payer: 59

## 2016-10-24 ENCOUNTER — Telehealth: Payer: Self-pay | Admitting: *Deleted

## 2016-10-24 DIAGNOSIS — Z510 Encounter for antineoplastic radiation therapy: Secondary | ICD-10-CM | POA: Diagnosis not present

## 2016-10-24 NOTE — Telephone Encounter (Signed)
Patty would like a call back about her FMLA form, upcoming appt and rx that Dr. Baruch Gouty sent.  Please call her on cell phone

## 2016-10-25 ENCOUNTER — Other Ambulatory Visit: Payer: Self-pay | Admitting: *Deleted

## 2016-10-25 ENCOUNTER — Ambulatory Visit
Admission: RE | Admit: 2016-10-25 | Discharge: 2016-10-25 | Disposition: A | Discharge status: Home / Self Care | Payer: 59 | Source: Ambulatory Visit | Attending: Radiation Oncology | Admitting: Radiation Oncology

## 2016-10-25 ENCOUNTER — Inpatient Hospital Stay: Payer: 59

## 2016-10-25 ENCOUNTER — Ambulatory Visit: Payer: 59

## 2016-10-25 DIAGNOSIS — C661 Malignant neoplasm of right ureter: Secondary | ICD-10-CM

## 2016-10-25 DIAGNOSIS — I82412 Acute embolism and thrombosis of left femoral vein: Secondary | ICD-10-CM

## 2016-10-25 DIAGNOSIS — Z510 Encounter for antineoplastic radiation therapy: Secondary | ICD-10-CM | POA: Diagnosis not present

## 2016-10-25 LAB — CBC
HCT: 28.6 % — ABNORMAL LOW (ref 35.0–47.0)
Hemoglobin: 9.3 g/dL — ABNORMAL LOW (ref 12.0–16.0)
MCH: 29.5 pg (ref 26.0–34.0)
MCHC: 32.6 g/dL (ref 32.0–36.0)
MCV: 90.5 fL (ref 80.0–100.0)
PLATELETS: 385 10*3/uL (ref 150–440)
RBC: 3.16 MIL/uL — ABNORMAL LOW (ref 3.80–5.20)
RDW: 18.7 % — ABNORMAL HIGH (ref 11.5–14.5)
WBC: 25.7 10*3/uL — AB (ref 3.6–11.0)

## 2016-10-25 LAB — COMPREHENSIVE METABOLIC PANEL
ALT: <5 U/L — ABNORMAL LOW (ref 14–54)
AST: 14 U/L — ABNORMAL LOW (ref 15–41)
Albumin: 3.2 g/dL — ABNORMAL LOW (ref 3.5–5.0)
Alkaline Phosphatase: 61 U/L (ref 38–126)
Anion gap: 11 (ref 5–15)
BUN: 23 mg/dL — ABNORMAL HIGH (ref 6–20)
CO2: 21 mmol/L — ABNORMAL LOW (ref 22–32)
Calcium: 9.1 mg/dL (ref 8.9–10.3)
Chloride: 99 mmol/L — ABNORMAL LOW (ref 101–111)
Creatinine, Ser: 1.54 mg/dL — ABNORMAL HIGH (ref 0.44–1.00)
GFR calc Af Amer: 40 mL/min — ABNORMAL LOW (ref >60)
GFR calc non Af Amer: 35 mL/min — ABNORMAL LOW (ref >60)
Glucose, Bld: 97 mg/dL (ref 65–99)
Potassium: 4.4 mmol/L (ref 3.5–5.1)
Sodium: 131 mmol/L — ABNORMAL LOW (ref 135–145)
Total Bilirubin: 0.4 mg/dL (ref 0.3–1.2)
Total Protein: 6.9 g/dL (ref 6.5–8.1)

## 2016-10-26 ENCOUNTER — Ambulatory Visit: Payer: 59

## 2016-10-26 ENCOUNTER — Telehealth: Payer: Self-pay | Admitting: *Deleted

## 2016-10-26 ENCOUNTER — Ambulatory Visit
Admission: RE | Admit: 2016-10-26 | Discharge: 2016-10-26 | Disposition: A | Discharge status: Home / Self Care | Payer: 59 | Source: Ambulatory Visit | Attending: Radiation Oncology | Admitting: Radiation Oncology

## 2016-10-26 DIAGNOSIS — Z510 Encounter for antineoplastic radiation therapy: Secondary | ICD-10-CM | POA: Diagnosis not present

## 2016-10-26 NOTE — Telephone Encounter (Signed)
-----   Message from Lequita Asal, MD sent at 10/26/2016  3:19 AM EST ----- Regarding: Please call patient  Sodium a little low. BUN/Cr suggests component of dehydration.  Encourage fluids with electrolytes. Does she feel like she needs IVF?  M  ----- Message ----- From: Interface, Lab In [place] Sent: 10/25/2016   5:07 PM To: Lequita Asal, MD

## 2016-10-26 NOTE — Telephone Encounter (Signed)
Called patient and spoke to Sandra Landry to inform her that N+ is a little low.  MD encouraged patient to drink fluids with electrolytes. BUN and Cr indicates some dehydration also.  If patient feels like she needs IVF's she is instructed to call us back.  Per Sandra Landry, Dr. Donella Stade gave her Fentanyl patches yesterday because of increased pain level.  She is very groggy this morning.  Chong Sicilian will talk with her and call back if she wants to come in for fluids.

## 2016-10-26 NOTE — Telephone Encounter (Signed)
Called patient's caretaker, Patty and LVM  to inform her if patient came for IVF's it would be NS.  She has bought gatorade and will try that for now.  Advised her to call back on Monday if patient is not feeling better and would like IVF's.

## 2016-10-29 ENCOUNTER — Ambulatory Visit: Payer: 59

## 2016-10-29 ENCOUNTER — Ambulatory Visit
Admission: RE | Admit: 2016-10-29 | Discharge: 2016-10-29 | Disposition: A | Discharge status: Home / Self Care | Payer: 59 | Source: Ambulatory Visit | Attending: Radiation Oncology | Admitting: Radiation Oncology

## 2016-10-29 DIAGNOSIS — Z510 Encounter for antineoplastic radiation therapy: Secondary | ICD-10-CM | POA: Diagnosis not present

## 2016-10-30 ENCOUNTER — Telehealth: Payer: Self-pay | Admitting: *Deleted

## 2016-10-30 ENCOUNTER — Ambulatory Visit
Admission: RE | Admit: 2016-10-30 | Discharge: 2016-10-30 | Disposition: A | Discharge status: Home / Self Care | Payer: 59 | Source: Ambulatory Visit | Attending: Radiation Oncology | Admitting: Radiation Oncology

## 2016-10-30 ENCOUNTER — Other Ambulatory Visit: Payer: Self-pay | Admitting: Oncology

## 2016-10-30 ENCOUNTER — Other Ambulatory Visit: Payer: Self-pay | Admitting: *Deleted

## 2016-10-30 ENCOUNTER — Ambulatory Visit: Payer: 59

## 2016-10-30 DIAGNOSIS — Z510 Encounter for antineoplastic radiation therapy: Secondary | ICD-10-CM | POA: Diagnosis not present

## 2016-10-30 MED ORDER — OXYCODONE-ACETAMINOPHEN 5-325 MG PO TABS: 1.0000 | ORAL_TABLET | ORAL | 0 refills | Status: DC | PRN

## 2016-10-30 NOTE — Telephone Encounter (Signed)
spoke with Mardene Celeste patients significant other and discussed need for pain diary and prescription to be picked up this afternoon for pain med refill, voiced understanding.

## 2016-10-31 ENCOUNTER — Ambulatory Visit: Payer: 59

## 2016-11-01 ENCOUNTER — Inpatient Hospital Stay: Payer: 59

## 2016-11-01 ENCOUNTER — Ambulatory Visit: Payer: 59

## 2016-11-01 ENCOUNTER — Ambulatory Visit
Admission: RE | Admit: 2016-11-01 | Discharge: 2016-11-01 | Disposition: A | Discharge status: Home / Self Care | Payer: 59 | Source: Ambulatory Visit | Attending: Radiation Oncology | Admitting: Radiation Oncology

## 2016-11-01 DIAGNOSIS — Z510 Encounter for antineoplastic radiation therapy: Secondary | ICD-10-CM | POA: Diagnosis not present

## 2016-11-01 DIAGNOSIS — C661 Malignant neoplasm of right ureter: Secondary | ICD-10-CM

## 2016-11-01 LAB — CBC
HCT: 27.8 % — ABNORMAL LOW (ref 35.0–47.0)
Hemoglobin: 9.3 g/dL — ABNORMAL LOW (ref 12.0–16.0)
MCH: 29.8 pg (ref 26.0–34.0)
MCHC: 33.3 g/dL (ref 32.0–36.0)
MCV: 89.6 fL (ref 80.0–100.0)
PLATELETS: 376 10*3/uL (ref 150–440)
RBC: 3.11 MIL/uL — AB (ref 3.80–5.20)
RDW: 18.8 % — AB (ref 11.5–14.5)
WBC: 19.5 10*3/uL — ABNORMAL HIGH (ref 3.6–11.0)

## 2016-11-02 ENCOUNTER — Ambulatory Visit: Payer: 59

## 2016-11-02 ENCOUNTER — Ambulatory Visit
Admission: RE | Admit: 2016-11-02 | Discharge: 2016-11-02 | Disposition: A | Discharge status: Home / Self Care | Payer: 59 | Source: Ambulatory Visit | Attending: Radiation Oncology | Admitting: Radiation Oncology

## 2016-11-02 DIAGNOSIS — Z510 Encounter for antineoplastic radiation therapy: Secondary | ICD-10-CM | POA: Diagnosis not present

## 2016-11-05 ENCOUNTER — Inpatient Hospital Stay: Payer: 59

## 2016-11-05 ENCOUNTER — Ambulatory Visit: Payer: 59

## 2016-11-05 ENCOUNTER — Ambulatory Visit
Admission: RE | Admit: 2016-11-05 | Discharge: 2016-11-05 | Disposition: A | Discharge status: Home / Self Care | Payer: 59 | Source: Ambulatory Visit | Attending: Radiation Oncology | Admitting: Radiation Oncology

## 2016-11-05 ENCOUNTER — Inpatient Hospital Stay (HOSPITAL_BASED_OUTPATIENT_CLINIC_OR_DEPARTMENT_OTHER): Payer: 59 | Admitting: Hematology and Oncology

## 2016-11-05 ENCOUNTER — Encounter: Payer: Self-pay | Admitting: Hematology and Oncology

## 2016-11-05 VITALS — BP 113/69 | HR 94 | Temp 97.4°F | Resp 18 | Wt 131.0 lb

## 2016-11-05 DIAGNOSIS — Z510 Encounter for antineoplastic radiation therapy: Secondary | ICD-10-CM | POA: Diagnosis not present

## 2016-11-05 DIAGNOSIS — M129 Arthropathy, unspecified: Secondary | ICD-10-CM

## 2016-11-05 DIAGNOSIS — D638 Anemia in other chronic diseases classified elsewhere: Secondary | ICD-10-CM | POA: Diagnosis not present

## 2016-11-05 DIAGNOSIS — M5030 Other cervical disc degeneration, unspecified cervical region: Secondary | ICD-10-CM

## 2016-11-05 DIAGNOSIS — F419 Anxiety disorder, unspecified: Secondary | ICD-10-CM | POA: Diagnosis not present

## 2016-11-05 DIAGNOSIS — Z79899 Other long term (current) drug therapy: Secondary | ICD-10-CM

## 2016-11-05 DIAGNOSIS — R066 Hiccough: Secondary | ICD-10-CM

## 2016-11-05 DIAGNOSIS — I82412 Acute embolism and thrombosis of left femoral vein: Secondary | ICD-10-CM

## 2016-11-05 DIAGNOSIS — R634 Abnormal weight loss: Secondary | ICD-10-CM | POA: Diagnosis not present

## 2016-11-05 DIAGNOSIS — D649 Anemia, unspecified: Secondary | ICD-10-CM

## 2016-11-05 DIAGNOSIS — R1903 Right lower quadrant abdominal swelling, mass and lump: Secondary | ICD-10-CM | POA: Diagnosis not present

## 2016-11-05 DIAGNOSIS — F329 Major depressive disorder, single episode, unspecified: Secondary | ICD-10-CM

## 2016-11-05 DIAGNOSIS — R1031 Right lower quadrant pain: Secondary | ICD-10-CM

## 2016-11-05 DIAGNOSIS — G893 Neoplasm related pain (acute) (chronic): Secondary | ICD-10-CM | POA: Diagnosis not present

## 2016-11-05 DIAGNOSIS — M797 Fibromyalgia: Secondary | ICD-10-CM

## 2016-11-05 DIAGNOSIS — C661 Malignant neoplasm of right ureter: Secondary | ICD-10-CM | POA: Diagnosis not present

## 2016-11-05 DIAGNOSIS — I824Z2 Acute embolism and thrombosis of unspecified deep veins of left distal lower extremity: Secondary | ICD-10-CM

## 2016-11-05 DIAGNOSIS — K219 Gastro-esophageal reflux disease without esophagitis: Secondary | ICD-10-CM

## 2016-11-05 DIAGNOSIS — N1330 Unspecified hydronephrosis: Secondary | ICD-10-CM

## 2016-11-05 DIAGNOSIS — J449 Chronic obstructive pulmonary disease, unspecified: Secondary | ICD-10-CM

## 2016-11-05 DIAGNOSIS — I4891 Unspecified atrial fibrillation: Secondary | ICD-10-CM

## 2016-11-05 DIAGNOSIS — K589 Irritable bowel syndrome without diarrhea: Secondary | ICD-10-CM

## 2016-11-05 DIAGNOSIS — J45909 Unspecified asthma, uncomplicated: Secondary | ICD-10-CM

## 2016-11-05 DIAGNOSIS — Z8701 Personal history of pneumonia (recurrent): Secondary | ICD-10-CM

## 2016-11-05 DIAGNOSIS — Z8619 Personal history of other infectious and parasitic diseases: Secondary | ICD-10-CM

## 2016-11-05 DIAGNOSIS — D72829 Elevated white blood cell count, unspecified: Secondary | ICD-10-CM

## 2016-11-05 DIAGNOSIS — Z8781 Personal history of (healed) traumatic fracture: Secondary | ICD-10-CM

## 2016-11-05 DIAGNOSIS — Z923 Personal history of irradiation: Secondary | ICD-10-CM

## 2016-11-05 LAB — BASIC METABOLIC PANEL
Anion gap: 8 (ref 5–15)
BUN: 35 mg/dL — ABNORMAL HIGH (ref 6–20)
CO2: 23 mmol/L (ref 22–32)
Calcium: 8.6 mg/dL — ABNORMAL LOW (ref 8.9–10.3)
Chloride: 105 mmol/L (ref 101–111)
Creatinine, Ser: 1.65 mg/dL — ABNORMAL HIGH (ref 0.44–1.00)
GFR calc Af Amer: 37 mL/min — ABNORMAL LOW (ref >60)
GFR calc non Af Amer: 32 mL/min — ABNORMAL LOW (ref >60)
Glucose, Bld: 116 mg/dL — ABNORMAL HIGH (ref 65–99)
Potassium: 4.2 mmol/L (ref 3.5–5.1)
Sodium: 136 mmol/L (ref 135–145)

## 2016-11-05 LAB — VITAMIN B12: Vitamin B-12: 201 pg/mL (ref 180–914)

## 2016-11-05 LAB — IRON AND TIBC
Iron: 38 ug/dL (ref 28–170)
Saturation Ratios: 17 % (ref 10.4–31.8)
TIBC: 230 ug/dL — ABNORMAL LOW (ref 250–450)
UIBC: 192 ug/dL

## 2016-11-05 LAB — CBC WITH DIFFERENTIAL/PLATELET
Basophils Absolute: 0.1 10*3/uL (ref 0–0.1)
Basophils Relative: 1 %
Eosinophils Absolute: 0.5 10*3/uL (ref 0–0.7)
Eosinophils Relative: 4 %
HCT: 23.6 % — ABNORMAL LOW (ref 35.0–47.0)
Hemoglobin: 8 g/dL — ABNORMAL LOW (ref 12.0–16.0)
Lymphocytes Relative: 14 %
Lymphs Abs: 1.9 10*3/uL (ref 1.0–3.6)
MCH: 30.7 pg (ref 26.0–34.0)
MCHC: 33.9 g/dL (ref 32.0–36.0)
MCV: 90.6 fL (ref 80.0–100.0)
Monocytes Absolute: 0.8 10*3/uL (ref 0.2–0.9)
Monocytes Relative: 5 %
Neutro Abs: 10.8 10*3/uL — ABNORMAL HIGH (ref 1.4–6.5)
Neutrophils Relative %: 76 %
Platelets: 301 10*3/uL (ref 150–440)
RBC: 2.61 MIL/uL — ABNORMAL LOW (ref 3.80–5.20)
RDW: 18.7 % — ABNORMAL HIGH (ref 11.5–14.5)
WBC: 14.1 10*3/uL — ABNORMAL HIGH (ref 3.6–11.0)

## 2016-11-05 LAB — FERRITIN: Ferritin: 474 ng/mL — ABNORMAL HIGH (ref 11–307)

## 2016-11-05 LAB — SEDIMENTATION RATE: Sed Rate: 83 mm/hr — ABNORMAL HIGH (ref 0–30)

## 2016-11-05 LAB — FOLATE: Folate: 5.8 ng/mL — ABNORMAL LOW (ref >5.9)

## 2016-11-05 MED ORDER — HEPARIN SOD (PORK) LOCK FLUSH 100 UNIT/ML IV SOLN
500.0000 [IU] | Freq: Once | INTRAVENOUS | Status: AC
  Administered 2016-11-05: 500 [IU] via INTRAVENOUS
  Filled 2016-11-05: qty 5

## 2016-11-05 MED ORDER — FENTANYL 50 MCG/HR TD PT72: 50.0000 ug | MEDICATED_PATCH | TRANSDERMAL | 0 refills | Status: DC

## 2016-11-05 MED ORDER — SODIUM CHLORIDE 0.9% FLUSH
10.0000 mL | Freq: Once | INTRAVENOUS | Status: AC
  Administered 2016-11-05: 10 mL via INTRAVENOUS
  Filled 2016-11-05: qty 10

## 2016-11-05 NOTE — Progress Notes (Signed)
Patient having pain on urination. Also states pain is still not covered even with fentanyl patches.  Having pain in right lower back/flank area.

## 2016-11-05 NOTE — Progress Notes (Signed)
[facility]-  Cancer Center  Clinic day:  11/05/2016   Chief Complaint: Sandra Landry is a 66 y.o. female with locally advanced right ureteral cancer who is seen for reassessment during palliative radiation.  HPI: The patient was last seen in the medical oncology clinic on 10/08/2016.  At that time, she was scheduled to begin radiation.  Pain was moderately well controlled with MS Contin 30 mg q 12 hours and Percocet prn.  She was on Lovenox for an extensive left lower extremity DVT.  She was eating better, but spending the majority of her day resting.  We discussed treatment options after completion of radiation.  She states that she is 1/2 way through her radiation.  She has had ongoing issues with pain.  She feels that her pain is no better, maybe worse.  She spends most of her time in bed.  She states that she does some things around the living room.  She is able to perform all of her activities of daily living.  She is utilizing a Fentanyl patch and Percocet  6/day.  She is interested in receiving message therapy from palliative care.  She states that she is due for a nephrostomy tube exchange, but really wants the tube out. She has singultus (hiccups).  She is eating well.   Past Medical History:  Diagnosis Date  . Anxiety   . Arthritis   . Asthma    mild  . Cervical disc disease   . COPD (chronic obstructive pulmonary disease) (HCC)    mild  . Depression   . Dysrhythmia    tachycardia  . Fibromyalgia   . GERD (gastroesophageal reflux disease)   . Hepatitis C     Past Surgical History:  Procedure Laterality Date  . cervical discectomy with fusion    . COLONOSCOPY  01/2016  . EUS N/A 06/21/2016   Procedure: LOWER ENDOSCOPIC ULTRASOUND (EUS);  Surgeon: Joshua P Spaete, MD;  Location: [facility] ENDOSCOPY;  Service: Endoscopy;  Laterality: N/A;    No family history on file.  Social History:  reports that she has never smoked. She has never used smokeless  tobacco. She reports that she uses drugs, including Marijuana. She reports that she does not drink alcohol.  She lives in [place].  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  Her partner is Patty O'Connor, a nurse.  The patient lives in [place].  Allergies:  Allergies  Allergen Reactions  . Other Swelling    Dust, ragweed  . Iodides   . Tetanus Toxoids Other (See Comments)    Localized pain and swelling  . Iodine Other (See Comments) and Rash    Limited history. Occurred in 1960s, at age 10-12, received contrast at public health facility in [place], while being evaluated for meningitis. Immediate rash - not itchy, "flat, red, perfect circles" over entire body. Has not received iodine contrast since.    Current Medications: Current Outpatient Prescriptions  Medication Sig Dispense Refill  . baclofen (LIORESAL) 20 MG tablet Take 20 mg by mouth 4 (four) times daily as needed for muscle spasms.     . cetirizine (ZYRTEC) 10 MG tablet Take 10 mg by mouth daily.     . diltiazem (CARDIZEM CD) 120 MG 24 hr capsule Take 120 mg by mouth daily.     . diphenhydrAMINE (BENADRYL) 25 MG tablet Take 25-50 mg by mouth every 6 (six) hours as needed.    .   escitalopram (LEXAPRO) 20 MG tablet Take 20 mg by mouth daily.     . ibuprofen (ADVIL,MOTRIN) 200 MG tablet Take 200-600 mg by mouth every 6 (six) hours as needed for mild pain or moderate pain.    . lactulose (CHRONULAC) 10 GM/15ML solution     . oxyCODONE-acetaminophen (PERCOCET/ROXICET) 5-325 MG tablet     . polyethylene glycol (MIRALAX / GLYCOLAX) packet Take 17 g by mouth daily as needed.     . prochlorperazine (COMPAZINE) 5 MG/ML injection Inject 5-10 mg into the vein as directed. Every 4 to 6 hours as needed.    . promethazine (PHENERGAN) 25 MG suppository Place 25 mg rectally as directed. Every 4 to 6 hours as needed    . ranitidine (ZANTAC) 300 MG tablet Take 300 mg by mouth daily.     . traMADol (ULTRAM) 50  MG tablet Take 50-100 mg by mouth every 4 (four) hours as needed.     . potassium chloride (K-DUR) 10 MEQ tablet Take 2 tablets (20 mEq total) by mouth daily. 28 tablet 0  . potassium chloride (MICRO-K) 10 MEQ CR capsule      No current facility-administered medications for this visit.     Review of Systems:  GENERAL:  Fatigue.  No fever or sweats.  Weight down 8 pounds since last visit. PERFORMANCE STATUS (ECOG):  2 HEENT:  Decreased vision in left eye since fall.  No runny nose, sore throat, mouth sores or tenderness. Lungs:  No shortness of breath or cough.  No hemoptysis.  Singultus. Cardiac:  No chest pain, palpitations, orthopnea, or PND. GI:  Eating well  No diarrhea, constipationmelena or hematochezia. GU:  Right urostomy tube (wants out).  No urgency, frequency, or dysuria. Musculoskeletal:  Right sided back pain.  No joint pain.  No muscle tenderness. Extremities:  No pain or swelling. Skin:  No rashes or skin changes. Neuro:  General weakness.  No focal numbness or weakness, or coordination issues. Endocrine:  No diabetes, thyroid issues, hot flashes or night sweats. Psych:  Hypersensitive.  PTSD.  Depression.  Anxiety. Pain:  Low back pain (no better since radiation). Review of systems:  All other systems reviewed and found to be negative.  Physical Exam: BP 113/69 (BP Location: Left Arm, Patient Position: Sitting)   Pulse 94   Temp 97.4 F (36.3 C) (Tympanic)   Resp 18   Wt 130 lb 15.3 oz (59.4 kg)   BMI 21.14 kg/m  GENERAL:  Thin, chronically fatigued appearing woman sitting in the exam room in no acute distress.  MENTAL STATUS:  Alert and oriented to person, place and time. HEAD:  Shaved head.  Normocephalic, atraumatic, face symmetric, no Cushingoid features. EYES:  Pupils equal round and reactive to light and accomodation.  No conjunctivitis or scleral icterus. ENT:  Oropharynx clear without lesion.  No upper teeth.  Tongue normal. Mucous membranes moist.   RESPIRATORY:  Clear to auscultation without rales, wheezes or rhonchi. CARDIOVASCULAR:  Regular rate and rhythm without murmur, rub or gallop. ABDOMEN:  Soft, nontender with active bowel sounds and no hepatosplenomegaly.   BACK:  Right sided urostomy with dressing in place. SKIN:  No rashes, ulcers or lesions. EXTREMITIES: Left lower extremity edema.  No skin discoloration or tenderness.  No palpable cords. LYMPH NODES: No palpable cervical, supraclavicular, axillary or inguinal adenopathy  NEUROLOGICAL:  Appropriate. PSYCH:  Appropriate.   No visits with results within 3 Day(s) from this visit.  Latest known visit with results is:  Appointment   on 11/01/2016  Component Date Value Ref Range Status  . WBC 11/01/2016 19.5* 3.6 - 11.0 K/uL Final  . RBC 11/01/2016 3.11* 3.80 - 5.20 MIL/uL Final  . Hemoglobin 11/01/2016 9.3* 12.0 - 16.0 g/dL Final  . HCT 11/01/2016 27.8* 35.0 - 47.0 % Final  . MCV 11/01/2016 89.6  80.0 - 100.0 fL Final  . MCH 11/01/2016 29.8  26.0 - 34.0 pg Final  . MCHC 11/01/2016 33.3  32.0 - 36.0 g/dL Final  . RDW 11/01/2016 18.8* 11.5 - 14.5 % Final  . Platelets 11/01/2016 376  150 - 440 K/uL Final    No visits with results within 3 Day(s) from this visit.  Latest known visit with results is:  Admission on 06/21/2016, Discharged on 06/21/2016  Component Date Value Ref Range Status  . SURGICAL PATHOLOGY 06/27/2016    Final                   Value:Surgical Pathology CASE: [id number] PATIENT: Zionna Arlington Surgical Pathology Report     SPECIMEN SUBMITTED: A. Recto-sigmoid mass; cbx  CLINICAL HISTORY: None provided  PRE-OPERATIVE DIAGNOSIS: Right pelvic retroperitoneal mass.  Needs biopsy  POST-OPERATIVE DIAGNOSIS: Invasive peri-rectal mass     DIAGNOSIS: A. RECTOSIGMOID MASS; COLD BIOPSY: - UROTHELIAL CARCINOMA UNDERMINING COLONIC MUCOSA.  Comment:  A limited panel of immunohistochemical stains was performed. The neoplastic cells have the  following immunoreactivity: Super pancytokeratin: Positive p40: Positive GATA-3: Positive SOX-1: Negative CD45: Negative Vimentin: Negative DOG-1: Negative PAX-8: Negative CDX-2: Negative p16: Negative (high background) These findings support the above diagnosis. These results were communicated to Dr. [name] on 06/27/2016. Stain controls worked appropriately.  GROSS DESCRIPTION: A. Labeled: rectosigmoid mass C BX Tissue fragment(s): multiple S                         ize: aggregate, 0.8 x 0.6 x 0.1 cm Description: tan fragments  Entirely submitted in 1 cassette(s).  Final Diagnosis performed by Tara Rubinas, MD.  Electronically signed 06/27/2016 4:00:48PM    The electronic signature indicates that the named Attending Pathologist has evaluated the specimen  Technical component performed at [facility], [address], [place], [place] [address] Lab: [phone] Dir: William F. Hancock, MD  Professional component performed at [facility], [facility], [address], [place], [place] [address] Lab: [phone] Dir: Tara C. Rubinas, MD      Assessment:  Sandra Landry is a 66 y.o. female with metastatic right ureteral carcinoma s/p biopsy.  She has a history of lower abdomen for 2 years felt secondary to irritable bowel syndrome.  She has had RLQ pain which has extended to her right buttock and hip for 3 months.  She has lost 38 pounds in the past year.  Lower endoscopic ultrasound on 06/21/2016 revealed an infiltrative 5.6 x 4.5 cm non-obstructing large mass in the recto-sigmoid colon extendiing from 14 cm to 18 cm from the anal verge. The mass was non-circumferential.  Biopsies revealed a urothelial carcinoma.  Pathologic stage was T4NxM0.  Abdominal and pelvic CT scan on 05/14/2016 revealed a 5.7 cm right lower quadrant retroperitoneal mass highly concerning for malignancy. The mass involved the sigmoid colon but is favored to reflect a primary  retroperitoneal tumor (such as sarcoma) over a primary colonic mass.  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  There were multiple subcentimeter liver lesions, indeterminate.   PET scan on 06/06/2016 revealed hypermetabolism (SUV 19.7)  corresponding   to the right lower quadrant retroperitoneal mass. It was felt intimately associated with the rectosigmoid colon, but originating outside of the colon. There was similar right-sided hydroureteronephrosis due to the right lower quadrant mass.  There was a 4 mm nodule in the left lower lobe (unclear significance).  Colonoscopy at [facility] on 07/18/2015 revealed 3 adenomatous polyps.  She underwent right nephrostomy tube placement on 07/11/2016  She has a normocytic anemia.  Labs on 07/26/2016 revealed a ferritin (284), iron saturation (4%), TIBC 179 (low) B12 (628), folate (7.1), and ESR (54).  She has had some blood from her nephrostomy tube.  She has anemia of chronic disease.  Diet is poor.  She received 1 unit PRBCs on 08/01/2016.  She has leukocytosis secondary to her underlying malignancy.  Work-up on 07/26/2016 revealed a negative urinalysis, blood cultures, and CXR.  Urinalysis revealed 20,000 colonies/ml Klebsiella pneumonia.  She received a course of ciprofloxacin.  Urine culture on 08/06/2016 revealed Stenotrophomonas maltophilia.  She was treated with a course of Levaquin.  Prior to treatment, she lost 35+ pounds.  She began Megace on 07/26/2016.  She has lost an additional 12 pounds with her recent admission to [facility].  She has received 2 cycles of cisplatin and gemcitabine (07/30/2016 - 09/04/2016).  Cisplatin was given as a split dose (day 1 and 2).  Course was complicated by an admission to [facility] on 08/08/2016 following a fall.  Head MRI revealed a subarachnoid hemorrhage.  She had atrial fibrillation with RVR.  Chest, abdomen, and pelvic CT scan on 08/08/2016 revealed a mild age-indeterminate  compression fracture of T6. There was a 6 mm hypodensity at the dome of the liver too small to characterize and a few additional small subcentimeter hypodense scattered lesions in the liver. There was a 7.3 x 6.4 x 7.7 cm right pelvic mass invading the sacrum and causing a lytic lesion with pathologic fracture.  The mass likely invaded the right psoas muscle.  Left lower extremity duplex on 09/15/2016 revealed extensive left lower extremity DVT involving the entire visualized deep veins from the level of the common femoral vein to the calf veins.  An IVC filter was placed on 09/20/2015.  She is on Lovenox.  She is undergoing palliative radiation.  Pain has not improved.  Symptomatically, she remains weak.  She is eating well.  Pain is modestly controlled.  She has progressive anemia.  Plan: 1.  Labs today:  CBC with diff, BMP, ferritin, iron studies, B12, folate, ESR. 2.  Discuss patient's thoughts about therapy (Tecentriq) after completion of radiation.  Discuss restaging CT scans. 3.  Rx:  Fentanyl 50 mcg/hr patch (dis: #5). 4.  Discuss management of singultus.  5.  Palliative care consult. 6.  Massage therapy. 7.  Follow-up with Dr. Erlene Quan to discuss nephrostomy tube removal. 8.  Schedule chest, abdomen, CT scan re: restaging after radiation. 9.  RTC for MD assessment after imaging.   Lequita Asal, MD 11/05/2016

## 2016-11-06 ENCOUNTER — Ambulatory Visit: Payer: 59

## 2016-11-06 ENCOUNTER — Telehealth: Payer: Self-pay | Admitting: *Deleted

## 2016-11-06 ENCOUNTER — Ambulatory Visit
Admission: RE | Admit: 2016-11-06 | Discharge: 2016-11-06 | Disposition: A | Discharge status: Home / Self Care | Payer: 59 | Source: Ambulatory Visit | Attending: Radiation Oncology | Admitting: Radiation Oncology

## 2016-11-06 DIAGNOSIS — Z510 Encounter for antineoplastic radiation therapy: Secondary | ICD-10-CM | POA: Diagnosis not present

## 2016-11-06 NOTE — Telephone Encounter (Signed)
Called patient's friend, Chong Sicilian to inform her that patient is folic acid and 0000000 deficient.  MD recommends folic acid 1 mg daily and OTC B-12. If patient would rather have B-12 injections they are instructed to call us back for prescription.  Patty called back to say they will try OTC B-12 for now and will pick up the folic acid as well.

## 2016-11-06 NOTE — Telephone Encounter (Signed)
-----   Message from Lequita Asal, MD sent at 11/06/2016  3:39 AM EST ----- Regarding: Please call patient  Patient B12 and folate deficient.  Begin folic acid 1 mg a day.  Can start B12 shots or try oral B12.  M   ----- Message ----- From: Interface, Lab In [place] Sent: 11/05/2016   5:05 PM To: Lequita Asal, MD

## 2016-11-07 ENCOUNTER — Ambulatory Visit: Payer: 59

## 2016-11-07 ENCOUNTER — Ambulatory Visit
Admission: RE | Admit: 2016-11-07 | Discharge: 2016-11-07 | Disposition: A | Discharge status: Home / Self Care | Payer: 59 | Source: Ambulatory Visit | Attending: Radiation Oncology | Admitting: Radiation Oncology

## 2016-11-07 DIAGNOSIS — Z510 Encounter for antineoplastic radiation therapy: Secondary | ICD-10-CM | POA: Diagnosis not present

## 2016-11-08 ENCOUNTER — Ambulatory Visit: Payer: 59

## 2016-11-08 ENCOUNTER — Ambulatory Visit
Admission: RE | Admit: 2016-11-08 | Discharge: 2016-11-08 | Disposition: A | Discharge status: Home / Self Care | Payer: 59 | Source: Ambulatory Visit | Attending: Radiation Oncology | Admitting: Radiation Oncology

## 2016-11-08 DIAGNOSIS — Z510 Encounter for antineoplastic radiation therapy: Secondary | ICD-10-CM | POA: Diagnosis not present

## 2016-11-09 ENCOUNTER — Ambulatory Visit: Payer: 59

## 2016-11-12 ENCOUNTER — Ambulatory Visit
Admission: RE | Admit: 2016-11-12 | Discharge: 2016-11-12 | Disposition: A | Discharge status: Home / Self Care | Payer: 59 | Source: Ambulatory Visit | Attending: Radiation Oncology | Admitting: Radiation Oncology

## 2016-11-12 ENCOUNTER — Ambulatory Visit: Payer: 59

## 2016-11-12 DIAGNOSIS — Z510 Encounter for antineoplastic radiation therapy: Secondary | ICD-10-CM | POA: Diagnosis not present

## 2016-11-13 ENCOUNTER — Ambulatory Visit
Admission: RE | Admit: 2016-11-13 | Discharge: 2016-11-13 | Disposition: A | Discharge status: Home / Self Care | Payer: 59 | Source: Ambulatory Visit | Attending: Radiation Oncology | Admitting: Radiation Oncology

## 2016-11-13 DIAGNOSIS — Z510 Encounter for antineoplastic radiation therapy: Secondary | ICD-10-CM | POA: Diagnosis not present

## 2016-11-14 ENCOUNTER — Other Ambulatory Visit: Payer: Self-pay | Admitting: Hematology and Oncology

## 2016-11-14 ENCOUNTER — Ambulatory Visit
Admission: RE | Admit: 2016-11-14 | Discharge: 2016-11-14 | Disposition: A | Discharge status: Home / Self Care | Payer: 59 | Source: Ambulatory Visit | Attending: Radiation Oncology | Admitting: Radiation Oncology

## 2016-11-14 ENCOUNTER — Ambulatory Visit
Admission: RE | Admit: 2016-11-14 | Discharge: 2016-11-14 | Disposition: A | Discharge status: Home / Self Care | Payer: 59 | Source: Ambulatory Visit | Attending: Hematology and Oncology | Admitting: Hematology and Oncology

## 2016-11-14 ENCOUNTER — Ambulatory Visit: Payer: 59

## 2016-11-14 DIAGNOSIS — C661 Malignant neoplasm of right ureter: Secondary | ICD-10-CM | POA: Diagnosis present

## 2016-11-14 DIAGNOSIS — N261 Atrophy of kidney (terminal): Secondary | ICD-10-CM | POA: Diagnosis not present

## 2016-11-14 DIAGNOSIS — R1903 Right lower quadrant abdominal swelling, mass and lump: Secondary | ICD-10-CM | POA: Insufficient documentation

## 2016-11-14 DIAGNOSIS — Z510 Encounter for antineoplastic radiation therapy: Secondary | ICD-10-CM | POA: Diagnosis not present

## 2016-11-15 ENCOUNTER — Other Ambulatory Visit: Payer: Self-pay | Admitting: *Deleted

## 2016-11-15 ENCOUNTER — Ambulatory Visit: Payer: 59

## 2016-11-15 ENCOUNTER — Encounter: Payer: Self-pay | Admitting: Hematology and Oncology

## 2016-11-15 ENCOUNTER — Telehealth: Payer: Self-pay | Admitting: *Deleted

## 2016-11-15 MED ORDER — OXYCODONE-ACETAMINOPHEN 5-325 MG PO TABS: 1.0000 | ORAL_TABLET | ORAL | 0 refills | Status: DC | PRN

## 2016-11-15 NOTE — Telephone Encounter (Signed)
Called Patty patient's partner and informed her that the prescription for pain meds would be at registration for them today, voiced understanding.

## 2016-11-16 ENCOUNTER — Other Ambulatory Visit: Payer: Self-pay | Admitting: *Deleted

## 2016-11-16 ENCOUNTER — Telehealth: Payer: Self-pay | Admitting: *Deleted

## 2016-11-16 ENCOUNTER — Ambulatory Visit: Payer: 59

## 2016-11-16 ENCOUNTER — Ambulatory Visit
Admission: RE | Admit: 2016-11-16 | Discharge: 2016-11-16 | Disposition: A | Discharge status: Home / Self Care | Payer: 59 | Source: Ambulatory Visit | Attending: Radiation Oncology | Admitting: Radiation Oncology

## 2016-11-16 DIAGNOSIS — C661 Malignant neoplasm of right ureter: Secondary | ICD-10-CM

## 2016-11-16 DIAGNOSIS — Z510 Encounter for antineoplastic radiation therapy: Secondary | ICD-10-CM | POA: Diagnosis not present

## 2016-11-16 NOTE — Telephone Encounter (Signed)
Called Precious Bard patient's partner and she reports that the patient is taking B12, folate and iron, has 3 more radiation treatments and they would like to see Dr. Mike Gip, appt given for next Wednesday at 1345, voiced understanding.

## 2016-11-16 NOTE — Telephone Encounter (Signed)
-----   Message from Lequita Asal, MD sent at 11/16/2016  9:39 AM EST -----  Is she bleeding?  Is she taking her B12 and folate?  She may want to be set up for a transfusion.  Is she finished radiation?  I should see her early next week to discuss where they want to go from here (scans, further treatment, hospice).  M  ----- Message ----- From: Donnita Falls, RN Sent: 11/16/2016   9:32 AM To: Lequita Asal, MD  Dr. Mike Gip, CC's H&H dropped significantly in 4 days (2/15-2/19). Thoughts about this? Also, we are interested in what CAT scan is showing. No energy with pain that is debilitating.  This came in pt email as well,  Please advise  tracie

## 2016-11-19 ENCOUNTER — Ambulatory Visit: Payer: 59

## 2016-11-19 ENCOUNTER — Ambulatory Visit
Admission: RE | Admit: 2016-11-19 | Discharge: 2016-11-19 | Disposition: A | Discharge status: Home / Self Care | Payer: 59 | Source: Ambulatory Visit | Attending: Radiation Oncology | Admitting: Radiation Oncology

## 2016-11-19 ENCOUNTER — Encounter: Payer: Self-pay | Admitting: Hematology and Oncology

## 2016-11-19 DIAGNOSIS — G893 Neoplasm related pain (acute) (chronic): Secondary | ICD-10-CM | POA: Insufficient documentation

## 2016-11-19 DIAGNOSIS — Z510 Encounter for antineoplastic radiation therapy: Secondary | ICD-10-CM | POA: Diagnosis not present

## 2016-11-20 ENCOUNTER — Ambulatory Visit
Admission: RE | Admit: 2016-11-20 | Discharge: 2016-11-20 | Disposition: A | Discharge status: Home / Self Care | Payer: 59 | Source: Ambulatory Visit | Attending: Radiation Oncology | Admitting: Radiation Oncology

## 2016-11-20 ENCOUNTER — Ambulatory Visit: Payer: 59

## 2016-11-20 DIAGNOSIS — Z510 Encounter for antineoplastic radiation therapy: Secondary | ICD-10-CM | POA: Diagnosis not present

## 2016-11-21 ENCOUNTER — Ambulatory Visit: Payer: 59

## 2016-11-21 ENCOUNTER — Encounter: Payer: Self-pay | Admitting: Hematology and Oncology

## 2016-11-21 ENCOUNTER — Ambulatory Visit
Admission: RE | Admit: 2016-11-21 | Discharge: 2016-11-21 | Disposition: A | Discharge status: Home / Self Care | Payer: 59 | Source: Ambulatory Visit | Attending: Radiation Oncology | Admitting: Radiation Oncology

## 2016-11-21 ENCOUNTER — Inpatient Hospital Stay (HOSPITAL_BASED_OUTPATIENT_CLINIC_OR_DEPARTMENT_OTHER): Payer: 59 | Admitting: Hematology and Oncology

## 2016-11-21 ENCOUNTER — Inpatient Hospital Stay: Payer: 59 | Attending: Hematology and Oncology

## 2016-11-21 VITALS — BP 101/68 | HR 73 | Temp 97.8°F | Resp 18 | Wt 127.4 lb

## 2016-11-21 DIAGNOSIS — N261 Atrophy of kidney (terminal): Secondary | ICD-10-CM

## 2016-11-21 DIAGNOSIS — G893 Neoplasm related pain (acute) (chronic): Secondary | ICD-10-CM | POA: Insufficient documentation

## 2016-11-21 DIAGNOSIS — C786 Secondary malignant neoplasm of retroperitoneum and peritoneum: Secondary | ICD-10-CM | POA: Diagnosis not present

## 2016-11-21 DIAGNOSIS — Z923 Personal history of irradiation: Secondary | ICD-10-CM | POA: Diagnosis not present

## 2016-11-21 DIAGNOSIS — R634 Abnormal weight loss: Secondary | ICD-10-CM | POA: Insufficient documentation

## 2016-11-21 DIAGNOSIS — M129 Arthropathy, unspecified: Secondary | ICD-10-CM

## 2016-11-21 DIAGNOSIS — K589 Irritable bowel syndrome without diarrhea: Secondary | ICD-10-CM

## 2016-11-21 DIAGNOSIS — Z86010 Personal history of colonic polyps: Secondary | ICD-10-CM

## 2016-11-21 DIAGNOSIS — J45909 Unspecified asthma, uncomplicated: Secondary | ICD-10-CM

## 2016-11-21 DIAGNOSIS — C661 Malignant neoplasm of right ureter: Secondary | ICD-10-CM | POA: Diagnosis not present

## 2016-11-21 DIAGNOSIS — Z9221 Personal history of antineoplastic chemotherapy: Secondary | ICD-10-CM

## 2016-11-21 DIAGNOSIS — Z87440 Personal history of urinary (tract) infections: Secondary | ICD-10-CM | POA: Diagnosis not present

## 2016-11-21 DIAGNOSIS — R000 Tachycardia, unspecified: Secondary | ICD-10-CM

## 2016-11-21 DIAGNOSIS — I4891 Unspecified atrial fibrillation: Secondary | ICD-10-CM

## 2016-11-21 DIAGNOSIS — Z86718 Personal history of other venous thrombosis and embolism: Secondary | ICD-10-CM

## 2016-11-21 DIAGNOSIS — Z7901 Long term (current) use of anticoagulants: Secondary | ICD-10-CM | POA: Insufficient documentation

## 2016-11-21 DIAGNOSIS — F329 Major depressive disorder, single episode, unspecified: Secondary | ICD-10-CM | POA: Diagnosis not present

## 2016-11-21 DIAGNOSIS — Z79899 Other long term (current) drug therapy: Secondary | ICD-10-CM | POA: Diagnosis not present

## 2016-11-21 DIAGNOSIS — F419 Anxiety disorder, unspecified: Secondary | ICD-10-CM | POA: Insufficient documentation

## 2016-11-21 DIAGNOSIS — M797 Fibromyalgia: Secondary | ICD-10-CM | POA: Insufficient documentation

## 2016-11-21 DIAGNOSIS — M5030 Other cervical disc degeneration, unspecified cervical region: Secondary | ICD-10-CM | POA: Insufficient documentation

## 2016-11-21 DIAGNOSIS — C7951 Secondary malignant neoplasm of bone: Secondary | ICD-10-CM

## 2016-11-21 DIAGNOSIS — D519 Vitamin B12 deficiency anemia, unspecified: Secondary | ICD-10-CM

## 2016-11-21 DIAGNOSIS — B1920 Unspecified viral hepatitis C without hepatic coma: Secondary | ICD-10-CM | POA: Diagnosis not present

## 2016-11-21 DIAGNOSIS — D72829 Elevated white blood cell count, unspecified: Secondary | ICD-10-CM | POA: Diagnosis not present

## 2016-11-21 DIAGNOSIS — K219 Gastro-esophageal reflux disease without esophagitis: Secondary | ICD-10-CM

## 2016-11-21 DIAGNOSIS — J449 Chronic obstructive pulmonary disease, unspecified: Secondary | ICD-10-CM | POA: Insufficient documentation

## 2016-11-21 DIAGNOSIS — R1031 Right lower quadrant pain: Secondary | ICD-10-CM

## 2016-11-21 DIAGNOSIS — Z510 Encounter for antineoplastic radiation therapy: Secondary | ICD-10-CM | POA: Diagnosis not present

## 2016-11-21 DIAGNOSIS — E538 Deficiency of other specified B group vitamins: Secondary | ICD-10-CM

## 2016-11-21 LAB — CBC WITH DIFFERENTIAL/PLATELET
Basophils Absolute: 0.1 10*3/uL (ref 0–0.1)
Basophils Relative: 1 %
Eosinophils Absolute: 0.4 10*3/uL (ref 0–0.7)
Eosinophils Relative: 6 %
HCT: 28.9 % — ABNORMAL LOW (ref 35.0–47.0)
Hemoglobin: 9.7 g/dL — ABNORMAL LOW (ref 12.0–16.0)
Lymphocytes Relative: 30 %
Lymphs Abs: 1.8 10*3/uL (ref 1.0–3.6)
MCH: 31 pg (ref 26.0–34.0)
MCHC: 33.6 g/dL (ref 32.0–36.0)
MCV: 92.5 fL (ref 80.0–100.0)
Monocytes Absolute: 0.3 10*3/uL (ref 0.2–0.9)
Monocytes Relative: 6 %
Neutro Abs: 3.5 10*3/uL (ref 1.4–6.5)
Neutrophils Relative %: 57 %
Platelets: 250 10*3/uL (ref 150–440)
RBC: 3.12 MIL/uL — ABNORMAL LOW (ref 3.80–5.20)
RDW: 19.4 % — ABNORMAL HIGH (ref 11.5–14.5)
WBC: 6 10*3/uL (ref 3.6–11.0)

## 2016-11-21 LAB — COMPREHENSIVE METABOLIC PANEL
ALT: 7 U/L — ABNORMAL LOW (ref 14–54)
AST: 15 U/L (ref 15–41)
Albumin: 3.6 g/dL (ref 3.5–5.0)
Alkaline Phosphatase: 57 U/L (ref 38–126)
Anion gap: 10 (ref 5–15)
BUN: 13 mg/dL (ref 6–20)
CO2: 22 mmol/L (ref 22–32)
Calcium: 9.5 mg/dL (ref 8.9–10.3)
Chloride: 106 mmol/L (ref 101–111)
Creatinine, Ser: 1.38 mg/dL — ABNORMAL HIGH (ref 0.44–1.00)
GFR calc Af Amer: 46 mL/min — ABNORMAL LOW (ref >60)
GFR calc non Af Amer: 40 mL/min — ABNORMAL LOW (ref >60)
Glucose, Bld: 99 mg/dL (ref 65–99)
Potassium: 4.1 mmol/L (ref 3.5–5.1)
Sodium: 138 mmol/L (ref 135–145)
Total Bilirubin: 0.2 mg/dL — ABNORMAL LOW (ref 0.3–1.2)
Total Protein: 7 g/dL (ref 6.5–8.1)

## 2016-11-21 LAB — SAMPLE TO BLOOD BANK

## 2016-11-21 MED ORDER — FENTANYL 50 MCG/HR TD PT72: 50.0000 ug | MEDICATED_PATCH | TRANSDERMAL | 0 refills | Status: DC

## 2016-11-21 NOTE — Progress Notes (Signed)
Patient is asking about nephrostomy bags.  States she needs a new one.  The current one is smelling.  Advised her to call nephrologist.  States she would like to have the tube removed.  States she is having pain in abdomen 5/10.  Also requesting refill for Fentanyl 50 mcg.

## 2016-11-21 NOTE — Progress Notes (Signed)
[facility] day:  11/21/2016   Chief Complaint: Sandra Landry is a 66 y.o. female with metastatic right ureteral cancer who is seen for reassessment near the end of palliative radiation.  HPI: The patient was last seen in the medical oncology clinic on 11/05/2016.  At that time, she was undergoing palliative radiation.  Pain had not improved.  She was interested in palliative care medicine and massage therapy.  She was to follow-up with Dr. Erlene Quan about potential right nephrostomy tube removal.  Restaging scans were ordered.  Chest, abdomen, and pelvic CT scan on 10/25/2016 revealed mild decrease in size of right lower quadrant retroperitoneal and adnexal mass. Although mildly decreased in size, there was increased osseous destruction of the adjacent right S1 vertebra.  There was resolution of right hydronephrosis status post percutaneous nephrostomy tube placement. There was stable diffuse right renal atrophy.  There was no evidence of metastatic disease within the chest or abdomen  She has 1-2 fractions of radiation left.  Pain is fairly well controlled with Fentanyl 50 mcg/hr patch.  Pain is in the "bladder area" as well as the sacral area.  She says that "massage is not feasible".  Her partner has "not done her homework about palliative care".  She is still considering treatment (Tecentriq) after radiation is complete.  She is up doing things, but spends the majority of her day resting ands watching TV.  She is eating fairly well.  Weight has been maintained.  She is taking folate 800 mcg a day and vitamin B12 2000 mcg a day.  She is still considering taking the nephrostomy tube out as she can "only sleep on her left side" and it "stinks".   Past Medical History:  Diagnosis Date  . Anxiety   . Arthritis   . Asthma    mild  . Cervical disc disease   . COPD (chronic obstructive pulmonary disease) (HCC)    mild  . Depression   . Dysrhythmia     tachycardia  . Fibromyalgia   . GERD (gastroesophageal reflux disease)   . Hepatitis C     Past Surgical History:  Procedure Laterality Date  . cervical discectomy with fusion    . COLONOSCOPY  01/2016  . EUS N/A 06/21/2016   Procedure: LOWER ENDOSCOPIC ULTRASOUND (EUS);  Surgeon: Reita Cliche, MD;  Location: [facility] ENDOSCOPY;  Service: Endoscopy;  Laterality: N/A;    No family history on file.  Social History:  reports that she has never smoked. She has never used smokeless tobacco. She reports that she uses drugs, including Marijuana. She reports that she does not drink alcohol.  She lives in [place].  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  She has also worked in the Physicist, medical.  Her partner is Darliss Ridgel, a Marine scientist.  The patient lives in [place].  Allergies:  Allergies  Allergen Reactions  . Other Swelling    Dust, ragweed  . Iodides   . Tetanus Toxoids Other (See Comments)    Localized pain and swelling  . Iodine Other (See Comments) and Rash    Limited history. Occurred in 23s, at age 29-12, received contrast at public health facility in [place], while being evaluated for meningitis. Immediate rash - not itchy, "flat, red, perfect circles" over entire body. Has not received iodine contrast since.    Current Medications: Current Outpatient Prescriptions  Medication Sig Dispense Refill  . baclofen (LIORESAL) 20 MG tablet Take 20  mg by mouth 4 (four) times daily as needed for muscle spasms.     . cetirizine (ZYRTEC) 10 MG tablet Take 10 mg by mouth daily.     Marland Kitchen diltiazem (CARDIZEM CD) 120 MG 24 hr capsule Take 120 mg by mouth daily.     . diphenhydrAMINE (BENADRYL) 25 MG tablet Take 25-50 mg by mouth every 6 (six) hours as needed.    Marland Kitchen escitalopram (LEXAPRO) 20 MG tablet Take 20 mg by mouth daily.     Marland Kitchen ibuprofen (ADVIL,MOTRIN) 200 MG tablet Take 200-600 mg by mouth every 6 (six) hours as needed for mild pain or moderate pain.    Marland Kitchen lactulose (CHRONULAC) 10 GM/15ML  solution     . oxyCODONE-acetaminophen (PERCOCET/ROXICET) 5-325 MG tablet     . polyethylene glycol (MIRALAX / GLYCOLAX) packet Take 17 g by mouth daily as needed.     . prochlorperazine (COMPAZINE) 5 MG/ML injection Inject 5-10 mg into the vein as directed. Every 4 to 6 hours as needed.    . promethazine (PHENERGAN) 25 MG suppository Place 25 mg rectally as directed. Every 4 to 6 hours as needed    . ranitidine (ZANTAC) 300 MG tablet Take 300 mg by mouth daily.     . traMADol (ULTRAM) 50 MG tablet Take 50-100 mg by mouth every 4 (four) hours as needed.     . potassium chloride (K-DUR) 10 MEQ tablet Take 2 tablets (20 mEq total) by mouth daily. 28 tablet 0  . potassium chloride (MICRO-K) 10 MEQ CR capsule      No current facility-administered medications for this visit.     Review of Systems:  GENERAL:  Fatigue.  No fever or sweats.  Weight stable. PERFORMANCE STATUS (ECOG):  2 HEENT:  Decreased vision in left eye since fall (no change).  Photophobia.  No runny nose, sore throat, mouth sores or tenderness. Lungs:  No shortness of breath or cough.  No hemoptysis.  Singultus. Cardiac:  No chest pain, palpitations, orthopnea, or PND. GI:  Eating better. No diarrhea, constipationmelena or hematochezia. GU:  Right urostomy tube (still wants out).  Exit site clean.  No urgency, frequency, or dysuria. Musculoskeletal:  Right sided back pain.  No joint pain.  No muscle tenderness. Extremities:  No pain or swelling. Skin:  No rashes or skin changes. Neuro:  General weakness.  No focal numbness or weakness, or coordination issues. Endocrine:  No diabetes, thyroid issues, hot flashes or night sweats. Psych:  Hypersensitive.  PTSD.  Depression.  Anxiety. Pain:  Sacral pain (see HPI). Review of systems:  All other systems reviewed and found to be negative.  Physical Exam: BP 101/68 (BP Location: Left Arm, Patient Position: Sitting)   Pulse 73   Temp 97.8 F (36.6 C) (Tympanic)   Resp 18   Wt  127 lb 6.8 oz (57.8 kg)   BMI 20.57 kg/m  GENERAL:  Thin, chronically fatigued appearing woman sitting comfortably in the exam room in no acute distress.  She walked into clinic. MENTAL STATUS:  Alert and oriented to person, place and time. HEAD:  Short brown hair.  Normocephalic, atraumatic, face symmetric, no Cushingoid features. EYES:  Wearing dark sunglasses.  Pupils equal round and reactive to light and accomodation.  No conjunctivitis or scleral icterus. ENT:  Oropharynx clear without lesion.  No upper teeth.  Tongue normal. Mucous membranes moist.  RESPIRATORY:  Clear to auscultation without rales, wheezes or rhonchi. CARDIOVASCULAR:  Regular rate and rhythm without murmur, rub or gallop.  ABDOMEN:  Soft, nontender with active bowel sounds and no hepatosplenomegaly.   BACK:  Right sided urostomy with dressing in place. SKIN:  No rashes, ulcers or lesions. EXTREMITIES: No skin discoloration or tenderness.  No palpable cords. LYMPH NODES: No palpable cervical, supraclavicular, axillary or inguinal adenopathy  NEUROLOGICAL:  Appropriate. PSYCH:  Appropriate.   Appointment on 11/21/2016  Component Date Value Ref Range Status  . Sodium 11/21/2016 138  135 - 145 mmol/L Final  . Potassium 11/21/2016 4.1  3.5 - 5.1 mmol/L Final  . Chloride 11/21/2016 106  101 - 111 mmol/L Final  . CO2 11/21/2016 22  22 - 32 mmol/L Final  . Glucose, Bld 11/21/2016 99  65 - 99 mg/dL Final  . BUN 11/21/2016 13  6 - 20 mg/dL Final  . Creatinine, Ser 11/21/2016 1.38* 0.44 - 1.00 mg/dL Final  . Calcium 11/21/2016 9.5  8.9 - 10.3 mg/dL Final  . Total Protein 11/21/2016 7.0  6.5 - 8.1 g/dL Final  . Albumin 11/21/2016 3.6  3.5 - 5.0 g/dL Final  . AST 11/21/2016 15  15 - 41 U/L Final  . ALT 11/21/2016 7* 14 - 54 U/L Final  . Alkaline Phosphatase 11/21/2016 57  38 - 126 U/L Final  . Total Bilirubin 11/21/2016 0.2* 0.3 - 1.2 mg/dL Final  . GFR calc non Af Amer 11/21/2016 40* >60 mL/min Final  . GFR calc Af Amer  11/21/2016 46* >60 mL/min Final   Comment: (NOTE) The eGFR has been calculated using the CKD EPI equation. This calculation has not been validated in all clinical situations. eGFR's persistently <60 mL/min signify possible Chronic Kidney Disease.   . Anion gap 11/21/2016 10  5 - 15 Final  . WBC 11/21/2016 6.0  3.6 - 11.0 K/uL Final  . RBC 11/21/2016 3.12* 3.80 - 5.20 MIL/uL Final  . Hemoglobin 11/21/2016 9.7* 12.0 - 16.0 g/dL Final  . HCT 11/21/2016 28.9* 35.0 - 47.0 % Final  . MCV 11/21/2016 92.5  80.0 - 100.0 fL Final  . MCH 11/21/2016 31.0  26.0 - 34.0 pg Final  . MCHC 11/21/2016 33.6  32.0 - 36.0 g/dL Final  . RDW 11/21/2016 19.4* 11.5 - 14.5 % Final  . Platelets 11/21/2016 250  150 - 440 K/uL Final  . Neutrophils Relative % 11/21/2016 57  % Final  . Neutro Abs 11/21/2016 3.5  1.4 - 6.5 K/uL Final  . Lymphocytes Relative 11/21/2016 30  % Final  . Lymphs Abs 11/21/2016 1.8  1.0 - 3.6 K/uL Final  . Monocytes Relative 11/21/2016 6  % Final  . Monocytes Absolute 11/21/2016 0.3  0.2 - 0.9 K/uL Final  . Eosinophils Relative 11/21/2016 6  % Final  . Eosinophils Absolute 11/21/2016 0.4  0 - 0.7 K/uL Final  . Basophils Relative 11/21/2016 1  % Final  . Basophils Absolute 11/21/2016 0.1  0 - 0.1 K/uL Final    No visits with results within 3 Day(s) from this visit.  Latest known visit with results is:  Admission on 06/21/2016, Discharged on 06/21/2016  Component Date Value Ref Range Status  . SURGICAL PATHOLOGY 06/27/2016    Final                   Value:Surgical Pathology CASE: [id number] PATIENT: Katalea Batt Surgical Pathology Report     SPECIMEN SUBMITTED: A. Recto-sigmoid mass; cbx  CLINICAL HISTORY: None provided  PRE-OPERATIVE DIAGNOSIS: Right pelvic retroperitoneal mass.  Needs biopsy  POST-OPERATIVE DIAGNOSIS: Invasive peri-rectal mass  DIAGNOSIS: A. RECTOSIGMOID MASS; COLD BIOPSY: - UROTHELIAL CARCINOMA UNDERMINING COLONIC MUCOSA.  Comment:  A  limited panel of immunohistochemical stains was performed. The neoplastic cells have the following immunoreactivity: Super pancytokeratin: Positive p40: Positive GATA-3: Positive SOX-1: Negative CD45: Negative Vimentin: Negative DOG-1: Negative PAX-8: Negative CDX-2: Negative p16: Negative (high background) These findings support the above diagnosis. These results were communicated to Dr. Mike Gip on 06/27/2016. Stain controls worked appropriately.  GROSS DESCRIPTION: A. Labeled: rectosigmoid mass C BX Tissue fragment(s): multiple S                         ize: aggregate, 0.8 x 0.6 x 0.1 cm Description: tan fragments  Entirely submitted in 1 cassette(s).  Final Diagnosis performed by Quay Burow, MD.  Electronically signed 06/27/2016 4:00:48PM    The electronic signature indicates that the named Attending Pathologist has evaluated the specimen  Technical component performed at [facility], [address], [place], [place] [address] Lab: [phone] Dir: Darrick Penna. Evette Doffing, MD  Professional component performed at [facility], [facility], [place], [place], [place] [address] Lab: [phone] Dir: Dellia Nims. Reuel Derby, MD      Assessment:  Kyle Stansell is a 66 y.o. female with metastatic right ureteral carcinoma s/p biopsy.  She has a history of lower abdomen for 2 years felt secondary to irritable bowel syndrome.  She has had RLQ pain which has extended to her right buttock and hip for 3 months.  She has lost 38 pounds in the past year.  Lower endoscopic ultrasound on 06/21/2016 revealed an infiltrative 5.6 x 4.5 cm non-obstructing large mass in the recto-sigmoid colon extendiing from 14 cm to 18 cm from the anal verge. The mass was non-circumferential.  Biopsies revealed a urothelial carcinoma.  Pathologic stage was T4NxM0.  Abdominal and pelvic CT scan on 05/14/2016 revealed a 5.7 cm right lower quadrant retroperitoneal mass highly concerning for  malignancy. The mass involved the sigmoid colon but is favored to reflect a primary retroperitoneal tumor (such as sarcoma) over a primary colonic mass.  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  There were multiple subcentimeter liver lesions, indeterminate.   PET scan on 06/06/2016 revealed hypermetabolism (SUV 19.7)  corresponding to the right lower quadrant retroperitoneal mass. It was felt intimately associated with the rectosigmoid colon, but originating outside of the colon. There was similar right-sided hydroureteronephrosis due to the right lower quadrant mass.  There was a 4 mm nodule in the left lower lobe (unclear significance).  Colonoscopy at [facility] on 07/18/2015 revealed 3 adenomatous polyps.  She underwent right nephrostomy tube placement on 07/11/2016  She has a normocytic anemia.  Labs on 07/26/2016 revealed a ferritin (284), iron saturation (4%), TIBC 179 (low) B12 (628), folate (7.1), and ESR (54).  She has had some blood from her nephrostomy tube.  She has anemia of chronic disease.  Diet is poor.  She received 1 unit PRBCs on 08/01/2016.  She has leukocytosis secondary to her underlying malignancy.  Work-up on 07/26/2016 revealed a negative urinalysis, blood cultures, and CXR.  Urinalysis revealed 20,000 colonies/ml Klebsiella pneumonia.  She received a course of ciprofloxacin.  Urine culture on 08/06/2016 revealed Stenotrophomonas maltophilia.  She was treated with a course of Levaquin.  Prior to treatment, she lost 35+ pounds.  She began Megace on 07/26/2016.  She has lost an additional 12 pounds with her recent admission to [facility].  She has received 2 cycles  of cisplatin and gemcitabine (07/30/2016 - 09/04/2016).  Cisplatin was given as a split dose (day 1 and 2).  Course was complicated by an admission to [facility] on 08/08/2016 following a fall.  Head MRI revealed a subarachnoid hemorrhage.  She had atrial fibrillation with  RVR.  Chest, abdomen, and pelvic CT scan on 08/08/2016 revealed a mild age-indeterminate compression fracture of T6. There was a 6 mm hypodensity at the dome of the liver too small to characterize and a few additional small subcentimeter hypodense scattered lesions in the liver. There was a 7.3 x 6.4 x 7.7 cm right pelvic mass invading the sacrum and causing a lytic lesion with pathologic fracture.  The mass likely invaded the right psoas muscle.  Left lower extremity duplex on 09/15/2016 revealed extensive left lower extremity DVT involving the entire visualized deep veins from the level of the common femoral vein to the calf veins.  An IVC filter was placed on 09/20/2015.  She is on Lovenox.  She is completing palliative radiation (1-2 fractions remaining).  Pain is controlled with Fentanyl 50 mcg/hr patch,  Chest, abdomen, and pelvic CT on 10/25/2016 revealed mild decrease in size of right lower quadrant retroperitoneal and adnexal mass. Although mildly decreased in size, there was increased osseous destruction of the adjacent right S1 vertebra.  There was resolution of right hydronephrosis status post percutaneous nephrostomy tube placement. There was stable diffuse right renal atrophy.  There was no evidence of metastatic disease within the chest or abdomen  She has a normocytic anemia.  Work-up on 11/05/2016 revealed B12 and folate deficiency.  She is on oral H21 and folic acid.  Hematocrit was 23.6 with a hemoglobin of 8.0.  B12 was 201.  Folate was 5.8.  Creatinine was 1.65.  Ferritin was 474 with 17% iron sat and TIBC of 230.  ESR was 83.  Symptomatically, her pain is controlled.  Weight is stable. Anemia has improved.  Plan: 1.  Review recent abdomen and pelvic CT scan.  Review films with Dr Baruch Gouty right S1 vertebra (? in field of radiation). 2.  Discuss patient's thoughts about initiation of Tecentriq after completion of radiation. Response rates and potential side effects reviewed.   Quality of life issues discussed.  Patient undecided. 3.  Schedule follow-up appt with Dr. Erlene Quan to discuss nephrostomy tube removal. 4.  Rx:  Fentanyl 50 mcg/hr patch (dis: #10). 5.  Continue oral B83 and folic acid. 6.  RTC in 2-3 weeks for MD assessment and labs (CBC with diff, BMP).   Lequita Asal, MD 11/21/2016, 2:03 PM

## 2016-11-22 ENCOUNTER — Ambulatory Visit
Admission: RE | Admit: 2016-11-22 | Discharge: 2016-11-22 | Disposition: A | Discharge status: Home / Self Care | Payer: 59 | Source: Ambulatory Visit | Attending: Radiation Oncology | Admitting: Radiation Oncology

## 2016-11-22 ENCOUNTER — Ambulatory Visit: Payer: 59 | Admitting: Hematology and Oncology

## 2016-11-22 ENCOUNTER — Ambulatory Visit: Payer: 59

## 2016-11-22 DIAGNOSIS — Z510 Encounter for antineoplastic radiation therapy: Secondary | ICD-10-CM | POA: Diagnosis not present

## 2016-11-25 ENCOUNTER — Other Ambulatory Visit: Payer: Self-pay | Admitting: Hematology and Oncology

## 2016-11-28 ENCOUNTER — Encounter: Payer: Self-pay | Admitting: Radiation Oncology

## 2016-11-29 ENCOUNTER — Other Ambulatory Visit: Payer: Self-pay | Admitting: *Deleted

## 2016-11-29 ENCOUNTER — Ambulatory Visit
Admission: RE | Admit: 2016-11-29 | Discharge: 2016-11-29 | Disposition: A | Discharge status: Home / Self Care | Payer: 59 | Source: Ambulatory Visit | Attending: Hematology and Oncology | Admitting: Hematology and Oncology

## 2016-11-29 ENCOUNTER — Other Ambulatory Visit: Payer: Self-pay | Admitting: Hematology and Oncology

## 2016-11-29 DIAGNOSIS — M79604 Pain in right leg: Secondary | ICD-10-CM

## 2016-11-29 DIAGNOSIS — I82412 Acute embolism and thrombosis of left femoral vein: Secondary | ICD-10-CM

## 2016-11-29 DIAGNOSIS — I82512 Chronic embolism and thrombosis of left femoral vein: Secondary | ICD-10-CM | POA: Insufficient documentation

## 2016-11-29 DIAGNOSIS — C661 Malignant neoplasm of right ureter: Secondary | ICD-10-CM

## 2016-11-29 MED ORDER — OXYCODONE-ACETAMINOPHEN 5-325 MG PO TABS: 1.0000 | ORAL_TABLET | ORAL | 0 refills | Status: DC | PRN

## 2016-11-29 MED ORDER — FENTANYL 25 MCG/HR TD PT72: 25.0000 ug | MEDICATED_PATCH | TRANSDERMAL | 0 refills | Status: DC

## 2016-11-29 MED ORDER — ENOXAPARIN SODIUM 60 MG/0.6ML ~~LOC~~ SOLN: 60.0000 mg | Freq: Two times a day (BID) | SUBCUTANEOUS | 2 refills | Status: AC

## 2016-11-29 NOTE — Telephone Encounter (Signed)
Requesting refill of her Percocet, she is using #90 every 2 weeks and is also on Fentanyl 50 mcg. Rates her pain at 6 - 8 most of the time. She has a new pain in her right leg from her groin to knee, it does feel a little firmer thatn the left leg. Please advise

## 2016-11-29 NOTE — Telephone Encounter (Signed)
Dr Mike Gip, patient is to be on lifelong anticoagulation. Patty reported that she is no longer on her Lovenox "we were not told to continue it, so when she ran out we stopped it." Advised that she is to be on it forever and she said "OH!" Offered to go on Eliquis or Xarelto orally or to stay on Lovenox, but the oral can not be reversed if she has another bleed. She spoke with CC and they decided to continue the Lovenox. Per Dr Mike Gip can go up on the Fentanyl to 75 mcg if patient agrees. She was in agreement with increasing the fentanyl, I explained that we would add 25 mcg patch to what she has and she will wear 2 patches until she runs out, then we will write for a 75 mcg patch and she will only wear 1 patch at that time. She voiced understanding. Doppler ordered stat of RLE She will come to [facility] since she needs to pick up prescriptions.Marland Kitchen

## 2016-12-03 ENCOUNTER — Inpatient Hospital Stay: Payer: 59

## 2016-12-06 ENCOUNTER — Encounter: Payer: Self-pay | Admitting: Urology

## 2016-12-06 ENCOUNTER — Other Ambulatory Visit: Payer: Self-pay | Admitting: *Deleted

## 2016-12-06 ENCOUNTER — Encounter: Payer: Self-pay | Admitting: Hematology and Oncology

## 2016-12-06 ENCOUNTER — Ambulatory Visit (INDEPENDENT_AMBULATORY_CARE_PROVIDER_SITE_OTHER): Payer: 59 | Admitting: Urology

## 2016-12-06 VITALS — BP 105/72 | HR 98 | Ht 66.0 in | Wt 131.0 lb

## 2016-12-06 DIAGNOSIS — N261 Atrophy of kidney (terminal): Secondary | ICD-10-CM | POA: Diagnosis not present

## 2016-12-06 DIAGNOSIS — R1900 Intra-abdominal and pelvic swelling, mass and lump, unspecified site: Secondary | ICD-10-CM

## 2016-12-06 DIAGNOSIS — N183 Chronic kidney disease, stage 3 unspecified: Secondary | ICD-10-CM

## 2016-12-06 DIAGNOSIS — C661 Malignant neoplasm of right ureter: Secondary | ICD-10-CM | POA: Diagnosis not present

## 2016-12-06 DIAGNOSIS — N1330 Unspecified hydronephrosis: Secondary | ICD-10-CM

## 2016-12-06 MED ORDER — CIPROFLOXACIN HCL 500 MG PO TABS: 500.0000 mg | ORAL_TABLET | Freq: Two times a day (BID) | ORAL | 0 refills | Status: DC

## 2016-12-06 NOTE — Progress Notes (Signed)
12/06/2016 2:08 PM   Sandra Landry 23-May-1951 [id number]  Referring provider: Lynnell Jude, MD [address] [place], [place] [address]  No chief complaint on file.   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 Although the size of her pelvic mass has decreased, she is developed progression of local patient disease with bony extension status post palliative radiation.  Her interval course has been complicated by development of DVT and a subarachnoid hemorrhage.  This point, no further chemotherapy will be administered. She may consider Tecentriq  Immunotherapy for palliation.  At the time of diagnosis, her right kidney was noted to be severely atrophic and obstructed.  In order to optimize her renal function, a right nephrostomy tube was placed.  Initially, her creatinine did return to near baseline at the time of nephrostomy tube placement but has bounced around since then likely related to hydration status and nephrotoxic chemotherapy. She's had very little urine output from her tube.  She has had several UTIs since nephrostomy tube placement. These have been Cipro sensitive.  Most recent CT abd/ pelvis in 11/14/16 shows resolution of the right hydronephrosis with persistent atrophy.    She returns to the office today to discuss nephrostomy tube removal.  She is anxious to have it removed in order to sleep on her back.   PMH: Past Medical History:  Diagnosis Date  . Anxiety   . Arthritis   . Asthma    mild  . Cancer (HCC)    urothelia  . Cervical disc disease   . COPD (chronic obstructive pulmonary disease) (HCC)    mild  . Depression   . Dysrhythmia    tachycardia  . Fibromyalgia   . GERD (gastroesophageal reflux disease)   . Hepatitis C   . History of hiatal hernia   . Intracerebral hemorrhage [facility])     Surgical History: Past Surgical History:  Procedure Laterality Date  . BREAST REDUCTION SURGERY    . cervical  discectomy with fusion    . COLONOSCOPY  01/2016  . EUS N/A 06/21/2016   Procedure: LOWER ENDOSCOPIC ULTRASOUND (EUS);  Surgeon: Reita Cliche, MD;  Location: [facility] ENDOSCOPY;  Service: Endoscopy;  Laterality: N/A;  . IR GENERIC HISTORICAL  07/11/2016   IR NEPHROSTOMY PLACEMENT RIGHT 07/11/2016 Greggory Keen, MD [facility]INTER[facility] RAD  . IR GENERIC HISTORICAL  08/31/2016   IR NEPHROSTOMY TUBE CHANGE 08/31/2016 [facility]  . IR GENERIC HISTORICAL  09/20/2016   IR NEPHROSTOMY TUBE CHANGE 09/20/2016 [facility]  . PERIPHERAL VASCULAR CATHETERIZATION N/A 07/23/2016   Procedure: Glori Luis Cath Insertion;  Surgeon: Algernon Huxley, MD;  Location: [place] CV LAB;  Service: Cardiovascular;  Laterality: N/A;  . PERIPHERAL VASCULAR CATHETERIZATION N/A 09/18/2016   Procedure: IVC Filter Insertion;  Surgeon: Katha Cabal, MD;  Location: [place] [facility];  Service: Cardiovascular;  Laterality: N/A;    Home Medications:  Allergies as of 12/06/2016      Reactions   Other Swelling   Dust, ragweed   Gabapentin Other (See Comments)   Jerking motions and confusion.   Iodides    Tetanus Toxoids Other (See Comments)   Localized pain and swelling   Iodine Other (See Comments), Rash   Limited history. Occurred in 11s, at age 53-12, received contrast at public health facility in [place], while being evaluated for meningitis. Immediate rash - not itchy, "flat, red, perfect circles" over entire body. Has not received iodine contrast since.  Medication List       Accurate as of 12/06/16  2:08 PM. Always use your most recent med list.          baclofen 20 MG tablet Commonly known as:  LIORESAL Take 20 mg by mouth 4 (four) times daily as needed for muscle spasms.   bisacodyl 5 MG EC tablet Commonly known as:  DULCOLAX Take 5 mg by mouth daily as needed for moderate constipation.   ciprofloxacin 500 MG tablet Commonly known as:  CIPRO Take 1 tablet (500 mg total) by mouth every 12 (twelve)  hours.   cyanocobalamin 2000 MCG tablet Take 2,000 mcg by mouth daily.   diltiazem 120 MG 24 hr capsule Commonly known as:  CARDIZEM CD Take 240 mg by mouth at bedtime.   diphenhydrAMINE 25 MG tablet Commonly known as:  BENADRYL Take 25-50 mg by mouth every 6 (six) hours as needed for allergies.   enoxaparin 60 MG/0.6ML injection Commonly known as:  LOVENOX Inject 0.6 mLs (60 mg total) into the skin every 12 (twelve) hours.   escitalopram 20 MG tablet Commonly known as:  LEXAPRO Take 40 mg by mouth at bedtime.   fentaNYL 25 MCG/HR patch Commonly known as:  DURAGESIC - dosed mcg/hr Place 1 patch (25 mcg total) onto the skin every 3 (three) days. Use with a 50 mcg patch for total dose of 75 mcg   folic acid 283 MCG tablet Commonly known as:  FOLVITE Take 400 mcg by mouth daily.   lactulose 10 GM/15ML solution Commonly known as:  CHRONULAC Take 10 g by mouth 2 (two) times daily.   lidocaine-prilocaine cream Commonly known as:  EMLA Apply cream to port site 1 hour prior to chemotherapy, place small amount of saran wrap to cover cream and protect clothing.   megestrol 400 MG/10ML suspension Commonly known as:  MEGACE Take 5 mLs (200 mg total) by mouth daily.   NEOMYCIN-POLYMYXIN-HYDROCORTISONE 1 % Soln otic solution Commonly known as:  CORTISPORIN INSTILL 2 DROPS TO AFFECTED EAR 4 TIMES DAILY   ondansetron 8 MG tablet Commonly known as:  ZOFRAN TAKE 1 TABLET (8 MG TOTAL) BY MOUTH EVERY SIX (6) HOURS AS NEEDED FOR NAUSEA.   oxyCODONE-acetaminophen 5-325 MG tablet Commonly known as:  PERCOCET/ROXICET Take 1 tablet by mouth every 4 (four) hours as needed for moderate pain or severe pain.   polyethylene glycol packet Commonly known as:  MIRALAX / GLYCOLAX Take 17 g by mouth daily as needed for mild constipation.   prochlorperazine 5 MG/ML injection Commonly known as:  COMPAZINE Inject 10 mg into the muscle every 4 (four) hours as needed (nausea).   promethazine 25  MG suppository Commonly known as:  PHENERGAN Place 25 mg rectally every 6 (six) hours as needed for nausea or vomiting.   ranitidine 300 MG tablet Commonly known as:  ZANTAC Take 300 mg by mouth 2 (two) times daily.   VITRON-C PO Take 1 tablet by mouth daily.       Allergies:  Allergies  Allergen Reactions  . Other Swelling    Dust, ragweed  . Gabapentin Other (See Comments)    Jerking motions and confusion.  . Iodides   . Tetanus Toxoids Other (See Comments)    Localized pain and swelling  . Iodine Other (See Comments) and Rash    Limited history. Occurred in 60s, at age 59-12, received contrast at public health facility in [place], while being evaluated for meningitis. Immediate rash - not itchy, "flat, red,  perfect circles" over entire body. Has not received iodine contrast since.    Family History: Family History  Problem Relation Age of Onset  . Cancer Neg Hx     Social History:  reports that she has never smoked. She has never used smokeless tobacco. She reports that she uses drugs, including Marijuana, about 14 times per week. She reports that she does not drink alcohol.  ROS: UROLOGY Frequent Urination?: No Hard to postpone urination?: No Burning/pain with urination?: No Get up at night to urinate?: No Leakage of urine?: No Urine stream starts and stops?: Yes Trouble starting stream?: Yes Do you have to strain to urinate?: Yes Blood in urine?: No Urinary tract infection?: No Sexually transmitted disease?: No Injury to kidneys or bladder?: No Painful intercourse?: No Weak stream?: No Currently pregnant?: No Vaginal bleeding?: No Last menstrual period?: n  Gastrointestinal Nausea?: Yes Vomiting?: No Indigestion/heartburn?: No Diarrhea?: No Constipation?: Yes  Constitutional Fever: No Night sweats?: No Weight loss?: No Fatigue?: Yes  Skin Skin rash/lesions?: No Itching?: No  Eyes Blurred vision?: Yes Double vision?:  Yes  Ears/Nose/Throat Sore throat?: No Sinus problems?: No  Hematologic/Lymphatic Swollen glands?: No Easy bruising?: No  Cardiovascular Leg swelling?: Yes Chest pain?: No  Respiratory Cough?: No Shortness of breath?: Yes  Endocrine Excessive thirst?: No  Musculoskeletal Back pain?: Yes Joint pain?: Yes  Neurological Headaches?: No Dizziness?: No  Psychologic Depression?: Yes Anxiety?: Yes  Physical Exam: BP 105/72   Pulse 98   Ht 5\' 6"  (1.676 m)   Wt 131 lb (59.4 kg)   BMI 21.14 kg/m   Constitutional:  Alert and oriented, No acute distress.  Thin and somewhat cachectic appearing.  Accompanied by partner today. HEENT: [place] AT, moist mucus membranes.  Trachea midline, no masses. Cardiovascular: No clubbing, cyanosis, or edema. Respiratory: Normal respiratory effort, no increased work of breathing. GI: Abdomen is soft, nontender, nondistended, no abdominal masses GU: No CVA tenderness. Right nephrostomy tube in place draining scant cloudy urine. Skin: No rashes, bruises or suspicious lesions. Neurologic: Grossly intact, no focal deficits, moving all 4 extremities. Psychiatric: Normal mood and affect.  Laboratory Data: Lab Results  Component Value Date   WBC 6.0 11/21/2016   HGB 9.7 (L) 11/21/2016   HCT 28.9 (L) 11/21/2016   MCV 92.5 11/21/2016   PLT 250 11/21/2016    Lab Results  Component Value Date   CREATININE 1.38 (H) 11/21/2016    Urinalysis n/a  Pertinent Imaging: CLINICAL DATA:  Followup metastatic right ureteral carcinoma. Undergoing radiation therapy. Worsening pelvic pain for several months.  EXAM: CT CHEST, ABDOMEN AND PELVIS WITHOUT CONTRAST  TECHNIQUE: Multidetector CT imaging of the chest, abdomen and pelvis was performed following the standard protocol without IV contrast.  COMPARISON:  PET-CT on 06/06/2016  FINDINGS: CT CHEST FINDINGS  Cardiovascular: No acute findings. Aortic and coronary  artery atherosclerosis.  Mediastinum/Lymph Nodes: No masses or pathologically enlarged lymph nodes identified on this unenhanced exam.  Lungs/Pleura: Stable 3 mm left lower lobe pulmonary nodule on image 98/5. No pulmonary infiltrate or mass identified. No effusion present.  Musculoskeletal:  No suspicious bone lesions identified.  CT ABDOMEN AND PELVIS FINDINGS  Hepatobiliary: Probable 7 cm peripheral right hepatic lobe cyst remains stable. No definite masses visualized on this unenhanced exam. Gallbladder is unremarkable.  Pancreas: No mass or inflammatory changes identified on this unenhanced exam.  Spleen: Within normal limits in size.  Adrenals/Urinary Tract: Right percutaneous nephrostomy tube is seen in appropriate position and there has been resolution  of right hydronephrosis since previous study. Diffuse right renal parenchymal atrophy again noted. Unremarkable appearance of left kidney on this unenhanced exam. Unremarkable appearance of bladder.  Stomach/Bowel: No evidence of obstruction, inflammatory process, or abnormal fluid collections.  Vascular/Lymphatic: No pathologically enlarged lymph nodes identified. No abdominal aortic aneurysm. IVC filter remains in appropriate position.  Reproductive: Soft tissue mass in the right lower quadrant retroperitoneum and right adnexal region currently measures 5.8 x 4.4 cm on image 96/2 compared to 6.4 x 5.4 cm previously. Although mildly decreased in size, there is increased osseous destruction of the right S1 vertebra.  Other:  None.  Musculoskeletal: Increased osseous destruction of right S1 vertebral adjacent to right lower quadrant retroperitoneal/presacral mass described above.  IMPRESSION: Mild decrease in size of right lower quadrant retroperitoneal and adnexal mass. Although mildly decreased in size, there is increased osseous destruction of the adjacent right S1 vertebra.  Resolution of  right hydronephrosis status post percutaneous nephrostomy tube placement. Stable diffuse right renal atrophy.  No evidence of metastatic disease within the chest or abdomen.   Electronically Signed   By: Earle Gell M.D.   On: 11/14/2016 14:12     Assessment & Plan:    1. Urothelial carcinoma of right distal ureter ([place]) Status post 2 cycles of gemcitabine/cisplatin and with multiple complications, chemotherapy DC'd Currently on palliative radiation for locally invasive disease involving bone Given that she will receive no further chemotherapy, role of nephrostomy tube is limited in a poorly functioning obstructed kidney She would like to have this removed primarily for comfort reasons which is reasonable Concerned about removing infected/colonized to a nonobstructed system, discussed the risk of infection Plan to Tube today and see how she does over the weekend. If doing well, we'll plan for tube removal early next week Cipro given today, I advised to start Cipro 1 day prior to planned nephrostomy tube removal next week  2. Hydronephrosis of right kidney status post right nephrostomy tube to optimize renal function As above  3. Right renal atrophy As above  4. CKD (chronic kidney disease) stage 3, GFR 30-59 ml/min Cr stable   Return for nurse visit  Monday or Tuesday for nephrostomy tube removal.  Hollice Espy, MD  [facility] [address], [place],  [address] [phone]

## 2016-12-07 ENCOUNTER — Inpatient Hospital Stay (HOSPITAL_BASED_OUTPATIENT_CLINIC_OR_DEPARTMENT_OTHER): Payer: 59 | Admitting: Hematology and Oncology

## 2016-12-07 ENCOUNTER — Ambulatory Visit
Admission: RE | Admit: 2016-12-07 | Discharge: 2016-12-07 | Disposition: A | Discharge status: Home / Self Care | Payer: 59 | Source: Ambulatory Visit | Attending: Hematology and Oncology | Admitting: Hematology and Oncology

## 2016-12-07 ENCOUNTER — Telehealth: Payer: Self-pay | Admitting: *Deleted

## 2016-12-07 ENCOUNTER — Inpatient Hospital Stay: Payer: 59

## 2016-12-07 VITALS — BP 112/75 | HR 79 | Temp 95.1°F | Resp 18 | Wt 134.1 lb

## 2016-12-07 DIAGNOSIS — J45909 Unspecified asthma, uncomplicated: Secondary | ICD-10-CM

## 2016-12-07 DIAGNOSIS — C661 Malignant neoplasm of right ureter: Secondary | ICD-10-CM | POA: Diagnosis not present

## 2016-12-07 DIAGNOSIS — N261 Atrophy of kidney (terminal): Secondary | ICD-10-CM

## 2016-12-07 DIAGNOSIS — I4891 Unspecified atrial fibrillation: Secondary | ICD-10-CM | POA: Diagnosis not present

## 2016-12-07 DIAGNOSIS — R1900 Intra-abdominal and pelvic swelling, mass and lump, unspecified site: Secondary | ICD-10-CM | POA: Insufficient documentation

## 2016-12-07 DIAGNOSIS — Z86718 Personal history of other venous thrombosis and embolism: Secondary | ICD-10-CM

## 2016-12-07 DIAGNOSIS — F329 Major depressive disorder, single episode, unspecified: Secondary | ICD-10-CM

## 2016-12-07 DIAGNOSIS — D72829 Elevated white blood cell count, unspecified: Secondary | ICD-10-CM

## 2016-12-07 DIAGNOSIS — F419 Anxiety disorder, unspecified: Secondary | ICD-10-CM

## 2016-12-07 DIAGNOSIS — M129 Arthropathy, unspecified: Secondary | ICD-10-CM | POA: Diagnosis not present

## 2016-12-07 DIAGNOSIS — G893 Neoplasm related pain (acute) (chronic): Secondary | ICD-10-CM | POA: Insufficient documentation

## 2016-12-07 DIAGNOSIS — Z79899 Other long term (current) drug therapy: Secondary | ICD-10-CM

## 2016-12-07 DIAGNOSIS — R000 Tachycardia, unspecified: Secondary | ICD-10-CM

## 2016-12-07 DIAGNOSIS — R634 Abnormal weight loss: Secondary | ICD-10-CM

## 2016-12-07 DIAGNOSIS — M5030 Other cervical disc degeneration, unspecified cervical region: Secondary | ICD-10-CM | POA: Diagnosis not present

## 2016-12-07 DIAGNOSIS — C7951 Secondary malignant neoplasm of bone: Secondary | ICD-10-CM

## 2016-12-07 DIAGNOSIS — I824Z2 Acute embolism and thrombosis of unspecified deep veins of left distal lower extremity: Secondary | ICD-10-CM

## 2016-12-07 DIAGNOSIS — J449 Chronic obstructive pulmonary disease, unspecified: Secondary | ICD-10-CM

## 2016-12-07 DIAGNOSIS — Z87440 Personal history of urinary (tract) infections: Secondary | ICD-10-CM

## 2016-12-07 DIAGNOSIS — M797 Fibromyalgia: Secondary | ICD-10-CM

## 2016-12-07 DIAGNOSIS — Z7901 Long term (current) use of anticoagulants: Secondary | ICD-10-CM

## 2016-12-07 DIAGNOSIS — Z9221 Personal history of antineoplastic chemotherapy: Secondary | ICD-10-CM

## 2016-12-07 DIAGNOSIS — B1920 Unspecified viral hepatitis C without hepatic coma: Secondary | ICD-10-CM

## 2016-12-07 DIAGNOSIS — Z923 Personal history of irradiation: Secondary | ICD-10-CM

## 2016-12-07 DIAGNOSIS — K589 Irritable bowel syndrome without diarrhea: Secondary | ICD-10-CM

## 2016-12-07 DIAGNOSIS — C786 Secondary malignant neoplasm of retroperitoneum and peritoneum: Secondary | ICD-10-CM

## 2016-12-07 DIAGNOSIS — R1031 Right lower quadrant pain: Secondary | ICD-10-CM

## 2016-12-07 DIAGNOSIS — K219 Gastro-esophageal reflux disease without esophagitis: Secondary | ICD-10-CM

## 2016-12-07 DIAGNOSIS — Z86010 Personal history of colonic polyps: Secondary | ICD-10-CM

## 2016-12-07 LAB — CBC WITH DIFFERENTIAL/PLATELET
Basophils Absolute: 0.1 10*3/uL (ref 0–0.1)
Basophils Relative: 1 %
Eosinophils Absolute: 0.2 10*3/uL (ref 0–0.7)
Eosinophils Relative: 2 %
HCT: 27.5 % — ABNORMAL LOW (ref 35.0–47.0)
Hemoglobin: 9.4 g/dL — ABNORMAL LOW (ref 12.0–16.0)
Lymphocytes Relative: 10 %
Lymphs Abs: 1.3 10*3/uL (ref 1.0–3.6)
MCH: 31.2 pg (ref 26.0–34.0)
MCHC: 34.1 g/dL (ref 32.0–36.0)
MCV: 91.6 fL (ref 80.0–100.0)
Monocytes Absolute: 0.6 10*3/uL (ref 0.2–0.9)
Monocytes Relative: 5 %
Neutro Abs: 10.1 10*3/uL — ABNORMAL HIGH (ref 1.4–6.5)
Neutrophils Relative %: 82 %
Platelets: 267 10*3/uL (ref 150–440)
RBC: 3 MIL/uL — ABNORMAL LOW (ref 3.80–5.20)
RDW: 17.4 % — ABNORMAL HIGH (ref 11.5–14.5)
WBC: 12.2 10*3/uL — ABNORMAL HIGH (ref 3.6–11.0)

## 2016-12-07 LAB — BASIC METABOLIC PANEL
Anion gap: 8 (ref 5–15)
BUN: 17 mg/dL (ref 6–20)
CO2: 22 mmol/L (ref 22–32)
Calcium: 9.1 mg/dL (ref 8.9–10.3)
Chloride: 105 mmol/L (ref 101–111)
Creatinine, Ser: 1.18 mg/dL — ABNORMAL HIGH (ref 0.44–1.00)
GFR calc Af Amer: 56 mL/min — ABNORMAL LOW (ref >60)
GFR calc non Af Amer: 48 mL/min — ABNORMAL LOW (ref >60)
Glucose, Bld: 102 mg/dL — ABNORMAL HIGH (ref 65–99)
Potassium: 3.8 mmol/L (ref 3.5–5.1)
Sodium: 135 mmol/L (ref 135–145)

## 2016-12-07 NOTE — Progress Notes (Addendum)
[facility] day:  12/07/2016  Chief Complaint: Sandra Landry is a 66 y.o. female with metastatic right ureteral cancer who is seen for 2 week assessment after completion of palliative radiation.  HPI: The patient was last seen in the medical oncology clinic on 11/21/2016.  At that time, her pain was controlled.  Weight was stable. Anemia was improved.  Recent CT scan from 10/25/2016 was reviewed.  There was increased osseous distraction of the adjacent right S1 vertebra which was in the field of radiation. We discussed Tecentriq.  Response rates and potential side effects were reviewed.  Quality of life issues was discussed.  She was undecided.  She was interested in having her nephrostomy tube removed.  Symptomatically, she notes the pain at night in her legs and thighs.  She notes discomfort in her right side.  She is on Fentanyl 75 g/hour.  Her nephrostomy tube has been capped with plan to remove it on 12/10/2016.  Appetite remains "not great".  Radiation is complete. She has an appointment with Dr. Baruch Gouty on 12/31/2016.   Past Medical History:  Diagnosis Date  . Anxiety   . Arthritis   . Asthma    mild  . Cervical disc disease   . COPD (chronic obstructive pulmonary disease) (HCC)    mild  . Depression   . Dysrhythmia    tachycardia  . Fibromyalgia   . GERD (gastroesophageal reflux disease)   . Hepatitis C     Past Surgical History:  Procedure Laterality Date  . cervical discectomy with fusion    . COLONOSCOPY  01/2016  . EUS N/A 06/21/2016   Procedure: LOWER ENDOSCOPIC ULTRASOUND (EUS);  Surgeon: Reita Cliche, MD;  Location: [facility] ENDOSCOPY;  Service: Endoscopy;  Laterality: N/A;    No family history on file.  Social History:  reports that she has never smoked. She has never used smokeless tobacco. She reports that she uses drugs, including Marijuana. She reports that she does not drink alcohol.  She lives in [place].  She is  a former truck and Recruitment consultant.  She has also worked in the Physicist, medical.  Her partner is Darliss Ridgel, a Marine scientist.  The patient lives in [place].  Allergies:  Allergies  Allergen Reactions  . Other Swelling    Dust, ragweed  . Iodides   . Tetanus Toxoids Other (See Comments)    Localized pain and swelling  . Iodine Other (See Comments) and Rash    Limited history. Occurred in 88s, at age 53-12, received contrast at public health facility in [place], while being evaluated for meningitis. Immediate rash - not itchy, "flat, red, perfect circles" over entire body. Has not received iodine contrast since.    Current Medications: Current Outpatient Prescriptions  Medication Sig Dispense Refill  . baclofen (LIORESAL) 20 MG tablet Take 20 mg by mouth 4 (four) times daily as needed for muscle spasms.     . cetirizine (ZYRTEC) 10 MG tablet Take 10 mg by mouth daily.     Marland Kitchen diltiazem (CARDIZEM CD) 120 MG 24 hr capsule Take 120 mg by mouth daily.     . diphenhydrAMINE (BENADRYL) 25 MG tablet Take 25-50 mg by mouth every 6 (six) hours as needed.    Marland Kitchen escitalopram (LEXAPRO) 20 MG tablet Take 20 mg by mouth daily.     Marland Kitchen ibuprofen (ADVIL,MOTRIN) 200 MG tablet Take 200-600 mg by mouth every 6 (six) hours as needed for mild pain or  moderate pain.    Marland Kitchen lactulose (CHRONULAC) 10 GM/15ML solution     . oxyCODONE-acetaminophen (PERCOCET/ROXICET) 5-325 MG tablet     . polyethylene glycol (MIRALAX / GLYCOLAX) packet Take 17 g by mouth daily as needed.     . prochlorperazine (COMPAZINE) 5 MG/ML injection Inject 5-10 mg into the vein as directed. Every 4 to 6 hours as needed.    . promethazine (PHENERGAN) 25 MG suppository Place 25 mg rectally as directed. Every 4 to 6 hours as needed    . ranitidine (ZANTAC) 300 MG tablet Take 300 mg by mouth daily.     . traMADol (ULTRAM) 50 MG tablet Take 50-100 mg by mouth every 4 (four) hours as needed.     . potassium chloride (K-DUR) 10 MEQ tablet Take 2 tablets (20 mEq  total) by mouth daily. 28 tablet 0  . potassium chloride (MICRO-K) 10 MEQ CR capsule      No current facility-administered medications for this visit.     Review of Systems:  GENERAL:  Fatigue.  No fever or sweats.  Weight stable. PERFORMANCE STATUS (ECOG):  2 HEENT:  Decreased vision in left eye since fall (no change).  Photophobia.  No runny nose, sore throat, mouth sores or tenderness. Lungs:  No shortness of breath or cough.  No hemoptysis.  Singultus. Cardiac:  No chest pain, palpitations, orthopnea, or PND. GI:  Eating better. No diarrhea, constipationmelena or hematochezia. GU:  Right urostomy tube (still wants out).  Exit site clean.  No urgency, frequency, or dysuria. Musculoskeletal:  Right sided back pain.  No joint pain.  No muscle tenderness. Extremities:  No pain or swelling. Skin:  No rashes or skin changes. Neuro:  General weakness.  No focal numbness or weakness, or coordination issues. Endocrine:  No diabetes, thyroid issues, hot flashes or night sweats. Psych:  Hypersensitive.  PTSD.  Depression.  Anxiety. Pain:  Sacral pain (see HPI). Review of systems:  All other systems reviewed and found to be negative.  Physical Exam: BP 112/75 (BP Location: Left Arm, Patient Position: Sitting)   Pulse 79   Temp 95.1 F (35.1 C) (Tympanic)   Resp 18   Wt 134 lb 2 oz (60.8 kg)   BMI 21.65 kg/m  GENERAL:  Thin, chronically fatigued appearing woman lying on her side in the exam room in no acute distress.   MENTAL STATUS:  Alert and oriented to person, place and time. HEAD:  Short brown hair.  Normocephalic, atraumatic, face symmetric, no Cushingoid features. EYES:  Wearing dark sunglasses.  Pupils equal round and reactive to light and accomodation.  No conjunctivitis or scleral icterus. ENT:  Oropharynx clear without lesion.  No upper teeth.  Tongue normal. Mucous membranes moist.  RESPIRATORY:  Clear to auscultation without rales, wheezes or rhonchi. CARDIOVASCULAR:   Regular rate and rhythm without murmur, rub or gallop. ABDOMEN:  Soft, nontender with active bowel sounds and no hepatosplenomegaly.   BACK:  Right sided urostomy with dressing in place, capped. SKIN:  No rashes, ulcers or lesions. EXTREMITIES: No skin discoloration or tenderness.  No palpable cords. LYMPH NODES: No palpable cervical, supraclavicular, axillary or inguinal adenopathy  NEUROLOGICAL:  Appropriate. PSYCH:  Appropriate.   No visits with results within 3 Day(s) from this visit.  Latest known visit with results is:  Appointment on 11/21/2016  Component Date Value Ref Range Status  . Sodium 11/21/2016 138  135 - 145 mmol/L Final  . Potassium 11/21/2016 4.1  3.5 - 5.1 mmol/L Final  .  Chloride 11/21/2016 106  101 - 111 mmol/L Final  . CO2 11/21/2016 22  22 - 32 mmol/L Final  . Glucose, Bld 11/21/2016 99  65 - 99 mg/dL Final  . BUN 11/21/2016 13  6 - 20 mg/dL Final  . Creatinine, Ser 11/21/2016 1.38* 0.44 - 1.00 mg/dL Final  . Calcium 11/21/2016 9.5  8.9 - 10.3 mg/dL Final  . Total Protein 11/21/2016 7.0  6.5 - 8.1 g/dL Final  . Albumin 11/21/2016 3.6  3.5 - 5.0 g/dL Final  . AST 11/21/2016 15  15 - 41 U/L Final  . ALT 11/21/2016 7* 14 - 54 U/L Final  . Alkaline Phosphatase 11/21/2016 57  38 - 126 U/L Final  . Total Bilirubin 11/21/2016 0.2* 0.3 - 1.2 mg/dL Final  . GFR calc non Af Amer 11/21/2016 40* >60 mL/min Final  . GFR calc Af Amer 11/21/2016 46* >60 mL/min Final   Comment: (NOTE) The eGFR has been calculated using the CKD EPI equation. This calculation has not been validated in all clinical situations. eGFR's persistently <60 mL/min signify possible Chronic Kidney Disease.   . Anion gap 11/21/2016 10  5 - 15 Final  . WBC 11/21/2016 6.0  3.6 - 11.0 K/uL Final  . RBC 11/21/2016 3.12* 3.80 - 5.20 MIL/uL Final  . Hemoglobin 11/21/2016 9.7* 12.0 - 16.0 g/dL Final  . HCT 11/21/2016 28.9* 35.0 - 47.0 % Final  . MCV 11/21/2016 92.5  80.0 - 100.0 fL Final  . MCH  11/21/2016 31.0  26.0 - 34.0 pg Final  . MCHC 11/21/2016 33.6  32.0 - 36.0 g/dL Final  . RDW 11/21/2016 19.4* 11.5 - 14.5 % Final  . Platelets 11/21/2016 250  150 - 440 K/uL Final  . Neutrophils Relative % 11/21/2016 57  % Final  . Neutro Abs 11/21/2016 3.5  1.4 - 6.5 K/uL Final  . Lymphocytes Relative 11/21/2016 30  % Final  . Lymphs Abs 11/21/2016 1.8  1.0 - 3.6 K/uL Final  . Monocytes Relative 11/21/2016 6  % Final  . Monocytes Absolute 11/21/2016 0.3  0.2 - 0.9 K/uL Final  . Eosinophils Relative 11/21/2016 6  % Final  . Eosinophils Absolute 11/21/2016 0.4  0 - 0.7 K/uL Final  . Basophils Relative 11/21/2016 1  % Final  . Basophils Absolute 11/21/2016 0.1  0 - 0.1 K/uL Final  . Blood Bank Specimen 11/21/2016 SAMPLE AVAILABLE FOR TESTING   Final  . Sample Expiration 11/21/2016 11/24/2016   Final    No visits with results within 3 Day(s) from this visit.  Latest known visit with results is:  Admission on 06/21/2016, Discharged on 06/21/2016  Component Date Value Ref Range Status  . SURGICAL PATHOLOGY 06/27/2016    Final                   Value:Surgical Pathology CASE: [id number] PATIENT: Meklit Maese Surgical Pathology Report     SPECIMEN SUBMITTED: A. Recto-sigmoid mass; cbx  CLINICAL HISTORY: None provided  PRE-OPERATIVE DIAGNOSIS: Right pelvic retroperitoneal mass.  Needs biopsy  POST-OPERATIVE DIAGNOSIS: Invasive peri-rectal mass     DIAGNOSIS: A. RECTOSIGMOID MASS; COLD BIOPSY: - UROTHELIAL CARCINOMA UNDERMINING COLONIC MUCOSA.  Comment:  A limited panel of immunohistochemical stains was performed. The neoplastic cells have the following immunoreactivity: Super pancytokeratin: Positive p40: Positive GATA-3: Positive SOX-1: Negative CD45: Negative Vimentin: Negative DOG-1: Negative PAX-8: Negative CDX-2: Negative p16: Negative (high background) These findings support the above diagnosis. These results were communicated to Dr. Mike Gip on  06/27/2016. Stain  controls worked appropriately.  GROSS DESCRIPTION: A. Labeled: rectosigmoid mass C BX Tissue fragment(s): multiple S                         ize: aggregate, 0.8 x 0.6 x 0.1 cm Description: tan fragments  Entirely submitted in 1 cassette(s).  Final Diagnosis performed by Quay Burow, MD.  Electronically signed 06/27/2016 4:00:48PM    The electronic signature indicates that the named Attending Pathologist has evaluated the specimen  Technical component performed at [facility], [address], [place], [place] [address] Lab: [phone] Dir: Darrick Penna. Evette Doffing, MD  Professional component performed at [facility], [facility], [place], [place], [place] [address] Lab: [phone] Dir: Dellia Nims. Reuel Derby, MD      Assessment:  Mariadel Mruk is a 66 y.o. female with metastatic right ureteral carcinoma s/p biopsy.  She has a history of lower abdomen for 2 years felt secondary to irritable bowel syndrome.  She has had RLQ pain which has extended to her right buttock and hip for 3 months.  She has lost 38 pounds in the past year.  Lower endoscopic ultrasound on 06/21/2016 revealed an infiltrative 5.6 x 4.5 cm non-obstructing large mass in the recto-sigmoid colon extendiing from 14 cm to 18 cm from the anal verge. The mass was non-circumferential.  Biopsies revealed a urothelial carcinoma.  Pathologic stage was T4NxM0.  Abdominal and pelvic CT scan on 05/14/2016 revealed a 5.7 cm right lower quadrant retroperitoneal mass highly concerning for malignancy. The mass involved the sigmoid colon but is favored to reflect a primary retroperitoneal tumor (such as sarcoma) over a primary colonic mass.  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  There were multiple subcentimeter liver lesions, indeterminate.   PET scan on 06/06/2016 revealed hypermetabolism (SUV 19.7)  corresponding to the right lower quadrant  retroperitoneal mass. It was felt intimately associated with the rectosigmoid colon, but originating outside of the colon. There was similar right-sided hydroureteronephrosis due to the right lower quadrant mass.  There was a 4 mm nodule in the left lower lobe (unclear significance).  Colonoscopy at [facility] on 07/18/2015 revealed 3 adenomatous polyps.  She underwent right nephrostomy tube placement on 07/11/2016  She has a normocytic anemia.  Labs on 07/26/2016 revealed a ferritin (284), iron saturation (4%), TIBC 179 (low) B12 (628), folate (7.1), and ESR (54).  She has had some blood from her nephrostomy tube.  She has anemia of chronic disease.  Diet is poor.  She received 1 unit PRBCs on 08/01/2016.  She has leukocytosis secondary to her underlying malignancy.  Work-up on 07/26/2016 revealed a negative urinalysis, blood cultures, and CXR.  Urinalysis revealed 20,000 colonies/ml Klebsiella pneumonia.  She received a course of ciprofloxacin.  Urine culture on 08/06/2016 revealed Stenotrophomonas maltophilia.  She was treated with a course of Levaquin.  Prior to treatment, she lost 35+ pounds.  She began Megace on 07/26/2016.  She has lost an additional 12 pounds with her recent admission to [facility].  She has received 2 cycles of cisplatin and gemcitabine (07/30/2016 - 09/04/2016).  Cisplatin was given as a split dose (day 1 and 2).  Course was complicated by an admission to [facility] on 08/08/2016 following a fall.  Head MRI revealed a subarachnoid hemorrhage.  She had atrial fibrillation with RVR.  Chest, abdomen, and pelvic CT scan on 08/08/2016 revealed a mild age-indeterminate compression fracture of T6. There was a 6 mm hypodensity  at the dome of the liver too small to characterize and a few additional small subcentimeter hypodense scattered lesions in the liver. There was a 7.3 x 6.4 x 7.7 cm right pelvic mass invading the sacrum and causing a lytic lesion with pathologic fracture.  The mass likely invaded  the right psoas muscle.  Left lower extremity duplex on 09/15/2016 revealed extensive left lower extremity DVT involving the entire visualized deep veins from the level of the common femoral vein to the calf veins.  An IVC filter was placed on 09/20/2015.  She is on Lovenox.  She completed palliative radiation to the right pelvis.  Pain is controlled with Fentanyl 75 mcg/hr patch,  Chest, abdomen, and pelvic CT on 10/25/2016 revealed mild decrease in size of right lower quadrant retroperitoneal and adnexal mass. Although mildly decreased in size, there was increased osseous destruction of the adjacent right S1 vertebra.  There was resolution of right hydronephrosis status post percutaneous nephrostomy tube placement. There was stable diffuse right renal atrophy.  There was no evidence of metastatic disease within the chest or abdomen  She has a normocytic anemia.  Work-up on 11/05/2016 revealed B12 and folate deficiency.  She is on oral X77 and folic acid.  Hematocrit was 23.6 with a hemoglobin of 8.0.  B12 was 201.  Folate was 5.8.  Creatinine was 1.65.  Ferritin was 474 with 17% iron sat and TIBC of 230.  ESR was 83.  Symptomatically, she has pain in her right hip (? referred).  Appetite is poor.  Plan: 1.  Labs today:  CBC with diff, BMP. 2.  Plain films right femur and hip today. 3.  Bone scan. 4.  Discuss patient's thoughts about Tecentriq. Response rates and potential side effects reviewed.  Patient considering. 5.  Anticipate nephrostomy tube removal on 12/10/2016 for quality of life. 6.  RTC next week to review films and plan upcoming treatment   Lequita Asal, MD 12/07/2016

## 2016-12-07 NOTE — Telephone Encounter (Signed)
Called and spoke with Mickle Asper to inform her that x-ray of patient's leg is normal.

## 2016-12-07 NOTE — Progress Notes (Signed)
Nephrostomy tube scheduled to come out on Monday. Patient will have x-ray of right  leg today after her appointment.

## 2016-12-07 NOTE — Telephone Encounter (Signed)
-----   Message from Lequita Asal, MD sent at 12/07/2016  1:38 PM EDT ----- Regarding: Please call patient  Plain films normal.  M  ----- Message ----- From: Interface, Rad Results In Sent: 12/07/2016   1:22 PM To: Lequita Asal, MD

## 2016-12-10 ENCOUNTER — Ambulatory Visit: Payer: 59

## 2016-12-10 DIAGNOSIS — N1330 Unspecified hydronephrosis: Secondary | ICD-10-CM

## 2016-12-10 NOTE — Progress Notes (Signed)
Pt presented today for nephrostomy tube removal. Pt tolerated well. No s/s of adverse reaction noted.

## 2016-12-12 ENCOUNTER — Ambulatory Visit: Payer: 59

## 2016-12-12 ENCOUNTER — Telehealth: Payer: Self-pay | Admitting: *Deleted

## 2016-12-12 NOTE — Telephone Encounter (Signed)
Advised Patty to take her to ER either by car or EMS. She states she will do that and then said she will be running out of her pain meds before they are due and wants to be sure she does not go without it next week when Dr C is on vacation

## 2016-12-12 NOTE — Telephone Encounter (Signed)
  We will address tomorrow when we are in [place].

## 2016-12-12 NOTE — Telephone Encounter (Signed)
  Patient to go to ER.  They will give pain medications and do imaging.  If she needs to be admitted, she will be admitted from ER.  M

## 2016-12-12 NOTE — Telephone Encounter (Signed)
Called Sandra Landry pts partner and advised her to go to the ED for pain management and possible admission, voiced understanding.

## 2016-12-12 NOTE — Telephone Encounter (Signed)
Having increased pain to the point that she cannot move. She is unable to get to scan today. Asking if she can get it done inpatient. Reports that she is taking more Percocet's than is prescribed and also ativan and is still in pain. Very concerned, states the only way she would be able to get her to scan would be medical transport. Asking what she is to do.Please advise

## 2016-12-13 ENCOUNTER — Inpatient Hospital Stay (HOSPITAL_BASED_OUTPATIENT_CLINIC_OR_DEPARTMENT_OTHER): Payer: 59 | Admitting: Hematology and Oncology

## 2016-12-13 ENCOUNTER — Encounter: Payer: Self-pay | Admitting: Emergency Medicine

## 2016-12-13 ENCOUNTER — Emergency Department
Admission: EM | Admit: 2016-12-13 | Discharge: 2016-12-13 | Disposition: A | Discharge status: Home / Self Care | Payer: 59 | Attending: Emergency Medicine | Admitting: Emergency Medicine

## 2016-12-13 VITALS — BP 101/63 | HR 84 | Temp 96.2°F | Resp 18 | Wt 124.0 lb

## 2016-12-13 DIAGNOSIS — F329 Major depressive disorder, single episode, unspecified: Secondary | ICD-10-CM

## 2016-12-13 DIAGNOSIS — R000 Tachycardia, unspecified: Secondary | ICD-10-CM

## 2016-12-13 DIAGNOSIS — F419 Anxiety disorder, unspecified: Secondary | ICD-10-CM | POA: Diagnosis not present

## 2016-12-13 DIAGNOSIS — Z7901 Long term (current) use of anticoagulants: Secondary | ICD-10-CM

## 2016-12-13 DIAGNOSIS — M5030 Other cervical disc degeneration, unspecified cervical region: Secondary | ICD-10-CM

## 2016-12-13 DIAGNOSIS — Z79899 Other long term (current) drug therapy: Secondary | ICD-10-CM | POA: Insufficient documentation

## 2016-12-13 DIAGNOSIS — M79651 Pain in right thigh: Secondary | ICD-10-CM | POA: Insufficient documentation

## 2016-12-13 DIAGNOSIS — R1031 Right lower quadrant pain: Secondary | ICD-10-CM

## 2016-12-13 DIAGNOSIS — Z8554 Personal history of malignant neoplasm of ureter: Secondary | ICD-10-CM | POA: Insufficient documentation

## 2016-12-13 DIAGNOSIS — C786 Secondary malignant neoplasm of retroperitoneum and peritoneum: Secondary | ICD-10-CM

## 2016-12-13 DIAGNOSIS — I4891 Unspecified atrial fibrillation: Secondary | ICD-10-CM

## 2016-12-13 DIAGNOSIS — D72829 Elevated white blood cell count, unspecified: Secondary | ICD-10-CM

## 2016-12-13 DIAGNOSIS — J45909 Unspecified asthma, uncomplicated: Secondary | ICD-10-CM | POA: Diagnosis not present

## 2016-12-13 DIAGNOSIS — R634 Abnormal weight loss: Secondary | ICD-10-CM

## 2016-12-13 DIAGNOSIS — I10 Essential (primary) hypertension: Secondary | ICD-10-CM | POA: Diagnosis not present

## 2016-12-13 DIAGNOSIS — N261 Atrophy of kidney (terminal): Secondary | ICD-10-CM

## 2016-12-13 DIAGNOSIS — Z87440 Personal history of urinary (tract) infections: Secondary | ICD-10-CM

## 2016-12-13 DIAGNOSIS — C7951 Secondary malignant neoplasm of bone: Secondary | ICD-10-CM | POA: Diagnosis not present

## 2016-12-13 DIAGNOSIS — Z9221 Personal history of antineoplastic chemotherapy: Secondary | ICD-10-CM

## 2016-12-13 DIAGNOSIS — J449 Chronic obstructive pulmonary disease, unspecified: Secondary | ICD-10-CM | POA: Diagnosis not present

## 2016-12-13 DIAGNOSIS — Z86010 Personal history of colonic polyps: Secondary | ICD-10-CM

## 2016-12-13 DIAGNOSIS — M898X5 Other specified disorders of bone, thigh: Secondary | ICD-10-CM

## 2016-12-13 DIAGNOSIS — B1920 Unspecified viral hepatitis C without hepatic coma: Secondary | ICD-10-CM

## 2016-12-13 DIAGNOSIS — K589 Irritable bowel syndrome without diarrhea: Secondary | ICD-10-CM

## 2016-12-13 DIAGNOSIS — M797 Fibromyalgia: Secondary | ICD-10-CM

## 2016-12-13 DIAGNOSIS — I82412 Acute embolism and thrombosis of left femoral vein: Secondary | ICD-10-CM

## 2016-12-13 DIAGNOSIS — M129 Arthropathy, unspecified: Secondary | ICD-10-CM

## 2016-12-13 DIAGNOSIS — Z923 Personal history of irradiation: Secondary | ICD-10-CM

## 2016-12-13 DIAGNOSIS — M25561 Pain in right knee: Secondary | ICD-10-CM | POA: Insufficient documentation

## 2016-12-13 DIAGNOSIS — Z86718 Personal history of other venous thrombosis and embolism: Secondary | ICD-10-CM

## 2016-12-13 DIAGNOSIS — C661 Malignant neoplasm of right ureter: Secondary | ICD-10-CM | POA: Diagnosis not present

## 2016-12-13 DIAGNOSIS — K219 Gastro-esophageal reflux disease without esophagitis: Secondary | ICD-10-CM

## 2016-12-13 DIAGNOSIS — G893 Neoplasm related pain (acute) (chronic): Secondary | ICD-10-CM

## 2016-12-13 MED ORDER — OXYCODONE-ACETAMINOPHEN 7.5-325 MG PO TABS: 1.0000 | ORAL_TABLET | ORAL | 0 refills | Status: DC | PRN

## 2016-12-13 MED ORDER — FENTANYL 100 MCG/HR TD PT72: 100.0000 ug | MEDICATED_PATCH | TRANSDERMAL | 0 refills | Status: DC

## 2016-12-13 MED ORDER — HYDROMORPHONE HCL 1 MG/ML IJ SOLN
1.0000 mg | Freq: Once | INTRAMUSCULAR | Status: AC
  Administered 2016-12-13: 1 mg via INTRAMUSCULAR
  Filled 2016-12-13: qty 1

## 2016-12-13 NOTE — Progress Notes (Signed)
[facility] day:  12/13/2016   Chief Complaint: Sandra Landry is a 66 y.o. female with metastatic right ureteral cancer who is seen for 1 week assessment.  HPI: The patient was last seen in the medical oncology clinic on 12/07/2016.  At that time, she noted right leg pain.  Pain was fairly well controlled.  Right femur films on 12/07/2016 revealed no acute fracture, subluxation, periosteal reaction or bone erosion.  She was scheduled to have a bone scan yesterday.  Because of severe pain, she did not have her scan performed yesterday.  She was seen in the emergency room today.  She received Dilaudid with good relief of pain.  Her nephrostomy tube was removed on 12/10/2016.  Pain is in right hip and femur and extends to knee.  She has had poor pain relief with Percocet 5/325 around the clock.  She has a Fentanyl 75 mcg/hr patch.  Oral intake has been poor.   Past Medical History:  Diagnosis Date  . Anxiety   . Arthritis   . Asthma    mild  . Cervical disc disease   . COPD (chronic obstructive pulmonary disease) (HCC)    mild  . Depression   . Dysrhythmia    tachycardia  . Fibromyalgia   . GERD (gastroesophageal reflux disease)   . Hepatitis C     Past Surgical History:  Procedure Laterality Date  . cervical discectomy with fusion    . COLONOSCOPY  01/2016  . EUS N/A 06/21/2016   Procedure: LOWER ENDOSCOPIC ULTRASOUND (EUS);  Surgeon: Reita Cliche, MD;  Location: [facility] ENDOSCOPY;  Service: Endoscopy;  Laterality: N/A;    No family history on file.  Social History:  reports that she has never smoked. She has never used smokeless tobacco. She reports that she uses drugs, including Marijuana. She reports that she does not drink alcohol.  She lives in [place].  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  She has also worked in the Physicist, medical.  Her partner is Darliss Ridgel, a Marine scientist.  The patient lives in [place].  She is accompanied by  Patty today.  Allergies:  Allergies  Allergen Reactions  . Other Swelling    Dust, ragweed  . Iodides   . Tetanus Toxoids Other (See Comments)    Localized pain and swelling  . Iodine Other (See Comments) and Rash    Limited history. Occurred in 32s, at age 11-12, received contrast at public health facility in [place], while being evaluated for meningitis. Immediate rash - not itchy, "flat, red, perfect circles" over entire body. Has not received iodine contrast since.    Current Medications: Current Outpatient Prescriptions  Medication Sig Dispense Refill  . baclofen (LIORESAL) 20 MG tablet Take 20 mg by mouth 4 (four) times daily as needed for muscle spasms.     . cetirizine (ZYRTEC) 10 MG tablet Take 10 mg by mouth daily.     Marland Kitchen diltiazem (CARDIZEM CD) 120 MG 24 hr capsule Take 120 mg by mouth daily.     . diphenhydrAMINE (BENADRYL) 25 MG tablet Take 25-50 mg by mouth every 6 (six) hours as needed.    Marland Kitchen escitalopram (LEXAPRO) 20 MG tablet Take 20 mg by mouth daily.     Marland Kitchen ibuprofen (ADVIL,MOTRIN) 200 MG tablet Take 200-600 mg by mouth every 6 (six) hours as needed for mild pain or moderate pain.    Marland Kitchen lactulose (CHRONULAC) 10 GM/15ML solution     .  oxyCODONE-acetaminophen (PERCOCET/ROXICET) 5-325 MG tablet     . polyethylene glycol (MIRALAX / GLYCOLAX) packet Take 17 g by mouth daily as needed.     . prochlorperazine (COMPAZINE) 5 MG/ML injection Inject 5-10 mg into the vein as directed. Every 4 to 6 hours as needed.    . promethazine (PHENERGAN) 25 MG suppository Place 25 mg rectally as directed. Every 4 to 6 hours as needed    . ranitidine (ZANTAC) 300 MG tablet Take 300 mg by mouth daily.     . traMADol (ULTRAM) 50 MG tablet Take 50-100 mg by mouth every 4 (four) hours as needed.     . potassium chloride (K-DUR) 10 MEQ tablet Take 2 tablets (20 mEq total) by mouth daily. 28 tablet 0  . potassium chloride (MICRO-K) 10 MEQ CR capsule      No current facility-administered  medications for this visit.     Review of Systems:  GENERAL:  Fatigue.  No fever or sweats.  Weight down 10 pounds. PERFORMANCE STATUS (ECOG):  2 HEENT:  Decreased vision in left eye (chronic s/p fall).  Photophobia.  No runny nose, sore throat, mouth sores or tenderness. Lungs:  No shortness of breath or cough.  No hemoptysis.  Singultus. Cardiac:  No chest pain, palpitations, orthopnea, or PND. GI:  Poor oral intake. No diarrhea, constipationmelena or hematochezia. GU:  Right urostomy tube s/p removal on 12/10/2016.  No urgency, frequency, or dysuria. Musculoskeletal:  Right sided back pain and hip pain.  Pain most severe in right upper leg.  No muscle tenderness. Extremities:  No swelling.  Right leg pain. Skin:  No rashes or skin changes. Neuro:  General weakness.  No focal numbness or weakness, or coordination issues. Endocrine:  No diabetes, thyroid issues, hot flashes or night sweats. Psych:  Hypersensitive.  PTSD.  Depression.  Anxiety. Pain:  Right leg pain (see HPI). Review of systems:  All other systems reviewed and found to be negative.  Physical Exam: BP 101/63 (BP Location: Right Arm, Patient Position: Sitting)   Pulse 84   Temp (!) 96.2 F (35.7 C) (Tympanic)   Resp 18   Wt 124 lb (56.2 kg)   BMI 20.01 kg/m  GENERAL:  Thin, chronically fatigued appearing woman somewhat uncomfortable  lying in the exam room. MENTAL STATUS:  Alert and oriented to person, place and time. HEAD:  Short brown hair.  Normocephalic, atraumatic, face symmetric, no Cushingoid features. EYES:  Wearing dark sunglasses.  Pupils equal round and reactive to light and accomodation.  No conjunctivitis or scleral icterus. ENT:  Oropharynx clear without lesion.  No upper teeth.  Tongue normal. Mucous membranes moist.  RESPIRATORY:  Clear to auscultation without rales, wheezes or rhonchi. CARDIOVASCULAR:  Regular rate and rhythm without murmur, rub or gallop. ABDOMEN:  Soft, nontender with active bowel  sounds and no hepatosplenomegaly.   SKIN:  No rashes, ulcers or lesions. EXTREMITIES: Right upper leg sensitive to touch.  No skin discoloration or edema.  No palpable cords. LYMPH NODES: No palpable cervical, supraclavicular, axillary or inguinal adenopathy  NEUROLOGICAL:  Appropriate. PSYCH:  Appropriate.   No visits with results within 3 Day(s) from this visit.  Latest known visit with results is:  Appointment on 12/07/2016  Component Date Value Ref Range Status  . WBC 12/07/2016 12.2* 3.6 - 11.0 K/uL Final  . RBC 12/07/2016 3.00* 3.80 - 5.20 MIL/uL Final  . Hemoglobin 12/07/2016 9.4* 12.0 - 16.0 g/dL Final  . HCT 12/07/2016 27.5* 35.0 - 47.0 %  Final  . MCV 12/07/2016 91.6  80.0 - 100.0 fL Final  . MCH 12/07/2016 31.2  26.0 - 34.0 pg Final  . MCHC 12/07/2016 34.1  32.0 - 36.0 g/dL Final  . RDW 12/07/2016 17.4* 11.5 - 14.5 % Final  . Platelets 12/07/2016 267  150 - 440 K/uL Final  . Neutrophils Relative % 12/07/2016 82  % Final  . Neutro Abs 12/07/2016 10.1* 1.4 - 6.5 K/uL Final  . Lymphocytes Relative 12/07/2016 10  % Final  . Lymphs Abs 12/07/2016 1.3  1.0 - 3.6 K/uL Final  . Monocytes Relative 12/07/2016 5  % Final  . Monocytes Absolute 12/07/2016 0.6  0.2 - 0.9 K/uL Final  . Eosinophils Relative 12/07/2016 2  % Final  . Eosinophils Absolute 12/07/2016 0.2  0 - 0.7 K/uL Final  . Basophils Relative 12/07/2016 1  % Final  . Basophils Absolute 12/07/2016 0.1  0 - 0.1 K/uL Final  . Sodium 12/07/2016 135  135 - 145 mmol/L Final  . Potassium 12/07/2016 3.8  3.5 - 5.1 mmol/L Final  . Chloride 12/07/2016 105  101 - 111 mmol/L Final  . CO2 12/07/2016 22  22 - 32 mmol/L Final  . Glucose, Bld 12/07/2016 102* 65 - 99 mg/dL Final  . BUN 12/07/2016 17  6 - 20 mg/dL Final  . Creatinine, Ser 12/07/2016 1.18* 0.44 - 1.00 mg/dL Final  . Calcium 12/07/2016 9.1  8.9 - 10.3 mg/dL Final  . GFR calc non Af Amer 12/07/2016 48* >60 mL/min Final  . GFR calc Af Amer 12/07/2016 56* >60 mL/min Final    Comment: (NOTE) The eGFR has been calculated using the CKD EPI equation. This calculation has not been validated in all clinical situations. eGFR's persistently <60 mL/min signify possible Chronic Kidney Disease.   . Anion gap 12/07/2016 8  5 - 15 Final    No visits with results within 3 Day(s) from this visit.  Latest known visit with results is:  Admission on 06/21/2016, Discharged on 06/21/2016  Component Date Value Ref Range Status  . SURGICAL PATHOLOGY 06/27/2016    Final                   Value:Surgical Pathology CASE: [id number] PATIENT: Aarika Dominey Surgical Pathology Report     SPECIMEN SUBMITTED: A. Recto-sigmoid mass; cbx  CLINICAL HISTORY: None provided  PRE-OPERATIVE DIAGNOSIS: Right pelvic retroperitoneal mass.  Needs biopsy  POST-OPERATIVE DIAGNOSIS: Invasive peri-rectal mass     DIAGNOSIS: A. RECTOSIGMOID MASS; COLD BIOPSY: - UROTHELIAL CARCINOMA UNDERMINING COLONIC MUCOSA.  Comment:  A limited panel of immunohistochemical stains was performed. The neoplastic cells have the following immunoreactivity: Super pancytokeratin: Positive p40: Positive GATA-3: Positive SOX-1: Negative CD45: Negative Vimentin: Negative DOG-1: Negative PAX-8: Negative CDX-2: Negative p16: Negative (high background) These findings support the above diagnosis. These results were communicated to Dr. Mike Gip on 06/27/2016. Stain controls worked appropriately.  GROSS DESCRIPTION: A. Labeled: rectosigmoid mass C BX Tissue fragment(s): multiple S                         ize: aggregate, 0.8 x 0.6 x 0.1 cm Description: tan fragments  Entirely submitted in 1 cassette(s).  Final Diagnosis performed by Quay Burow, MD.  Electronically signed 06/27/2016 4:00:48PM    The electronic signature indicates that the named Attending Pathologist has evaluated the specimen  Technical component performed at [facility], [address], [place], [place] [address] Lab:  [phone] Dir: Darrick Penna. Evette Doffing, MD  Professional  component performed at [facility], [facility], [place], [place], [place] [address] Lab: [phone] Dir: Dellia Nims. Reuel Derby, MD      Assessment:  Thais Silberstein is a 66 y.o. female with metastatic right ureteral carcinoma s/p biopsy.  She has a history of lower abdomen for 2 years felt secondary to irritable bowel syndrome.  She has had RLQ pain which has extended to her right buttock and hip for 3 months.  She has lost 38 pounds in the past year.  Lower endoscopic ultrasound on 06/21/2016 revealed an infiltrative 5.6 x 4.5 cm non-obstructing large mass in the recto-sigmoid colon extendiing from 14 cm to 18 cm from the anal verge. The mass was non-circumferential.  Biopsies revealed a urothelial carcinoma.  Pathologic stage was T4NxM0.  Abdominal and pelvic CT scan on 05/14/2016 revealed a 5.7 cm right lower quadrant retroperitoneal mass highly concerning for malignancy. The mass involved the sigmoid colon but is favored to reflect a primary retroperitoneal tumor (such as sarcoma) over a primary colonic mass.  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  There were multiple subcentimeter liver lesions, indeterminate.   PET scan on 06/06/2016 revealed hypermetabolism (SUV 19.7)  corresponding to the right lower quadrant retroperitoneal mass. It was felt intimately associated with the rectosigmoid colon, but originating outside of the colon. There was similar right-sided hydroureteronephrosis due to the right lower quadrant mass.  There was a 4 mm nodule in the left lower lobe (unclear significance).  Colonoscopy at [facility] on 07/18/2015 revealed 3 adenomatous polyps.  She underwent right nephrostomy tube placement on 07/11/2016  She has a normocytic anemia.  Labs on 07/26/2016 revealed a ferritin (284), iron saturation (4%), TIBC 179 (low) B12 (628), folate (7.1), and  ESR (54).  She has had some blood from her nephrostomy tube.  She has anemia of chronic disease.  Diet is poor.  She received 1 unit PRBCs on 08/01/2016.  She has leukocytosis secondary to her underlying malignancy.  Work-up on 07/26/2016 revealed a negative urinalysis, blood cultures, and CXR.  Urinalysis revealed 20,000 colonies/ml Klebsiella pneumonia.  She received a course of ciprofloxacin.  Urine culture on 08/06/2016 revealed Stenotrophomonas maltophilia.  She was treated with a course of Levaquin.  Prior to treatment, she lost 35+ pounds.  She began Megace on 07/26/2016.  She has lost an additional 12 pounds with her recent admission to [facility].  She has received 2 cycles of cisplatin and gemcitabine (07/30/2016 - 09/04/2016).  Cisplatin was given as a split dose (day 1 and 2).  Course was complicated by an admission to [facility] on 08/08/2016 following a fall.  Head MRI revealed a subarachnoid hemorrhage.  She had atrial fibrillation with RVR.  Chest, abdomen, and pelvic CT scan on 08/08/2016 revealed a mild age-indeterminate compression fracture of T6. There was a 6 mm hypodensity at the dome of the liver too small to characterize and a few additional small subcentimeter hypodense scattered lesions in the liver. There was a 7.3 x 6.4 x 7.7 cm right pelvic mass invading the sacrum and causing a lytic lesion with pathologic fracture.  The mass likely invaded the right psoas muscle.  Left lower extremity duplex on 09/15/2016 revealed extensive left lower extremity DVT involving the entire visualized deep veins from the level of the common femoral vein to the calf veins.  An IVC filter was placed on 09/20/2015.  She is on Lovenox.  She is completing palliative radiation (1-2 fractions remaining).  Pain is controlled with Fentanyl 50 mcg/hr patch,  Chest, abdomen, and pelvic CT on 10/25/2016 revealed mild decrease in size of right lower quadrant retroperitoneal and adnexal mass. Although mildly decreased in  size, there was increased osseous destruction of the adjacent right S1 vertebra.  There was resolution of right hydronephrosis status post percutaneous nephrostomy tube placement. There was stable diffuse right renal atrophy.  There was no evidence of metastatic disease within the chest or abdomen  She has a normocytic anemia.  Work-up on 11/05/2016 revealed B12 and folate deficiency.  She is on oral R51 and folic acid.  Hematocrit was 23.6 with a hemoglobin of 8.0.  B12 was 201.  Folate was 5.8.  Creatinine was 1.65.  Ferritin was 474 with 17% iron sat and TIBC of 230.  ESR was 83.  Symptomatically, she has increased right femur pain. Plain films on 12/07/2016 revealed no acute fracture, subluxation, periosteal reaction or bone erosion.  Pain is poorly controlled.  Plan: 1.  Discuss management of pain. Discuss adjustment in current medications.  Discuss pain management with Hospice. 2.  Discuss rescheduling bone scan and possible palliative radiation if metastatic disease noted. 3.  Discuss patient's thoughts about consideration of Tecentriq.  Discuss Hospice care.  Quality of life issues discussed.  Patient would like to have better pain relief.  She is agreeable to Hospice evaluation. 4.  Rx:  Fentanyl 100 mcg/hr patch (dis: #10). 5.  Rx:  Percocet 7.5/325. 6.  Reschedule bone scan. 7.  RTC in 2 weeks for MD assessment.   Lequita Asal, MD 12/13/2016, 3:16 PM

## 2016-12-13 NOTE — ED Notes (Addendum)
Patient with chronic pain due to metastatic cancer.  Two Fentanyl patch in place to right scapula a 25mg  and 50mg  patches applied 12/12/16.  Last dose of percocet was taken at home 12/13/16 at 03:30am.

## 2016-12-13 NOTE — Discharge Instructions (Signed)
Please go to your appointment with Dr. Mike Gip today. You will discuss making a new appointment for the bone scan, as well as a long-term pain management plan.  Return to the emergency department if you develop fever, worsening pain, vomiting, skin changes including redness or swelling, or any other symptoms concerning to you.
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
## 2016-12-13 NOTE — Progress Notes (Signed)
Nephrostomy tube is out.  Patient is voiding okay.  Patient c/o pain in right leg.  Went to ED recently because of pain.  Given dilaudid.  Appetite decreased.  Not sleeping well.

## 2016-12-13 NOTE — ED Triage Notes (Addendum)
Pt presents to ED with right upper leg pain for the past 5 weeks. Pt states she is currently undergoing tx for cervical CA which has spread to her bones. Pt initially thought the leg pain was related to her CA. Pain has continued to worsen and is  relieved only by pain medication. no known injury. Filter in place from January after DVT was found.  bone scan has been scheduled.

## 2016-12-14 ENCOUNTER — Encounter: Payer: Self-pay | Admitting: Hematology and Oncology

## 2016-12-15 ENCOUNTER — Other Ambulatory Visit: Payer: Self-pay | Admitting: Hematology and Oncology

## 2016-12-15 DIAGNOSIS — C661 Malignant neoplasm of right ureter: Secondary | ICD-10-CM

## 2016-12-15 DIAGNOSIS — R630 Anorexia: Secondary | ICD-10-CM

## 2016-12-15 DIAGNOSIS — D649 Anemia, unspecified: Secondary | ICD-10-CM

## 2016-12-19 ENCOUNTER — Other Ambulatory Visit: Payer: Self-pay | Admitting: Hematology and Oncology

## 2016-12-25 ENCOUNTER — Encounter
Admission: RE | Admit: 2016-12-25 | Discharge: 2016-12-25 | Disposition: A | Discharge status: Home / Self Care | Payer: 59 | Source: Ambulatory Visit | Attending: Hematology and Oncology | Admitting: Hematology and Oncology

## 2016-12-25 DIAGNOSIS — G893 Neoplasm related pain (acute) (chronic): Secondary | ICD-10-CM

## 2016-12-25 DIAGNOSIS — C661 Malignant neoplasm of right ureter: Secondary | ICD-10-CM

## 2016-12-25 MED ORDER — TECHNETIUM TC 99M MEDRONATE IV KIT
25.0000 | PACK | Freq: Once | INTRAVENOUS | Status: AC | PRN
  Administered 2016-12-25: 23.64 via INTRAVENOUS

## 2016-12-26 ENCOUNTER — Other Ambulatory Visit: Payer: Self-pay | Admitting: Hematology and Oncology

## 2016-12-26 ENCOUNTER — Other Ambulatory Visit: Payer: Self-pay | Admitting: *Deleted

## 2016-12-26 DIAGNOSIS — C661 Malignant neoplasm of right ureter: Secondary | ICD-10-CM

## 2016-12-26 DIAGNOSIS — G893 Neoplasm related pain (acute) (chronic): Secondary | ICD-10-CM

## 2016-12-26 DIAGNOSIS — I82412 Acute embolism and thrombosis of left femoral vein: Secondary | ICD-10-CM

## 2016-12-26 MED ORDER — OXYCODONE-ACETAMINOPHEN 7.5-325 MG PO TABS: 1.0000 | ORAL_TABLET | ORAL | 0 refills | Status: DC | PRN

## 2016-12-26 MED ORDER — OXYCODONE-ACETAMINOPHEN 7.5-325 MG PO TABS: 1.0000 | ORAL_TABLET | ORAL | 0 refills | Status: AC | PRN

## 2016-12-26 NOTE — Telephone Encounter (Signed)
Notified patty that the rx was ready for pick up in [place], voiced understanding.

## 2016-12-28 ENCOUNTER — Encounter: Payer: Self-pay | Admitting: Hematology and Oncology

## 2016-12-28 ENCOUNTER — Inpatient Hospital Stay: Payer: 59 | Admitting: Hematology and Oncology

## 2016-12-28 ENCOUNTER — Inpatient Hospital Stay: Payer: 59 | Attending: Hematology and Oncology | Admitting: Hematology and Oncology

## 2016-12-28 VITALS — BP 100/68 | HR 116 | Temp 97.7°F | Wt 121.4 lb

## 2016-12-28 DIAGNOSIS — K589 Irritable bowel syndrome without diarrhea: Secondary | ICD-10-CM | POA: Diagnosis not present

## 2016-12-28 DIAGNOSIS — Z87891 Personal history of nicotine dependence: Secondary | ICD-10-CM | POA: Diagnosis not present

## 2016-12-28 DIAGNOSIS — D638 Anemia in other chronic diseases classified elsewhere: Secondary | ICD-10-CM | POA: Diagnosis not present

## 2016-12-28 DIAGNOSIS — F329 Major depressive disorder, single episode, unspecified: Secondary | ICD-10-CM | POA: Diagnosis not present

## 2016-12-28 DIAGNOSIS — J449 Chronic obstructive pulmonary disease, unspecified: Secondary | ICD-10-CM | POA: Diagnosis not present

## 2016-12-28 DIAGNOSIS — E538 Deficiency of other specified B group vitamins: Secondary | ICD-10-CM

## 2016-12-28 DIAGNOSIS — Z7189 Other specified counseling: Secondary | ICD-10-CM

## 2016-12-28 DIAGNOSIS — I824Z2 Acute embolism and thrombosis of unspecified deep veins of left distal lower extremity: Secondary | ICD-10-CM

## 2016-12-28 DIAGNOSIS — R000 Tachycardia, unspecified: Secondary | ICD-10-CM

## 2016-12-28 DIAGNOSIS — Z8669 Personal history of other diseases of the nervous system and sense organs: Secondary | ICD-10-CM

## 2016-12-28 DIAGNOSIS — I4891 Unspecified atrial fibrillation: Secondary | ICD-10-CM | POA: Diagnosis not present

## 2016-12-28 DIAGNOSIS — M5030 Other cervical disc degeneration, unspecified cervical region: Secondary | ICD-10-CM | POA: Diagnosis not present

## 2016-12-28 DIAGNOSIS — J45909 Unspecified asthma, uncomplicated: Secondary | ICD-10-CM | POA: Diagnosis not present

## 2016-12-28 DIAGNOSIS — M129 Arthropathy, unspecified: Secondary | ICD-10-CM

## 2016-12-28 DIAGNOSIS — M797 Fibromyalgia: Secondary | ICD-10-CM | POA: Diagnosis not present

## 2016-12-28 DIAGNOSIS — B1920 Unspecified viral hepatitis C without hepatic coma: Secondary | ICD-10-CM | POA: Diagnosis not present

## 2016-12-28 DIAGNOSIS — Z7901 Long term (current) use of anticoagulants: Secondary | ICD-10-CM

## 2016-12-28 DIAGNOSIS — C661 Malignant neoplasm of right ureter: Secondary | ICD-10-CM | POA: Diagnosis present

## 2016-12-28 DIAGNOSIS — F419 Anxiety disorder, unspecified: Secondary | ICD-10-CM

## 2016-12-28 DIAGNOSIS — G893 Neoplasm related pain (acute) (chronic): Secondary | ICD-10-CM | POA: Diagnosis not present

## 2016-12-28 DIAGNOSIS — R634 Abnormal weight loss: Secondary | ICD-10-CM | POA: Diagnosis not present

## 2016-12-28 DIAGNOSIS — C7951 Secondary malignant neoplasm of bone: Secondary | ICD-10-CM | POA: Diagnosis not present

## 2016-12-28 DIAGNOSIS — D72829 Elevated white blood cell count, unspecified: Secondary | ICD-10-CM

## 2016-12-28 DIAGNOSIS — Z86718 Personal history of other venous thrombosis and embolism: Secondary | ICD-10-CM | POA: Diagnosis not present

## 2016-12-28 DIAGNOSIS — K219 Gastro-esophageal reflux disease without esophagitis: Secondary | ICD-10-CM

## 2016-12-28 DIAGNOSIS — Z8701 Personal history of pneumonia (recurrent): Secondary | ICD-10-CM

## 2016-12-28 DIAGNOSIS — Z923 Personal history of irradiation: Secondary | ICD-10-CM | POA: Diagnosis not present

## 2016-12-28 DIAGNOSIS — Z79899 Other long term (current) drug therapy: Secondary | ICD-10-CM

## 2016-12-28 DIAGNOSIS — Z86010 Personal history of colonic polyps: Secondary | ICD-10-CM

## 2016-12-28 MED ORDER — OXYCODONE HCL 10 MG PO TABS: 10.0000 mg | ORAL_TABLET | ORAL | 0 refills | Status: DC | PRN

## 2016-12-28 MED ORDER — PREDNISONE 10 MG PO TABS: 10.0000 mg | ORAL_TABLET | Freq: Every day | ORAL | 0 refills | Status: AC

## 2016-12-28 MED ORDER — FENTANYL 25 MCG/HR TD PT72: 25.0000 ug | MEDICATED_PATCH | TRANSDERMAL | 0 refills | Status: AC

## 2016-12-28 NOTE — Progress Notes (Signed)
[facility] day:  12/28/2016   Chief Complaint: Sandra Landry is a 66 y.o. female with metastatic right ureteral cancer who is seen for 2 week assessment.  HPI: The patient was last seen in the medical oncology clinic on 12/13/2016.  At that time, she had increased right femur pain. Plain films on 12/07/2016 were negative.  Pain was poorly controlled.  Fentanyl was increased to 100 mcg/hr.  Bone scan was scheduled.  We discussed Hospice.  Bone scan on 12/25/2016 revealed mildly increased activity in the region S1 vertebral body where previous lytic and blastic lesion had been identified. There was no definite additional focus of increased activity.  Symptomatically, she continues to have significant pain.  Her sleep is interrupted.  She states that Hospice came out for an assessment. She plans to call them back contact them for re-evaluation.  She is using Fentanyl 100 mcg/hour. She is using Ativan as needed.   Past Medical History:  Diagnosis Date  . Anxiety   . Arthritis   . Asthma    mild  . Cervical disc disease   . COPD (chronic obstructive pulmonary disease) (HCC)    mild  . Depression   . Dysrhythmia    tachycardia  . Fibromyalgia   . GERD (gastroesophageal reflux disease)   . Hepatitis C     Past Surgical History:  Procedure Laterality Date  . cervical discectomy with fusion    . COLONOSCOPY  01/2016  . EUS N/A 06/21/2016   Procedure: LOWER ENDOSCOPIC ULTRASOUND (EUS);  Surgeon: Reita Cliche, MD;  Location: [facility] ENDOSCOPY;  Service: Endoscopy;  Laterality: N/A;    No family history on file.  Social History:  reports that she has never smoked. She has never used smokeless tobacco. She reports that she uses drugs, including Marijuana. She reports that she does not drink alcohol.  She lives in [place].  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  She has also worked in the Physicist, medical.  Her partner is Darliss Ridgel, a Marine scientist.   The patient lives in [place].  She is accompanied by Patty today.  Allergies:  Allergies  Allergen Reactions  . Other Swelling    Dust, ragweed  . Iodides   . Tetanus Toxoids Other (See Comments)    Localized pain and swelling  . Iodine Other (See Comments) and Rash    Limited history. Occurred in 43s, at age 31-12, received contrast at public health facility in [place], while being evaluated for meningitis. Immediate rash - not itchy, "flat, red, perfect circles" over entire body. Has not received iodine contrast since.    Current Medications: Current Outpatient Prescriptions  Medication Sig Dispense Refill  . baclofen (LIORESAL) 20 MG tablet Take 20 mg by mouth 4 (four) times daily as needed for muscle spasms.     . cetirizine (ZYRTEC) 10 MG tablet Take 10 mg by mouth daily.     Marland Kitchen diltiazem (CARDIZEM CD) 120 MG 24 hr capsule Take 120 mg by mouth daily.     . diphenhydrAMINE (BENADRYL) 25 MG tablet Take 25-50 mg by mouth every 6 (six) hours as needed.    Marland Kitchen escitalopram (LEXAPRO) 20 MG tablet Take 20 mg by mouth daily.     Marland Kitchen ibuprofen (ADVIL,MOTRIN) 200 MG tablet Take 200-600 mg by mouth every 6 (six) hours as needed for mild pain or moderate pain.    Marland Kitchen lactulose (CHRONULAC) 10 GM/15ML solution     .  oxyCODONE-acetaminophen (PERCOCET/ROXICET) 5-325 MG tablet     . polyethylene glycol (MIRALAX / GLYCOLAX) packet Take 17 g by mouth daily as needed.     . prochlorperazine (COMPAZINE) 5 MG/ML injection Inject 5-10 mg into the vein as directed. Every 4 to 6 hours as needed.    . promethazine (PHENERGAN) 25 MG suppository Place 25 mg rectally as directed. Every 4 to 6 hours as needed    . ranitidine (ZANTAC) 300 MG tablet Take 300 mg by mouth daily.     . traMADol (ULTRAM) 50 MG tablet Take 50-100 mg by mouth every 4 (four) hours as needed.     . potassium chloride (K-DUR) 10 MEQ tablet Take 2 tablets (20 mEq total) by mouth daily. 28 tablet 0  . potassium chloride (MICRO-K) 10 MEQ CR  capsule      No current facility-administered medications for this visit.     Review of Systems:  GENERAL:  Fatigue.  No fever or sweats.  Weight down 3 pounds. PERFORMANCE STATUS (ECOG):  2 HEENT:  Decreased vision in left eye (chronic).  Photophobia.  No runny nose, sore throat, mouth sores or tenderness. Lungs:  No shortness of breath or cough.  No hemoptysis.   Cardiac:  No chest pain, palpitations, orthopnea, or PND. GI:  Poor oral intake. No diarrhea, constipationmelena or hematochezia. GU:  Right urostomy tube s/p removal on 12/10/2016.  No urgency, frequency, or dysuria. Musculoskeletal:  Right sided back pain and hip pain.  Pain most severe in right upper leg.  No muscle tenderness. Extremities:  No swelling.  Right leg pain. Skin:  No rashes or skin changes. Neuro:  General weakness.  No focal numbness or weakness, or coordination issues. Endocrine:  No diabetes, thyroid issues, hot flashes or night sweats. Psych:  Hypersensitive.  PTSD.  Depression.  Anxiety. Pain:  Right leg pain (see HPI). Review of systems:  All other systems reviewed and found to be negative.  Physical Exam: BP 100/68 (BP Location: Left Arm, Patient Position: Lying left side)   Pulse (!) 116   Temp 97.7 F (36.5 C) (Tympanic)   Wt 121 lb 7 oz (55.1 kg)   BMI 19.60 kg/m  GENERAL:  Thin, chronically fatigued appearing woman somewhat uncomfortable  lying in the exam room. MENTAL STATUS:  Alert and oriented to person, place and time. HEAD:  Short brown hair.  Normocephalic, atraumatic, face symmetric, no Cushingoid features. EYES:  Wearing dark sunglasses.  Pupils equal round and reactive to light and accomodation.  No conjunctivitis or scleral icterus. ENT:  Oropharynx clear without lesion.  No upper teeth.  Tongue normal. Mucous membranes moist.  RESPIRATORY:  Clear to auscultation without rales, wheezes or rhonchi. CARDIOVASCULAR:  Regular rate and rhythm without murmur, rub or gallop. ABDOMEN:   Soft, nontender with active bowel sounds and no hepatosplenomegaly.   SKIN:  No rashes, ulcers or lesions. EXTREMITIES: Right upper leg sensitive to touch.  No skin discoloration or edema.  No palpable cords. LYMPH NODES: No palpable cervical, supraclavicular, axillary or inguinal adenopathy  NEUROLOGICAL:  Appropriate. PSYCH:  Appropriate.   No visits with results within 3 Day(s) from this visit.  Latest known visit with results is:  Appointment on 12/07/2016  Component Date Value Ref Range Status  . WBC 12/07/2016 12.2* 3.6 - 11.0 K/uL Final  . RBC 12/07/2016 3.00* 3.80 - 5.20 MIL/uL Final  . Hemoglobin 12/07/2016 9.4* 12.0 - 16.0 g/dL Final  . HCT 12/07/2016 27.5* 35.0 - 47.0 % Final  .  MCV 12/07/2016 91.6  80.0 - 100.0 fL Final  . MCH 12/07/2016 31.2  26.0 - 34.0 pg Final  . MCHC 12/07/2016 34.1  32.0 - 36.0 g/dL Final  . RDW 12/07/2016 17.4* 11.5 - 14.5 % Final  . Platelets 12/07/2016 267  150 - 440 K/uL Final  . Neutrophils Relative % 12/07/2016 82  % Final  . Neutro Abs 12/07/2016 10.1* 1.4 - 6.5 K/uL Final  . Lymphocytes Relative 12/07/2016 10  % Final  . Lymphs Abs 12/07/2016 1.3  1.0 - 3.6 K/uL Final  . Monocytes Relative 12/07/2016 5  % Final  . Monocytes Absolute 12/07/2016 0.6  0.2 - 0.9 K/uL Final  . Eosinophils Relative 12/07/2016 2  % Final  . Eosinophils Absolute 12/07/2016 0.2  0 - 0.7 K/uL Final  . Basophils Relative 12/07/2016 1  % Final  . Basophils Absolute 12/07/2016 0.1  0 - 0.1 K/uL Final  . Sodium 12/07/2016 135  135 - 145 mmol/L Final  . Potassium 12/07/2016 3.8  3.5 - 5.1 mmol/L Final  . Chloride 12/07/2016 105  101 - 111 mmol/L Final  . CO2 12/07/2016 22  22 - 32 mmol/L Final  . Glucose, Bld 12/07/2016 102* 65 - 99 mg/dL Final  . BUN 12/07/2016 17  6 - 20 mg/dL Final  . Creatinine, Ser 12/07/2016 1.18* 0.44 - 1.00 mg/dL Final  . Calcium 12/07/2016 9.1  8.9 - 10.3 mg/dL Final  . GFR calc non Af Amer 12/07/2016 48* >60 mL/min Final  . GFR calc Af  Amer 12/07/2016 56* >60 mL/min Final   Comment: (NOTE) The eGFR has been calculated using the CKD EPI equation. This calculation has not been validated in all clinical situations. eGFR's persistently <60 mL/min signify possible Chronic Kidney Disease.   . Anion gap 12/07/2016 8  5 - 15 Final    No visits with results within 3 Day(s) from this visit.  Latest known visit with results is:  Admission on 06/21/2016, Discharged on 06/21/2016  Component Date Value Ref Range Status  . SURGICAL PATHOLOGY 06/27/2016    Final                   Value:Surgical Pathology CASE: [id number] PATIENT: Quinetta Fountain Surgical Pathology Report     SPECIMEN SUBMITTED: A. Recto-sigmoid mass; cbx  CLINICAL HISTORY: None provided  PRE-OPERATIVE DIAGNOSIS: Right pelvic retroperitoneal mass.  Needs biopsy  POST-OPERATIVE DIAGNOSIS: Invasive peri-rectal mass     DIAGNOSIS: A. RECTOSIGMOID MASS; COLD BIOPSY: - UROTHELIAL CARCINOMA UNDERMINING COLONIC MUCOSA.  Comment:  A limited panel of immunohistochemical stains was performed. The neoplastic cells have the following immunoreactivity: Super pancytokeratin: Positive p40: Positive GATA-3: Positive SOX-1: Negative CD45: Negative Vimentin: Negative DOG-1: Negative PAX-8: Negative CDX-2: Negative p16: Negative (high background) These findings support the above diagnosis. These results were communicated to Dr. Mike Gip on 06/27/2016. Stain controls worked appropriately.  GROSS DESCRIPTION: A. Labeled: rectosigmoid mass C BX Tissue fragment(s): multiple S                         ize: aggregate, 0.8 x 0.6 x 0.1 cm Description: tan fragments  Entirely submitted in 1 cassette(s).  Final Diagnosis performed by Quay Burow, MD.  Electronically signed 06/27/2016 4:00:48PM    The electronic signature indicates that the named Attending Pathologist has evaluated the specimen  Technical component performed at [facility], [address] [place], [place] [address] Lab: [phone] Dir: Darrick Penna. Evette Doffing, MD  Professional component performed at  [place], [facility], [place], [place], [place] [address] Lab: [phone] Dir: Dellia Nims. Reuel Derby, MD      Assessment:  Sandra Landry is a 66 y.o. female with metastatic right ureteral carcinoma s/p biopsy.  She has a history of lower abdomen for 2 years felt secondary to irritable bowel syndrome.  She has had RLQ pain which has extended to her right buttock and hip for 3 months.  She has lost 38 pounds in the past year.  Lower endoscopic ultrasound on 06/21/2016 revealed an infiltrative 5.6 x 4.5 cm non-obstructing large mass in the recto-sigmoid colon extendiing from 14 cm to 18 cm from the anal verge. The mass was non-circumferential.  Biopsies revealed a urothelial carcinoma.  Pathologic stage was T4NxM0.  Abdominal and pelvic CT scan on 05/14/2016 revealed a 5.7 cm right lower quadrant retroperitoneal mass highly concerning for malignancy. The mass involved the sigmoid colon but is favored to reflect a primary retroperitoneal tumor (such as sarcoma) over a primary colonic mass.  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  There were multiple subcentimeter liver lesions, indeterminate.   PET scan on 06/06/2016 revealed hypermetabolism (SUV 19.7)  corresponding to the right lower quadrant retroperitoneal mass. It was felt intimately associated with the rectosigmoid colon, but originating outside of the colon. There was similar right-sided hydroureteronephrosis due to the right lower quadrant mass.  There was a 4 mm nodule in the left lower lobe (unclear significance).  Colonoscopy at [facility] on 07/18/2015 revealed 3 adenomatous polyps.  She underwent right nephrostomy tube placement on 07/11/2016.  Nephrostomy tube was removed on 12/10/2016.  She has a normocytic anemia.  Labs on 07/26/2016 revealed a  ferritin (284), iron saturation (4%), TIBC 179 (low) B12 (628), folate (7.1), and ESR (54).  She has had some blood from her nephrostomy tube.  She has anemia of chronic disease.  Diet is poor.  She received 1 unit PRBCs on 08/01/2016.  She has leukocytosis secondary to her underlying malignancy.  Work-up on 07/26/2016 revealed a negative urinalysis, blood cultures, and CXR.  Urinalysis revealed 20,000 colonies/ml Klebsiella pneumonia.  She received a course of ciprofloxacin.  Urine culture on 08/06/2016 revealed Stenotrophomonas maltophilia.  She was treated with a course of Levaquin.  Prior to treatment, she lost 35+ pounds.  She began Megace on 07/26/2016.  She has lost an additional 12 pounds with her recent admission to [facility].  She has received 2 cycles of cisplatin and gemcitabine (07/30/2016 - 09/04/2016).  Cisplatin was given as a split dose (day 1 and 2).  Course was complicated by an admission to [facility] on 08/08/2016 following a fall.  Head MRI revealed a subarachnoid hemorrhage.  She had atrial fibrillation with RVR.  Chest, abdomen, and pelvic CT scan on 08/08/2016 revealed a mild age-indeterminate compression fracture of T6. There was a 6 mm hypodensity at the dome of the liver too small to characterize and a few additional small subcentimeter hypodense scattered lesions in the liver. There was a 7.3 x 6.4 x 7.7 cm right pelvic mass invading the sacrum and causing a lytic lesion with pathologic fracture.  The mass likely invaded the right psoas muscle.  Left lower extremity duplex on 09/15/2016 revealed extensive left lower extremity DVT involving the entire visualized deep veins from the level of the common femoral vein to the calf veins.  An IVC filter was placed on 09/20/2015.  She is on Lovenox.  She completed palliative radiation  to her right pelvis on 11/22/2016.   Chest, abdomen, and pelvic CT on 10/25/2016 revealed mild decrease in size of right lower quadrant retroperitoneal and  adnexal mass. Although mildly decreased in size, there was increased osseous destruction of the adjacent right S1 vertebra.  There was resolution of right hydronephrosis status post percutaneous nephrostomy tube placement. There was stable diffuse right renal atrophy.  There was no evidence of metastatic disease within the chest or abdomen  She developed increased right femur pain.  Plain films of the right femur on 12/07/2016 revealed no abnormality.  Bone scan on 12/25/2016 revealed mildly increased activity in the region S1 vertebral body where previous lytic and blastic lesion had been identified.  She has a normocytic anemia.  Work-up on 11/05/2016 revealed B12 and folate deficiency.  She is on oral F11 and folic acid.  Hematocrit was 23.6 with a hemoglobin of 8.0.  B12 was 201.  Folate was 5.8.  Creatinine was 1.65.  Ferritin was 474 with 17% iron sat and TIBC of 230.  ESR was 83.  Symptomatically, she has increased right femur pain.  Pain is poorly controlled.  Plan: 1.  Discuss bone scan results.  No additional sites of metastatic disease were seen. 2.  Discuss pain management.  Pain is poorly controlled.  Discuss increasing long acting pain medication, allowing more flexibiity with short acting pain pills, and keeping a pain diary. 3.  Rx:  Fentanyl 25 mcg/hr (dis: #10).  Add to 100 mcg/hr patch (total 125 mcg/hr). 4.  Rx:  oxycodone 10 mg po q 2 hours prn pain (dis: # 40). 5.  Rx:  prednisone 10 mg po q day (dis: # 30). 6.  Discuss patient's thoughts about consideration of Tecentriq.  At this point, she is not planning on doing anything else. 7.  RTC in 1 week for reassessment.   Lequita Asal, MD 12/28/2016, 11:27 AM

## 2016-12-28 NOTE — Progress Notes (Signed)
Patient presents today in wheelchair.  Asking to lie down once in the exam room.  Appetite decreased.  Some nausea this past week.  Continues to have pain in right knee.

## 2016-12-31 ENCOUNTER — Ambulatory Visit: Payer: 59 | Admitting: Radiation Oncology

## 2017-01-01 ENCOUNTER — Encounter: Payer: Self-pay | Admitting: Radiation Oncology

## 2017-01-01 ENCOUNTER — Ambulatory Visit
Admission: RE | Admit: 2017-01-01 | Discharge: 2017-01-01 | Disposition: A | Discharge status: Home / Self Care | Payer: 59 | Source: Ambulatory Visit | Attending: Radiation Oncology | Admitting: Radiation Oncology

## 2017-01-01 ENCOUNTER — Other Ambulatory Visit: Payer: Self-pay | Admitting: *Deleted

## 2017-01-01 VITALS — BP 87/65 | HR 72 | Temp 96.8°F | Wt 112.2 lb

## 2017-01-01 DIAGNOSIS — C661 Malignant neoplasm of right ureter: Secondary | ICD-10-CM

## 2017-01-01 DIAGNOSIS — Z923 Personal history of irradiation: Secondary | ICD-10-CM | POA: Diagnosis not present

## 2017-01-01 DIAGNOSIS — M25561 Pain in right knee: Secondary | ICD-10-CM | POA: Diagnosis not present

## 2017-01-01 NOTE — Progress Notes (Signed)
Radiation Oncology Follow up Note  Name: Sandra Landry   Date:   01/01/2017 MRN:  [id number] DOB: January 01, 1951    This 66 y.o. female presents to the clinic today for one-month follow-up status post palliative radiation therapy to her right pelvis for urothelial carcinoma.  REFERRING PROVIDER: Lynnell Jude, MD  HPI: Patient is a 66 year old female now out 1 month having completed palliative radiation therapy to her right pelvis for lytic metastatic urethral carcinoma. She states pain in her pelvis has improved although she has radicular type pain in her right lower extremity into her right hip and radiating down to her right knee. She recently had a bone scan showed increased uptake in her lumbar spine on my review and I am ordering a MRI scan of her lumbar spine to rule out possible metastatic disease which would correlate with her radicular type pain.  COMPLICATIONS OF TREATMENT: none  FOLLOW UP COMPLIANCE: keeps appointments   PHYSICAL EXAM:  BP (!) 87/65   Pulse 72   Temp (!) 96.8 F (36 C)   Wt 112 lb 3.4 oz (50.9 kg)   BMI 18.11 kg/m  Range of motion of her lower extremity does not elicit pain. Motor sensory and DTR levels are equal and symmetric in the lower extremities. Deep palpation of her lumbar spine does not elicit pain. Well-developed well-nourished patient in NAD. HEENT reveals PERLA, EOMI, discs not visualized.  Oral cavity is clear. No oral mucosal lesions are identified. Neck is clear without evidence of cervical or supraclavicular adenopathy. Lungs are clear to A&P. Cardiac examination is essentially unremarkable with regular rate and rhythm without murmur rub or thrill. Abdomen is benign with no organomegaly or masses noted. Motor sensory and DTR levels are equal and symmetric in the upper and lower extremities. Cranial nerves II through XII are grossly intact. Proprioception is intact. No peripheral adenopathy or edema is identified. No motor or sensory levels are  noted. Crude visual fields are within normal range.  RADIOLOGY RESULTS: Bone scan is reviewed MRI of her lumbar spine ordered  PLAN: I will see her back a few days after her MRI of her lumbar spine. Should there be positive disease in that area which I suspect I would offer palliative course of radiation therapy to her lumbar spine 3000 cGy in 10 fractions. Otherwise would just follow up with medical oncology for narcotic control of her pain. Patient seems to compress my treatment plan well.  I would like to take this opportunity to thank you for allowing me to participate in the care of your patient.Armstead Peaks., MD

## 2017-01-04 ENCOUNTER — Inpatient Hospital Stay (HOSPITAL_BASED_OUTPATIENT_CLINIC_OR_DEPARTMENT_OTHER): Payer: 59 | Admitting: Hematology and Oncology

## 2017-01-04 VITALS — BP 92/62 | HR 115 | Temp 97.0°F

## 2017-01-04 DIAGNOSIS — D638 Anemia in other chronic diseases classified elsewhere: Secondary | ICD-10-CM

## 2017-01-04 DIAGNOSIS — D72829 Elevated white blood cell count, unspecified: Secondary | ICD-10-CM

## 2017-01-04 DIAGNOSIS — M5030 Other cervical disc degeneration, unspecified cervical region: Secondary | ICD-10-CM | POA: Diagnosis not present

## 2017-01-04 DIAGNOSIS — Z79899 Other long term (current) drug therapy: Secondary | ICD-10-CM

## 2017-01-04 DIAGNOSIS — M129 Arthropathy, unspecified: Secondary | ICD-10-CM | POA: Diagnosis not present

## 2017-01-04 DIAGNOSIS — J449 Chronic obstructive pulmonary disease, unspecified: Secondary | ICD-10-CM | POA: Diagnosis not present

## 2017-01-04 DIAGNOSIS — C661 Malignant neoplasm of right ureter: Secondary | ICD-10-CM

## 2017-01-04 DIAGNOSIS — R000 Tachycardia, unspecified: Secondary | ICD-10-CM | POA: Diagnosis not present

## 2017-01-04 DIAGNOSIS — E538 Deficiency of other specified B group vitamins: Secondary | ICD-10-CM

## 2017-01-04 DIAGNOSIS — K589 Irritable bowel syndrome without diarrhea: Secondary | ICD-10-CM

## 2017-01-04 DIAGNOSIS — M797 Fibromyalgia: Secondary | ICD-10-CM

## 2017-01-04 DIAGNOSIS — C7951 Secondary malignant neoplasm of bone: Secondary | ICD-10-CM

## 2017-01-04 DIAGNOSIS — Z86718 Personal history of other venous thrombosis and embolism: Secondary | ICD-10-CM

## 2017-01-04 DIAGNOSIS — J45909 Unspecified asthma, uncomplicated: Secondary | ICD-10-CM

## 2017-01-04 DIAGNOSIS — Z7901 Long term (current) use of anticoagulants: Secondary | ICD-10-CM

## 2017-01-04 DIAGNOSIS — Z87891 Personal history of nicotine dependence: Secondary | ICD-10-CM

## 2017-01-04 DIAGNOSIS — Z86010 Personal history of colonic polyps: Secondary | ICD-10-CM

## 2017-01-04 DIAGNOSIS — F419 Anxiety disorder, unspecified: Secondary | ICD-10-CM

## 2017-01-04 DIAGNOSIS — R634 Abnormal weight loss: Secondary | ICD-10-CM

## 2017-01-04 DIAGNOSIS — K219 Gastro-esophageal reflux disease without esophagitis: Secondary | ICD-10-CM

## 2017-01-04 DIAGNOSIS — F329 Major depressive disorder, single episode, unspecified: Secondary | ICD-10-CM | POA: Diagnosis not present

## 2017-01-04 DIAGNOSIS — G893 Neoplasm related pain (acute) (chronic): Secondary | ICD-10-CM

## 2017-01-04 DIAGNOSIS — B1920 Unspecified viral hepatitis C without hepatic coma: Secondary | ICD-10-CM

## 2017-01-04 DIAGNOSIS — Z8669 Personal history of other diseases of the nervous system and sense organs: Secondary | ICD-10-CM

## 2017-01-04 DIAGNOSIS — I4891 Unspecified atrial fibrillation: Secondary | ICD-10-CM

## 2017-01-04 DIAGNOSIS — Z923 Personal history of irradiation: Secondary | ICD-10-CM

## 2017-01-04 DIAGNOSIS — Z8701 Personal history of pneumonia (recurrent): Secondary | ICD-10-CM

## 2017-01-04 NOTE — Progress Notes (Signed)
[facility] day:  01/04/2017   Chief Complaint: Sandra Landry is a 66 y.o. female with metastatic right ureteral cancer who is seen for 1 week assessment of pain.  HPI: The patient was last seen in the medical oncology clinic on 12/28/2016.  At that time, bone scan was reviewed.  There was mildly increased activity in the region S1 vertebral body where previous lytic and blastic lesion had been identified. There was no definite additional focus of increased activity.  Pain was poorly controlled.  Fentanyl patch was increased to 125 mcg/hr.  She was given an Rx for oxycodone 10 mg q 2 hours prn pain.  She began prednisone 10 mg po q day.  She saw Dr. Baruch Gouty on 01/01/2017.  She was scheduled for a lumbar spine MRI on 01/10/2017.  Symptomatically, pain has gone from a level 5-10 to a level 4-5. She is using oxycodone 5 times a day. She is sleeping better. She remains on Fentanyl 125 mcg/hour. She states the prednisone has helped with her appetite. She can walk about 8-10 feet with her walker. She is using a bedside commode.   Past Medical History:  Diagnosis Date  . Anxiety   . Arthritis   . Asthma    mild  . Cervical disc disease   . COPD (chronic obstructive pulmonary disease) (HCC)    mild  . Depression   . Dysrhythmia    tachycardia  . Fibromyalgia   . GERD (gastroesophageal reflux disease)   . Hepatitis C     Past Surgical History:  Procedure Laterality Date  . cervical discectomy with fusion    . COLONOSCOPY  01/2016  . EUS N/A 06/21/2016   Procedure: LOWER ENDOSCOPIC ULTRASOUND (EUS);  Surgeon: Reita Cliche, MD;  Location: [facility] ENDOSCOPY;  Service: Endoscopy;  Laterality: N/A;    No family history on file.  Social History:  reports that she has never smoked. She has never used smokeless tobacco. She reports that she uses drugs, including Marijuana. She reports that she does not drink alcohol.  She lives in [place].  She is a  former truck and Recruitment consultant.  She has also worked in the Physicist, medical.  Her partner is Darliss Ridgel, a Marine scientist.  The patient lives in [place].  She is accompanied by Patty today.  Allergies:  Allergies  Allergen Reactions  . Other Swelling    Dust, ragweed  . Iodides   . Tetanus Toxoids Other (See Comments)    Localized pain and swelling  . Iodine Other (See Comments) and Rash    Limited history. Occurred in 50s, at age 43-12, received contrast at public health facility in [place], while being evaluated for meningitis. Immediate rash - not itchy, "flat, red, perfect circles" over entire body. Has not received iodine contrast since.    Current Medications: Current Outpatient Prescriptions  Medication Sig Dispense Refill  . baclofen (LIORESAL) 20 MG tablet Take 20 mg by mouth 4 (four) times daily as needed for muscle spasms.     . cetirizine (ZYRTEC) 10 MG tablet Take 10 mg by mouth daily.     Marland Kitchen diltiazem (CARDIZEM CD) 120 MG 24 hr capsule Take 120 mg by mouth daily.     . diphenhydrAMINE (BENADRYL) 25 MG tablet Take 25-50 mg by mouth every 6 (six) hours as needed.    Marland Kitchen escitalopram (LEXAPRO) 20 MG tablet Take 20 mg by mouth daily.     Marland Kitchen ibuprofen (ADVIL,MOTRIN)  200 MG tablet Take 200-600 mg by mouth every 6 (six) hours as needed for mild pain or moderate pain.    Marland Kitchen lactulose (CHRONULAC) 10 GM/15ML solution     . oxyCODONE-acetaminophen (PERCOCET/ROXICET) 5-325 MG tablet     . polyethylene glycol (MIRALAX / GLYCOLAX) packet Take 17 g by mouth daily as needed.     . prochlorperazine (COMPAZINE) 5 MG/ML injection Inject 5-10 mg into the vein as directed. Every 4 to 6 hours as needed.    . promethazine (PHENERGAN) 25 MG suppository Place 25 mg rectally as directed. Every 4 to 6 hours as needed    . ranitidine (ZANTAC) 300 MG tablet Take 300 mg by mouth daily.     . traMADol (ULTRAM) 50 MG tablet Take 50-100 mg by mouth every 4 (four) hours as needed.     . potassium chloride (K-DUR) 10  MEQ tablet Take 2 tablets (20 mEq total) by mouth daily. 28 tablet 0  . potassium chloride (MICRO-K) 10 MEQ CR capsule      No current facility-administered medications for this visit.     Review of Systems:  GENERAL:  Feels better.  No fever or sweats.  No new weight. PERFORMANCE STATUS (ECOG):  2-3 HEENT:  Decreased vision in left eye (chronic).  Photophobia.  No runny nose, sore throat, mouth sores or tenderness. Lungs:  No shortness of breath or cough.  No hemoptysis. Cardiac:  No chest pain, palpitations, orthopnea, or PND. GI:  Appetite improved on prednisone. No diarrhea, constipation, melena or hematochezia. GU:  Right urostomy tube s/p removal on 12/10/2016.  No urgency, frequency, or dysuria. Musculoskeletal:  Right sided back pain and hip pain, improved.  No muscle tenderness. Extremities:  No swelling.  Right leg pain. Skin:  No rashes or skin changes. Neuro:  General weakness.  No focal numbness or weakness, or coordination issues. Endocrine:  No diabetes, thyroid issues, hot flashes or night sweats. Psych:  Hypersensitive.  PTSD.  Depression.  Anxiety. Pain:  Right leg pain (4-5 out of 10). Review of systems:  All other systems reviewed and found to be negative.  Physical Exam: BP 92/62 (BP Location: Left Arm)   Pulse (!) 115 Comment: Patient in pain  Temp 97 F (36.1 C) (Tympanic)  GENERAL:  Thin, chronically fatigued appearing woman sitting comfortably in the exam room. MENTAL STATUS:  Alert and oriented to person, place and time. HEAD:  Short brown hair.  Normocephalic, atraumatic, face symmetric, no Cushingoid features. EYES:  Wearing dark sunglasses.  Pupils equal round and reactive to light and accomodation.  No conjunctivitis or scleral icterus. ENT:  Oropharynx clear without lesion.  No upper teeth.  Tongue normal. Mucous membranes moist.  RESPIRATORY:  Clear to auscultation without rales, wheezes or rhonchi. CARDIOVASCULAR:  Regular rate and rhythm without  murmur, rub or gallop. ABDOMEN:  Soft, nontender with active bowel sounds and no hepatosplenomegaly.   SKIN:  No rashes, ulcers or lesions. EXTREMITIES: No skin discoloration or edema.  No palpable cords. LYMPH NODES: No palpable cervical, supraclavicular, axillary or inguinal adenopathy  NEUROLOGICAL:  Appropriate. PSYCH:  Appropriate.   No visits with results within 3 Day(s) from this visit.  Latest known visit with results is:  Appointment on 12/07/2016  Component Date Value Ref Range Status  . WBC 12/07/2016 12.2* 3.6 - 11.0 K/uL Final  . RBC 12/07/2016 3.00* 3.80 - 5.20 MIL/uL Final  . Hemoglobin 12/07/2016 9.4* 12.0 - 16.0 g/dL Final  . HCT 12/07/2016 27.5* 35.0 -  47.0 % Final  . MCV 12/07/2016 91.6  80.0 - 100.0 fL Final  . MCH 12/07/2016 31.2  26.0 - 34.0 pg Final  . MCHC 12/07/2016 34.1  32.0 - 36.0 g/dL Final  . RDW 12/07/2016 17.4* 11.5 - 14.5 % Final  . Platelets 12/07/2016 267  150 - 440 K/uL Final  . Neutrophils Relative % 12/07/2016 82  % Final  . Neutro Abs 12/07/2016 10.1* 1.4 - 6.5 K/uL Final  . Lymphocytes Relative 12/07/2016 10  % Final  . Lymphs Abs 12/07/2016 1.3  1.0 - 3.6 K/uL Final  . Monocytes Relative 12/07/2016 5  % Final  . Monocytes Absolute 12/07/2016 0.6  0.2 - 0.9 K/uL Final  . Eosinophils Relative 12/07/2016 2  % Final  . Eosinophils Absolute 12/07/2016 0.2  0 - 0.7 K/uL Final  . Basophils Relative 12/07/2016 1  % Final  . Basophils Absolute 12/07/2016 0.1  0 - 0.1 K/uL Final  . Sodium 12/07/2016 135  135 - 145 mmol/L Final  . Potassium 12/07/2016 3.8  3.5 - 5.1 mmol/L Final  . Chloride 12/07/2016 105  101 - 111 mmol/L Final  . CO2 12/07/2016 22  22 - 32 mmol/L Final  . Glucose, Bld 12/07/2016 102* 65 - 99 mg/dL Final  . BUN 12/07/2016 17  6 - 20 mg/dL Final  . Creatinine, Ser 12/07/2016 1.18* 0.44 - 1.00 mg/dL Final  . Calcium 12/07/2016 9.1  8.9 - 10.3 mg/dL Final  . GFR calc non Af Amer 12/07/2016 48* >60 mL/min Final  . GFR calc Af Amer  12/07/2016 56* >60 mL/min Final   Comment: (NOTE) The eGFR has been calculated using the CKD EPI equation. This calculation has not been validated in all clinical situations. eGFR's persistently <60 mL/min signify possible Chronic Kidney Disease.   . Anion gap 12/07/2016 8  5 - 15 Final    No visits with results within 3 Day(s) from this visit.  Latest known visit with results is:  Admission on 06/21/2016, Discharged on 06/21/2016  Component Date Value Ref Range Status  . SURGICAL PATHOLOGY 06/27/2016    Final                   Value:Surgical Pathology CASE: [id number] PATIENT: Peighton Korol Surgical Pathology Report     SPECIMEN SUBMITTED: A. Recto-sigmoid mass; cbx  CLINICAL HISTORY: None provided  PRE-OPERATIVE DIAGNOSIS: Right pelvic retroperitoneal mass.  Needs biopsy  POST-OPERATIVE DIAGNOSIS: Invasive peri-rectal mass     DIAGNOSIS: A. RECTOSIGMOID MASS; COLD BIOPSY: - UROTHELIAL CARCINOMA UNDERMINING COLONIC MUCOSA.  Comment:  A limited panel of immunohistochemical stains was performed. The neoplastic cells have the following immunoreactivity: Super pancytokeratin: Positive p40: Positive GATA-3: Positive SOX-1: Negative CD45: Negative Vimentin: Negative DOG-1: Negative PAX-8: Negative CDX-2: Negative p16: Negative (high background) These findings support the above diagnosis. These results were communicated to Dr. Mike Gip on 06/27/2016. Stain controls worked appropriately.  GROSS DESCRIPTION: A. Labeled: rectosigmoid mass C BX Tissue fragment(s): multiple S                         ize: aggregate, 0.8 x 0.6 x 0.1 cm Description: tan fragments  Entirely submitted in 1 cassette(s).  Final Diagnosis performed by Quay Burow, MD.  Electronically signed 06/27/2016 4:00:48PM    The electronic signature indicates that the named Attending Pathologist has evaluated the specimen  Technical component performed at [facility], [address]  [place], [place] [address] Lab: [phone] Dir: Darrick Penna. Evette Doffing, MD  Professional component performed at [facility], [facility], [place], [place], [place] [address] Lab: [phone] Dir: Dellia Nims. Reuel Derby, MD      Assessment:  Shakerra Red is a 66 y.o. female with metastatic right ureteral carcinoma s/p biopsy.  She has a history of lower abdomen for 2 years felt secondary to irritable bowel syndrome.  She has had RLQ pain which has extended to her right buttock and hip for 3 months.  She has lost 38 pounds in the past year.  Lower endoscopic ultrasound on 06/21/2016 revealed an infiltrative 5.6 x 4.5 cm non-obstructing large mass in the recto-sigmoid colon extendiing from 14 cm to 18 cm from the anal verge. The mass was non-circumferential.  Biopsies revealed a urothelial carcinoma.  Pathologic stage was T4NxM0.  Abdominal and pelvic CT scan on 05/14/2016 revealed a 5.7 cm right lower quadrant retroperitoneal mass highly concerning for malignancy. The mass involved the sigmoid colon but is favored to reflect a primary retroperitoneal tumor (such as sarcoma) over a primary colonic mass.  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  There were multiple subcentimeter liver lesions, indeterminate.   PET scan on 06/06/2016 revealed hypermetabolism (SUV 19.7)  corresponding to the right lower quadrant retroperitoneal mass. It was felt intimately associated with the rectosigmoid colon, but originating outside of the colon. There was similar right-sided hydroureteronephrosis due to the right lower quadrant mass.  There was a 4 mm nodule in the left lower lobe (unclear significance).  Colonoscopy at [facility] on 07/18/2015 revealed 3 adenomatous polyps.  She underwent right nephrostomy tube placement on 07/11/2016.  Nephrostomy tube was removed on 12/10/2016.  She has a normocytic anemia.  Labs on 07/26/2016 revealed a ferritin  (284), iron saturation (4%), TIBC 179 (low) B12 (628), folate (7.1), and ESR (54).  She has had some blood from her nephrostomy tube.  She has anemia of chronic disease.  Diet is poor.  She received 1 unit PRBCs on 08/01/2016.  She has leukocytosis secondary to her underlying malignancy.  Work-up on 07/26/2016 revealed a negative urinalysis, blood cultures, and CXR.  Urinalysis revealed 20,000 colonies/ml Klebsiella pneumonia.  She received a course of ciprofloxacin.  Urine culture on 08/06/2016 revealed Stenotrophomonas maltophilia.  She was treated with a course of Levaquin.  Prior to treatment, she lost 35+ pounds.  She began Megace on 07/26/2016.  She has lost an additional 12 pounds with her recent admission to [facility].  She has received 2 cycles of cisplatin and gemcitabine (07/30/2016 - 09/04/2016).  Cisplatin was given as a split dose (day 1 and 2).  Course was complicated by an admission to [facility] on 08/08/2016 following a fall.  Head MRI revealed a subarachnoid hemorrhage.  She had atrial fibrillation with RVR.  Chest, abdomen, and pelvic CT scan on 08/08/2016 revealed a mild age-indeterminate compression fracture of T6. There was a 6 mm hypodensity at the dome of the liver too small to characterize and a few additional small subcentimeter hypodense scattered lesions in the liver. There was a 7.3 x 6.4 x 7.7 cm right pelvic mass invading the sacrum and causing a lytic lesion with pathologic fracture.  The mass likely invaded the right psoas muscle.  Left lower extremity duplex on 09/15/2016 revealed extensive left lower extremity DVT involving the entire visualized deep veins from the level of the common femoral vein to the calf veins.  An IVC filter was placed on 09/20/2015.  She is on Lovenox.  She completed palliative radiation to her right pelvis on 11/22/2016.   Chest, abdomen, and pelvic CT on 10/25/2016 revealed mild decrease in size of right lower quadrant retroperitoneal and adnexal mass.  Although mildly decreased in size, there was increased osseous destruction of the adjacent right S1 vertebra.  There was resolution of right hydronephrosis status post percutaneous nephrostomy tube placement. There was stable diffuse right renal atrophy.  There was no evidence of metastatic disease within the chest or abdomen  She developed increased right femur pain.  Plain films of the right femur on 12/07/2016 revealed no abnormality.  Bone scan on 12/25/2016 revealed mildly increased activity in the region S1 vertebral body where previous lytic and blastic lesion had been identified.  She has a normocytic anemia.  Work-up on 11/05/2016 revealed B12 and folate deficiency.  She is on oral C45 and folic acid.  Hematocrit was 23.6 with a hemoglobin of 8.0.  B12 was 201.  Folate was 5.8.  Creatinine was 1.65.  Ferritin was 474 with 17% iron sat and TIBC of 230.  ESR was 83.  Symptomatically, pain control has improved.  She is on Fentanyl 125 mcg/hr and oxycodone 10 mg po q 2 hours prn pain (typically 5/day).  Plan: 1.  Discuss pain management.  Current pain under good control.  Plan to continue current regimen.  Contact the clinic if pain control declines. 2.  Continue Fentanyl 125 mcg/hr and oxycodone 10 mg po q 2 hours prn pain. 3.  Continue prednisone 10 mg po q day. 4.  Anticipate pelvic MRI on 01/10/2017. 5.  RTC in 2 weeks for MD assessment.   Lequita Asal, MD 01/04/2017, 12:35 PM

## 2017-01-04 NOTE — Progress Notes (Signed)
Patient here for follow up. She has an MRI scheduled for her leg pain.

## 2017-01-06 ENCOUNTER — Other Ambulatory Visit: Payer: Self-pay | Admitting: Hematology and Oncology

## 2017-01-06 DIAGNOSIS — G893 Neoplasm related pain (acute) (chronic): Secondary | ICD-10-CM

## 2017-01-06 DIAGNOSIS — C661 Malignant neoplasm of right ureter: Secondary | ICD-10-CM

## 2017-01-06 DIAGNOSIS — I824Z2 Acute embolism and thrombosis of unspecified deep veins of left distal lower extremity: Secondary | ICD-10-CM

## 2017-01-07 ENCOUNTER — Other Ambulatory Visit: Payer: Self-pay | Admitting: *Deleted

## 2017-01-07 DIAGNOSIS — I824Z2 Acute embolism and thrombosis of unspecified deep veins of left distal lower extremity: Secondary | ICD-10-CM

## 2017-01-07 DIAGNOSIS — C661 Malignant neoplasm of right ureter: Secondary | ICD-10-CM

## 2017-01-07 DIAGNOSIS — G893 Neoplasm related pain (acute) (chronic): Secondary | ICD-10-CM

## 2017-01-07 MED ORDER — OXYCODONE HCL 10 MG PO TABS: 10.0000 mg | ORAL_TABLET | ORAL | 0 refills | Status: AC | PRN

## 2017-01-08 ENCOUNTER — Other Ambulatory Visit: Payer: Self-pay | Admitting: *Deleted

## 2017-01-10 ENCOUNTER — Ambulatory Visit: Payer: 59

## 2017-01-10 ENCOUNTER — Other Ambulatory Visit: Payer: Self-pay | Admitting: *Deleted

## 2017-01-10 DIAGNOSIS — C661 Malignant neoplasm of right ureter: Secondary | ICD-10-CM

## 2017-01-11 ENCOUNTER — Other Ambulatory Visit: Payer: Self-pay | Admitting: *Deleted

## 2017-01-11 DIAGNOSIS — C661 Malignant neoplasm of right ureter: Secondary | ICD-10-CM

## 2017-01-11 DIAGNOSIS — I82412 Acute embolism and thrombosis of left femoral vein: Secondary | ICD-10-CM

## 2017-01-11 DIAGNOSIS — G893 Neoplasm related pain (acute) (chronic): Secondary | ICD-10-CM

## 2017-01-11 MED ORDER — FENTANYL 100 MCG/HR TD PT72: 100.0000 ug | MEDICATED_PATCH | TRANSDERMAL | 0 refills | Status: AC

## 2017-01-14 ENCOUNTER — Ambulatory Visit: Payer: 59 | Admitting: Radiation Oncology

## 2017-01-14 ENCOUNTER — Other Ambulatory Visit: Payer: Self-pay | Admitting: Hematology and Oncology

## 2017-01-14 ENCOUNTER — Telehealth: Payer: Self-pay | Admitting: *Deleted

## 2017-01-14 DIAGNOSIS — C661 Malignant neoplasm of right ureter: Secondary | ICD-10-CM

## 2017-01-14 DIAGNOSIS — G893 Neoplasm related pain (acute) (chronic): Secondary | ICD-10-CM

## 2017-01-14 DIAGNOSIS — I82412 Acute embolism and thrombosis of left femoral vein: Secondary | ICD-10-CM

## 2017-01-14 NOTE — Telephone Encounter (Signed)
Called Patty and informed her that patient's prescription for Fentanyl 100 is at front desk.

## 2017-01-15 ENCOUNTER — Inpatient Hospital Stay: Payer: 59

## 2017-01-15 ENCOUNTER — Other Ambulatory Visit: Payer: Self-pay

## 2017-01-15 ENCOUNTER — Inpatient Hospital Stay
Admission: EM | Admit: 2017-01-15 | Discharge: 2017-02-15 | DRG: 378 | Disposition: E | Payer: 59 | Attending: Internal Medicine | Admitting: Internal Medicine

## 2017-01-15 ENCOUNTER — Telehealth: Payer: Self-pay | Admitting: *Deleted

## 2017-01-15 ENCOUNTER — Encounter: Payer: Self-pay | Admitting: *Deleted

## 2017-01-15 ENCOUNTER — Encounter: Payer: Self-pay | Admitting: Radiation Oncology

## 2017-01-15 ENCOUNTER — Ambulatory Visit
Admission: RE | Admit: 2017-01-15 | Discharge: 2017-01-15 | Disposition: A | Discharge status: Home / Self Care | Payer: 59 | Source: Ambulatory Visit | Attending: Radiation Oncology | Admitting: Radiation Oncology

## 2017-01-15 DIAGNOSIS — D62 Acute posthemorrhagic anemia: Secondary | ICD-10-CM | POA: Diagnosis present

## 2017-01-15 DIAGNOSIS — Z9181 History of falling: Secondary | ICD-10-CM | POA: Diagnosis not present

## 2017-01-15 DIAGNOSIS — K648 Other hemorrhoids: Secondary | ICD-10-CM | POA: Diagnosis present

## 2017-01-15 DIAGNOSIS — G894 Chronic pain syndrome: Secondary | ICD-10-CM | POA: Diagnosis present

## 2017-01-15 DIAGNOSIS — C661 Malignant neoplasm of right ureter: Secondary | ICD-10-CM | POA: Diagnosis present

## 2017-01-15 DIAGNOSIS — M797 Fibromyalgia: Secondary | ICD-10-CM | POA: Diagnosis present

## 2017-01-15 DIAGNOSIS — C7951 Secondary malignant neoplasm of bone: Secondary | ICD-10-CM | POA: Diagnosis present

## 2017-01-15 DIAGNOSIS — C480 Malignant neoplasm of retroperitoneum: Secondary | ICD-10-CM | POA: Diagnosis present

## 2017-01-15 DIAGNOSIS — M4856XA Collapsed vertebra, not elsewhere classified, lumbar region, initial encounter for fracture: Secondary | ICD-10-CM | POA: Insufficient documentation

## 2017-01-15 DIAGNOSIS — K219 Gastro-esophageal reflux disease without esophagitis: Secondary | ICD-10-CM | POA: Diagnosis present

## 2017-01-15 DIAGNOSIS — K5900 Constipation, unspecified: Secondary | ICD-10-CM | POA: Diagnosis present

## 2017-01-15 DIAGNOSIS — C189 Malignant neoplasm of colon, unspecified: Secondary | ICD-10-CM | POA: Diagnosis not present

## 2017-01-15 DIAGNOSIS — K922 Gastrointestinal hemorrhage, unspecified: Secondary | ICD-10-CM | POA: Diagnosis present

## 2017-01-15 DIAGNOSIS — J449 Chronic obstructive pulmonary disease, unspecified: Secondary | ICD-10-CM | POA: Diagnosis present

## 2017-01-15 DIAGNOSIS — Z66 Do not resuscitate: Secondary | ICD-10-CM | POA: Diagnosis present

## 2017-01-15 DIAGNOSIS — K921 Melena: Principal | ICD-10-CM | POA: Diagnosis present

## 2017-01-15 DIAGNOSIS — K625 Hemorrhage of anus and rectum: Secondary | ICD-10-CM | POA: Diagnosis present

## 2017-01-15 DIAGNOSIS — I9589 Other hypotension: Secondary | ICD-10-CM

## 2017-01-15 DIAGNOSIS — Z86718 Personal history of other venous thrombosis and embolism: Secondary | ICD-10-CM | POA: Diagnosis not present

## 2017-01-15 DIAGNOSIS — Z923 Personal history of irradiation: Secondary | ICD-10-CM

## 2017-01-15 DIAGNOSIS — M5126 Other intervertebral disc displacement, lumbar region: Secondary | ICD-10-CM | POA: Insufficient documentation

## 2017-01-15 DIAGNOSIS — I959 Hypotension, unspecified: Secondary | ICD-10-CM | POA: Diagnosis present

## 2017-01-15 DIAGNOSIS — G893 Neoplasm related pain (acute) (chronic): Secondary | ICD-10-CM | POA: Diagnosis present

## 2017-01-15 DIAGNOSIS — T17908A Unspecified foreign body in respiratory tract, part unspecified causing other injury, initial encounter: Secondary | ICD-10-CM

## 2017-01-15 DIAGNOSIS — M899 Disorder of bone, unspecified: Secondary | ICD-10-CM

## 2017-01-15 DIAGNOSIS — Z9221 Personal history of antineoplastic chemotherapy: Secondary | ICD-10-CM | POA: Diagnosis not present

## 2017-01-15 DIAGNOSIS — D649 Anemia, unspecified: Secondary | ICD-10-CM

## 2017-01-15 LAB — CBC
HEMATOCRIT: 24.8 % — AB (ref 35.0–47.0)
HEMOGLOBIN: 8.1 g/dL — AB (ref 12.0–16.0)
MCH: 30 pg (ref 26.0–34.0)
MCHC: 32.5 g/dL (ref 32.0–36.0)
MCV: 92.2 fL (ref 80.0–100.0)
Platelets: 351 10*3/uL (ref 150–440)
RBC: 2.69 MIL/uL — ABNORMAL LOW (ref 3.80–5.20)
RDW: 17.6 % — AB (ref 11.5–14.5)
WBC: 16.1 10*3/uL — ABNORMAL HIGH (ref 3.6–11.0)

## 2017-01-15 LAB — COMPREHENSIVE METABOLIC PANEL
ALK PHOS: 39 U/L (ref 38–126)
ALT: 6 U/L — ABNORMAL LOW (ref 14–54)
ANION GAP: 8 (ref 5–15)
AST: 12 U/L — AB (ref 15–41)
Albumin: 2.7 g/dL — ABNORMAL LOW (ref 3.5–5.0)
BILIRUBIN TOTAL: 0.4 mg/dL (ref 0.3–1.2)
BUN: 24 mg/dL — AB (ref 6–20)
CALCIUM: 8.7 mg/dL — AB (ref 8.9–10.3)
CO2: 22 mmol/L (ref 22–32)
Chloride: 105 mmol/L (ref 101–111)
Creatinine, Ser: 1.45 mg/dL — ABNORMAL HIGH (ref 0.44–1.00)
GFR calc Af Amer: 43 mL/min — ABNORMAL LOW (ref >60)
GFR, EST NON AFRICAN AMERICAN: 37 mL/min — AB (ref >60)
GLUCOSE: 95 mg/dL (ref 65–99)
POTASSIUM: 3.7 mmol/L (ref 3.5–5.1)
Sodium: 135 mmol/L (ref 135–145)
TOTAL PROTEIN: 6.4 g/dL — AB (ref 6.5–8.1)

## 2017-01-15 LAB — PROTIME-INR
INR: 0.96
PROTHROMBIN TIME: 12.8 s (ref 11.4–15.2)

## 2017-01-15 LAB — GLUCOSE, CAPILLARY
GLUCOSE-CAPILLARY: 98 mg/dL (ref 65–99)
Glucose-Capillary: 117 mg/dL — ABNORMAL HIGH (ref 65–99)

## 2017-01-15 LAB — APTT: aPTT: 26 seconds (ref 24–36)

## 2017-01-15 LAB — MRSA PCR SCREENING: MRSA by PCR: NEGATIVE

## 2017-01-15 MED ORDER — DILTIAZEM HCL ER COATED BEADS 240 MG PO CP24
240.0000 mg | ORAL_CAPSULE | Freq: Every day | ORAL | Status: DC
  Administered 2017-01-15: 240 mg via ORAL
  Filled 2017-01-15: qty 1

## 2017-01-15 MED ORDER — PREDNISONE 20 MG PO TABS: 10.0000 mg | ORAL_TABLET | Freq: Every day | ORAL | Status: DC

## 2017-01-15 MED ORDER — SODIUM CHLORIDE 0.9 % IV SOLN
10.0000 mL/h | Freq: Once | INTRAVENOUS | Status: AC
  Administered 2017-01-15: 10 mL/h via INTRAVENOUS

## 2017-01-15 MED ORDER — ACETAMINOPHEN 325 MG PO TABS: 650.0000 mg | ORAL_TABLET | Freq: Four times a day (QID) | ORAL | Status: DC | PRN

## 2017-01-15 MED ORDER — PANTOPRAZOLE SODIUM 40 MG IV SOLR
40.0000 mg | Freq: Two times a day (BID) | INTRAVENOUS | Status: DC
  Administered 2017-01-15 (×2): 40 mg via INTRAVENOUS
  Filled 2017-01-15 (×2): qty 40

## 2017-01-15 MED ORDER — SODIUM CHLORIDE 0.9 % IV SOLN
Freq: Once | INTRAVENOUS | Status: AC
  Administered 2017-01-15: 16:00:00 via INTRAVENOUS

## 2017-01-15 MED ORDER — OXYCODONE HCL 5 MG PO TABS
10.0000 mg | ORAL_TABLET | ORAL | Status: DC | PRN
Start: 2017-01-15 — End: 2017-01-16
  Administered 2017-01-15: 10 mg via ORAL
  Filled 2017-01-15: qty 2

## 2017-01-15 MED ORDER — ONDANSETRON HCL 4 MG PO TABS: 4.0000 mg | ORAL_TABLET | Freq: Four times a day (QID) | ORAL | Status: DC | PRN

## 2017-01-15 MED ORDER — NOREPINEPHRINE 4 MG/250ML-% IV SOLN
INTRAVENOUS | Status: AC
  Filled 2017-01-15: qty 250

## 2017-01-15 MED ORDER — FENTANYL 50 MCG/HR TD PT72: 100.0000 ug | MEDICATED_PATCH | TRANSDERMAL | Status: DC

## 2017-01-15 MED ORDER — PROTAMINE SULFATE 10 MG/ML IV SOLN: 50.0000 mg | INTRAVENOUS | Status: DC

## 2017-01-15 MED ORDER — MEGESTROL ACETATE 40 MG/ML PO SUSP
200.0000 mg | Freq: Every day | ORAL | Status: DC
  Filled 2017-01-15 (×2): qty 5

## 2017-01-15 MED ORDER — POLYETHYLENE GLYCOL 3350 17 G PO PACK: 17.0000 g | PACK | Freq: Every day | ORAL | Status: DC | PRN

## 2017-01-15 MED ORDER — LORAZEPAM 1 MG PO TABS: 0.5000 mg | ORAL_TABLET | Freq: Four times a day (QID) | ORAL | Status: DC | PRN

## 2017-01-15 MED ORDER — BACLOFEN 10 MG PO TABS: 20.0000 mg | ORAL_TABLET | Freq: Four times a day (QID) | ORAL | Status: DC | PRN

## 2017-01-15 MED ORDER — ACETAMINOPHEN 650 MG RE SUPP: 650.0000 mg | Freq: Four times a day (QID) | RECTAL | Status: DC | PRN

## 2017-01-15 MED ORDER — PROTAMINE SULFATE 10 MG/ML IV SOLN
25.0000 mg | Freq: Once | INTRAVENOUS | Status: AC
  Administered 2017-01-15: 25 mg via INTRAVENOUS
  Filled 2017-01-15: qty 2.5

## 2017-01-15 MED ORDER — SODIUM CHLORIDE 0.9 % IV BOLUS (SEPSIS)
1000.0000 mL | Freq: Once | INTRAVENOUS | Status: AC
  Administered 2017-01-15: 1000 mL via INTRAVENOUS

## 2017-01-15 MED ORDER — LACTULOSE 10 GM/15ML PO SOLN
10.0000 g | Freq: Two times a day (BID) | ORAL | Status: DC
  Administered 2017-01-15: 10 g via ORAL
  Filled 2017-01-15: qty 30

## 2017-01-15 MED ORDER — GADOBENATE DIMEGLUMINE 529 MG/ML IV SOLN
10.0000 mL | Freq: Once | INTRAVENOUS | Status: AC | PRN
  Administered 2017-01-15: 10 mL via INTRAVENOUS

## 2017-01-15 MED ORDER — ESCITALOPRAM OXALATE 10 MG PO TABS
40.0000 mg | ORAL_TABLET | Freq: Every day | ORAL | Status: DC
  Administered 2017-01-15: 40 mg via ORAL
  Filled 2017-01-15: qty 4

## 2017-01-15 MED ORDER — SODIUM CHLORIDE 0.9 % IV BOLUS (SEPSIS)
500.0000 mL | Freq: Once | INTRAVENOUS | Status: AC
  Administered 2017-01-15: 500 mL via INTRAVENOUS

## 2017-01-15 MED ORDER — ONDANSETRON HCL 4 MG/2ML IJ SOLN
4.0000 mg | Freq: Four times a day (QID) | INTRAMUSCULAR | Status: DC | PRN
Start: 2017-01-15 — End: 2017-01-16
  Administered 2017-01-15: 16:00:00 4 mg via INTRAVENOUS
  Filled 2017-01-15: qty 2

## 2017-01-15 MED ORDER — EPINEPHRINE PF 1 MG/10ML IJ SOSY
PREFILLED_SYRINGE | INTRAMUSCULAR | Status: AC
  Filled 2017-01-15: qty 10

## 2017-01-17 ENCOUNTER — Ambulatory Visit: Payer: 59 | Admitting: Radiation Oncology

## 2017-01-17 LAB — BPAM RBC
BLOOD PRODUCT EXPIRATION DATE: 201805122359
Blood Product Expiration Date: 201805112359
Blood Product Expiration Date: 201805202359
Blood Product Expiration Date: 201805242359
ISSUE DATE / TIME: 201805011550
ISSUE DATE / TIME: 201805011823
UNIT TYPE AND RH: 9500
Unit Type and Rh: 600
Unit Type and Rh: 9500
Unit Type and Rh: 9500

## 2017-01-17 LAB — TYPE AND SCREEN
ABO/RH(D): A NEG
ANTIBODY SCREEN: POSITIVE
Donor AG Type: NEGATIVE
Donor AG Type: NEGATIVE
Unit division: 0
Unit division: 0
Unit division: 0
Unit division: 0

## 2017-01-17 LAB — PREPARE RBC (CROSSMATCH)

## 2017-01-18 ENCOUNTER — Ambulatory Visit: Payer: 59 | Admitting: Hematology and Oncology

## 2017-01-18 ENCOUNTER — Telehealth: Payer: Self-pay | Admitting: Internal Medicine

## 2017-01-18 NOTE — Telephone Encounter (Signed)
Received Death Certificate placed in [place].    Call when ready to pick up [phone]

## 2017-01-18 NOTE — Telephone Encounter (Signed)
Called [employer]mation service to inform them the death cert is ready for pick up and faxed per their request to [phone]. Placed up front for pick up.

## 2017-01-18 NOTE — Telephone Encounter (Signed)
Death cert given to DK to fill out. 

## 2017-01-21 ENCOUNTER — Encounter: Payer: Self-pay | Admitting: Hematology and Oncology

## 2017-01-21 ENCOUNTER — Ambulatory Visit: Payer: 59

## 2017-01-21 DIAGNOSIS — Z7189 Other specified counseling: Secondary | ICD-10-CM | POA: Insufficient documentation

## 2017-01-23 ENCOUNTER — Ambulatory Visit: Payer: 59 | Admitting: Radiation Oncology

## 2017-01-26 ENCOUNTER — Other Ambulatory Visit: Payer: Self-pay | Admitting: Nurse Practitioner

## 2017-02-15 NOTE — Telephone Encounter (Signed)
Called to report that she is having her MRI right now and she would like fo rher to coe over to get lab work today because she is having bloody stools, she has a stool sample and she has held her Lovenox this morning, she wants blood checked also. After discussing with Letitia Libra, RN she was advised to take her to the ER for evaluation. She verbalized understanding.

## 2017-02-15 NOTE — ED Notes (Signed)
Pt has 125 mcg fentanyl patch to left back

## 2017-02-15 NOTE — Death Summary Note (Signed)
DEATH SUMMARY   Patient Details  Name: Sandra Landry MRN: [id number] DOB: [birth year]  Admission/Discharge Information   Admit Date:  02-13-2017  Date of Death: Date of Death: Feb 13, 2017  Time of Death: Time of Death: 01/13/44  Length of Stay: 1  Referring Physician: Lynnell Jude, MD   Reason(s) for Hospitalization  RECTAL BLEEDING  Diagnoses  Preliminary cause of death:  Secondary Diagnoses (including complications and co-morbidities):  Active Problems:   GI bleed   Bright red blood per rectum   Arterial hypotension   Malignant neoplasm of colon (HCC) Acute On chronic Anemia due to GI bleed Urothelial cancer Brief Hospital Course (including significant findings, care, treatment, and services provided and events leading to death)  Sandra Landry was a 66 y.o. year old female who had a history of urothelial cell cancer of the colon.She was noted to have a lower endoscopic ultrasound on 06/21/16 which showed a 5.6 cm large mass in the rectosigmoid colon, biopsies were significant for urothelial cell carcinoma, thought to likely represent a primary retroperitoneal mass rather than a colonic mass. PET scan on 06/06/16 showed hypermetabolism in the right lower quadrant corresponding to the retroperitoneal mass. She was started on chemotherapy in November 2017, she had a left lower extremity ultrasound which showed DVT on 09/15/16. She also has a history of a subarachnoid hemorrhage status post fall in November 2017, subsequently she had an IVC filter placed for the DVT, and was started on Lovenox. She completed palliative radiation to her right pelvis in 2017-01-12.She had continue the Lovenox 60 mg every 12 at home.  She presented to the ED on Feb 14, 2023 was noted that she was having pink blood in her stools. The patient was admitted to the floor, however, subsequently it was noted that she was having a large bloody bowel movement. She has then had at least 2 more large bloody BMs which were bright  red. She was transferred to the ICU for closer observation.  Patient was DNR.  Around 22:30 patient started to breath agonally and became unresponsive.  She lost her pulse and was declared dead at 22:45 pm on Feb 13, 2017.  Her partner was informed and present at the bedside.   Pertinent Labs and Studies  Significant Diagnostic Studies Mr Lumbar Spine W Wo Contrast  Result Date: 2017/02/13 CLINICAL DATA:  Low back pain with right hip and thigh pain for 4 weeks. History of ureteral cancer. EXAM: MRI LUMBAR SPINE WITHOUT AND WITH CONTRAST TECHNIQUE: Multiplanar and multiecho pulse sequences of the lumbar spine were obtained without and with intravenous contrast. CONTRAST:  79mL MULTIHANCE GADOBENATE DIMEGLUMINE 529 MG/ML IV SOLN COMPARISON:  Multiple exams, including bone scan of 12/25/2016 and CT scan of 11/14/2016 FINDINGS: Segmentation: The lowest lumbar type non-rib-bearing vertebra is labeled as L5. Alignment:  3 mm degenerative retrolisthesis at L1- 2 and L2-3. Vertebrae: Tract invasion of the right S1 vertebral body anteriorly by the large right ureteral mass, the intraosseous component of the lesion measures approximately 2.4 by 3.7 by 2.5 cm. The extraosseous component appears to be invading the right psoas muscle and involves the right retroperitoneum. There is a benign appearing 25% superior endplate compression fracture at L2 eccentric to the right, with associated subcortical edema and faint bandlike subcortical enhancement along the compression fracture. This corresponds to a region of accentuated activity at L2 along the superior endplate. There are no characteristics causing me to favor that this is a malignant lesion at the L2 level. No other regions of  abnormal lumbar enhancement are identified. Conus medullaris: Extends to the L1-2 level and appears normal. No visible epidural tumor or clumping/enhancement of nerve roots to suggest arachnoiditis or epidural spread of malignancy. Paraspinal and other  soft tissues: Atrophic right kidney with percutaneous stent noted. As described above there is a large right tumor along the psoas muscle invading the right anterior S1 vertebral body, likely corresponding to the original ureteral cancer. Disc levels: L1-2: Mild impingement on the left L1 nerve roots due to cephalad extension of a disc extrusion in the left lateral recess. L2-3:  No impingement.  Disc bulge with central annular tear. L3-4:  No impingement.  Disc bulge and mild left facet arthropathy. L4-5: No impingement. Mild disc bulge and mild bilateral facet arthropathy. L5-S1:  No impingement.  Left greater than right facet arthropathy. IMPRESSION: 1. 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 2. Percent superior endplate compression fracture of L2 eccentric to the right, with associated subcortical edema but no evidence of underlying malignancy at the L2 level. Likely late subacute compression. 3. There is a left lateral recess disc extrusion extending cephalad from the L1-2 level potentially causing mild impingement on the left L1 nerve roots as they approach the neural foramen. Electronically Signed   By: Van Clines M.D.   On: Jan 31, 2017 11:19   Nm Bone Scan Whole Body  Result Date: 12/25/2016 CLINICAL DATA:  History right ureteral carcinoma with metastasis, some pain in the right groin, hip, leg and knee EXAM: NUCLEAR MEDICINE WHOLE BODY BONE SCAN TECHNIQUE: Whole body anterior and posterior images were obtained approximately 3 hours after intravenous injection of radiopharmaceutical. RADIOPHARMACEUTICALS:  23.6 mCi Technetium-34m MDP IV COMPARISON:  CT chest abdomen and pelvis of 11/14/2016 FINDINGS: There is a mild thoracolumbar scoliosis present. There is minimal activity in the region S1 vertebral body where previously a lytic and blastic lesion has been demonstrated. No other definite focus of  increased activity is seen. IMPRESSION: 1. Mildly increased activity in the region S1 vertebral body where previous lytic and blastic lesion has been identified. 2. No definite additional focus of increased activity is seen. 3. Mild thoracolumbar curvature. Electronically Signed   By: Ivar Drape M.D.   On: 12/25/2016 16:43   Dg Chest Port 1 View  Result Date: 31-Jan-2017 CLINICAL DATA:  Aspiration tonight EXAM: PORTABLE CHEST 1 VIEW COMPARISON:  07/26/2016, 11/14/2016. FINDINGS: Right jugular port remain satisfactorily positioned. The lungs are clear. The pulmonary vasculature is normal. Hilar, mediastinal and cardiac contours are unremarkable and unchanged. IMPRESSION: No acute findings. Electronically Signed   By: Andreas Newport M.D.   On: Jan 31, 2017 22:56    Microbiology Recent Results (from the past 240 hour(s))  MRSA PCR Screening     Status: None   Collection Time: 01/31/2017 11:06 AM  Result Value Ref Range Status   MRSA by PCR NEGATIVE NEGATIVE Final    Comment:        The GeneXpert MRSA Assay (FDA approved for NASAL specimens only), is one component of a comprehensive MRSA colonization surveillance program. It is not intended to diagnose MRSA infection nor to guide or monitor treatment for MRSA infections.     Lab Basic Metabolic Panel:  Recent Labs Lab 01-31-2017 1121  NA 135  K 3.7  CL 105  CO2 22  GLUCOSE 95  BUN 24*  CREATININE 1.45*  CALCIUM 8.7*   Liver Function Tests:  Recent Labs Lab 31-Jan-2017  1121  AST 12*  ALT 6*  ALKPHOS 39  BILITOT 0.4  PROT 6.4*  ALBUMIN 2.7*   No results for input(s): LIPASE, AMYLASE in the last 168 hours. No results for input(s): AMMONIA in the last 168 hours. CBC:  Recent Labs Lab 02/10/2017 1121  WBC 16.1*  HGB 8.1*  HCT 24.8*  MCV 92.2  PLT 351   Cardiac Enzymes: No results for input(s): CKTOTAL, CKMB, CKMBINDEX, TROPONINI in the last 168 hours. Sepsis Labs:  Recent Labs Lab 02-10-17 1121  WBC 16.1*      Angles Trevizo S Saraann Enneking 02/12/2017, 12:02 AM

## 2017-02-15 NOTE — Progress Notes (Signed)
Page my nurse regarding patient acutely having rectal bleed. Had stool mixed with bright red blood. But also significant amount of just blood. Blood pressure is 95/46. Heart rate 99.  Has dose of Lovenox was yesterday evening.  Patient has no abdominal pain. Feels weak. Still has a lot of blood in her bed. And on the floor.  * Acute GI bleed. Unclear if this is lower GI bleed or upper GI bleed. Bolus 1 L normal saline stat. Will get 2 units of blood now. Transferred to ICU. Discussed with Dr. Madalyn Rob of ICU. And also GI Dr. Vicente Males.  Stat nuclear bleeding scan. If this is positive we will need vascular surgery for embolization. Updated patient's partner. Discussed with nursing staff.  Patient is critically ill with high risk of cardiac arrest and death.  Time spent 35 minutes

## 2017-02-15 NOTE — Progress Notes (Signed)
Pt alert and oriented X4 throughout beginning of shift. Night time meds given around 2130, pt very much awake and alert, having conversation with writing RN. Another RN entered room around 2210 and pt was up awake and alert. Around 2225 writing RN noticed monitor saying patients leads were off. Upon entering room RN found pt unresponsive and agonal breathing, diaphoretic. Pt sat up, tried to stimulate, no response. Agricultural consultant and NP called. BP retaken BP of 73/31 (45). Started a second bolus on pt, levo started, BG taken of 93. Pt significant other called. Pt BP remaining low. Around 2245 pulse not able to be obtained. Pronounced by NP at 2245. Significant other at bedside remaining with pt.

## 2017-02-15 NOTE — Consult Note (Addendum)
Jonathon Bellows MD  [address] [place], [place] [address] Phone: [phone] Fax : [phone]  Consultation  Referring Provider:     Dr Darvin Neighbours Primary Care Physician:  Lynnell Jude, MD Primary Gastroenterologist:  Dr. Denice Paradise          Reason for Consultation:     Rectal bleeding   Date of Admission:  2017-02-01 Date of Consultation:  02-01-17         HPI:   Sandra Landry is a 66 y.o. female   A Rectal EUS was performed on 06/2016 and the note mentions a large mass seen 14-18 cm from the anal verge. Biopsy taken on 06/21/17 suggests a urothelial carcinoma. 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 PET scan in 05/2016 showed Rt lower quadrant retroperitoneal mass in close association with the rectosigmoid colon originating outside the colon .   Today she presented to the ER with rectal bleeding . She is on Lovenox for DVT with IVC filter in place . I was called a short while back as she was having profuse rectal bleeding .   In the ICU , she had multiple red bloody bowel movements. She complains of nausea but no hematemesis. BP on the lower side , she is undergoing blood transfusion.    Past Medical History:  Diagnosis Date  . Anxiety   . Arthritis   . Asthma    mild  . Cancer (HCC)    urothelia  . Cervical disc disease   . COPD (chronic obstructive pulmonary disease) (HCC)    mild  . Depression   . Dysrhythmia    tachycardia  . Fibromyalgia   . GERD (gastroesophageal reflux disease)   . Hepatitis C   . History of hiatal hernia   . Intracerebral hemorrhage [facility])     Past Surgical History:  Procedure Laterality Date  . BREAST REDUCTION SURGERY    . cervical discectomy with fusion    . COLONOSCOPY  01/2016  . EUS N/A 06/21/2016   Procedure: LOWER ENDOSCOPIC ULTRASOUND (EUS);  Surgeon: Reita Cliche, MD;  Location: [facility] ENDOSCOPY;  Service: Endoscopy;  Laterality: N/A;  . IR GENERIC HISTORICAL  07/11/2016   IR  NEPHROSTOMY PLACEMENT RIGHT 07/11/2016 Greggory Keen, MD [facility]-INTERV RAD  . IR GENERIC HISTORICAL  08/31/2016   IR NEPHROSTOMY TUBE CHANGE 08/31/2016 [facility]-INTERV RAD  . IR GENERIC HISTORICAL  09/20/2016   IR NEPHROSTOMY TUBE CHANGE 09/20/2016 [facility]INTERV RAD  . PERIPHERAL VASCULAR CATHETERIZATION N/A 07/23/2016   Procedure: Glori Luis Cath Insertion;  Surgeon: Algernon Huxley, MD;  Location: [place] CV LAB;  Service: Cardiovascular;  Laterality: N/A;  . PERIPHERAL VASCULAR CATHETERIZATION N/A 09/18/2016   Procedure: IVC Filter Insertion;  Surgeon: Katha Cabal, MD;  Location: [place] CV LAB;  Service: Cardiovascular;  Laterality: N/A;    Prior to Admission medications   Medication Sig Start Date End Date Taking? Authorizing Provider  acetaminophen (TYLENOL) 500 MG tablet Take 500 mg by mouth every 6 (six) hours as needed.   Yes Historical Provider, MD  baclofen (LIORESAL) 20 MG tablet Take 20 mg by mouth 4 (four) times daily as needed for muscle spasms.  04/09/16  Yes Historical Provider, MD  bisacodyl (DULCOLAX) 5 MG EC tablet Take 5 mg by mouth daily as needed for moderate constipation.   Yes Historical Provider, MD  cyanocobalamin 2000 MCG tablet Take 2,000 mcg by mouth daily.   Yes Historical Provider, MD  diltiazem (CARDIZEM CD) 240  MG 24 hr capsule Take 240 mg by mouth at bedtime.    Yes Historical Provider, MD  diphenhydrAMINE (BENADRYL) 25 MG tablet Take 25-50 mg by mouth every 6 (six) hours as needed for allergies.    Yes Historical Provider, MD  enoxaparin (LOVENOX) 60 MG/0.6ML injection Inject 0.6 mLs (60 mg total) into the skin every 12 (twelve) hours. 11/29/16  Yes Lequita Asal, MD  escitalopram (LEXAPRO) 20 MG tablet Take 40 mg by mouth at bedtime.  04/26/16  Yes Historical Provider, MD  fentaNYL (DURAGESIC - DOSED MCG/HR) 100 MCG/HR Place 1 patch (100 mcg total) onto the skin every 3 (three) days. 01/11/17  Yes Lequita Asal, MD  fentaNYL (DURAGESIC - DOSED MCG/HR) 25  MCG/HR patch Place 1 patch (25 mcg total) onto the skin every 3 (three) days. Add to the 100 mcq patch change every 3 days 12/28/16  Yes Lequita Asal, MD  folic acid (FOLVITE) 413 MCG tablet Take 400 mcg by mouth daily.   Yes Historical Provider, MD  Iron-Vitamin C (VITRON-C PO) Take 1 tablet by mouth daily.   Yes Historical Provider, MD  lactulose (CHRONULAC) 10 GM/15ML solution Take 10 g by mouth 2 (two) times daily.  06/07/16  Yes Historical Provider, MD  lidocaine-prilocaine (EMLA) cream Apply cream to port site 1 hour prior to chemotherapy, place small amount of saran wrap to cover cream and protect clothing. 07/24/16  Yes Lequita Asal, MD  LORazepam (ATIVAN) 0.5 MG tablet TAKE 1 TABLET BY MOUTH EVERY 6 HOURS AS NEEDED 12/19/16  Yes Cammie Sickle, MD  megestrol (MEGACE) 40 MG/ML suspension TAKE 5 MLS (200 MG TOTAL) BY MOUTH DAILY. 12/15/16  Yes Lequita Asal, MD  NEOMYCIN-POLYMYXIN-HYDROCORTISONE (CORTISPORIN) 1 % SOLN otic solution INSTILL 2 DROPS TO AFFECTED EAR 4 TIMES DAILY 08/23/16  Yes Historical Provider, MD  ondansetron (ZOFRAN) 8 MG tablet TAKE 1 TABLET (8 MG TOTAL) BY MOUTH EVERY SIX (6) HOURS AS NEEDED FOR NAUSEA. 08/17/16  Yes Historical Provider, MD  Oxycodone HCl 10 MG TABS Take 1 tablet (10 mg total) by mouth every 2 (two) hours as needed. 01/07/17  Yes Lequita Asal, MD  polyethylene glycol (MIRALAX / GLYCOLAX) packet Take 17 g by mouth daily as needed for mild constipation.  05/07/16  Yes Historical Provider, MD  predniSONE (DELTASONE) 10 MG tablet Take 1 tablet (10 mg total) by mouth daily with breakfast. 12/28/16  Yes Lequita Asal, MD  prochlorperazine (COMPAZINE) 5 MG/ML injection Inject 10 mg into the muscle every 4 (four) hours as needed (nausea).    Yes Historical Provider, MD  promethazine (PHENERGAN) 25 MG suppository Place 25 mg rectally every 6 (six) hours as needed for nausea or vomiting.  02/20/16  Yes Historical Provider, MD  ranitidine (ZANTAC)  300 MG tablet Take 300 mg by mouth 2 (two) times daily.  03/08/15  Yes Historical Provider, MD  oxyCODONE-acetaminophen (PERCOCET) 7.5-325 MG tablet Take 1 tablet by mouth every 4 (four) hours as needed for severe pain. Patient not taking: Reported on 22-Jan-2017 12/26/16   Lequita Asal, MD    Family History  Problem Relation Age of Onset  . Cancer Neg Hx      Social History  Substance Use Topics  . Smoking status: Never Smoker  . Smokeless tobacco: Never Used  . Alcohol use No    Allergies as of 01/22/2017 - Review Complete 2017-01-22  Allergen Reaction Noted  . Other Swelling 03/23/2015  . Gabapentin Other (See Comments)  07/30/2016  . Iodides  11/06/2014  . Tetanus toxoids Other (See Comments) 05/25/2016  . Iodine Other (See Comments) and Rash     Review of Systems:    All systems reviewed and negative except where noted in HPI.   Physical Exam:  Vital signs in last 24 hours: Temp:  [97.8 F (36.6 C)-98.6 F (37 C)] 97.8 F (36.6 C) (05/01 1556) Pulse Rate:  [65-100] 73 (05/01 1556) Resp:  [12-18] 15 (05/01 1556) BP: (83-117)/(46-66) 83/46 (05/01 1556) SpO2:  [97 %-100 %] 100 % (05/01 1556) Weight:  [121 lb (54.9 kg)] 121 lb (54.9 kg) (05/01 1110)   General:  Drowsy , answers questions , feels cold  Head:  Normocephalic and atraumatic. Eyes:   No icterus.   Conjunctiva pink. PERRLA. Ears:  Normal auditory acuity. Neck:  Supple; no masses or thyroidomegaly Lungs: Respirations even and unlabored. Lungs clear to auscultation bilaterally.   No wheezes, crackles, or rhonchi.  Heart:  Regular rate and rhythm;  Without murmur, clicks, rubs or gallops Abdomen:  Soft, nondistended, nontender. Normal bowel sounds. No rebound or guarding.  Rectal:  Not performed. Extremities:  Without edema, cyanosis or clubbing. Neurologic:  Alert and oriented x3;  grossly normal neurologically. Psych:  Alert and cooperative.   LAB RESULTS:  Recent Labs  10-Feb-2017 1121  WBC 16.1*    HGB 8.1*  HCT 24.8*  PLT 351   BMET  Recent Labs  02-10-17 1121  NA 135  K 3.7  CL 105  CO2 22  GLUCOSE 95  BUN 24*  CREATININE 1.45*  CALCIUM 8.7*   LFT  Recent Labs  2017-02-10 1121  PROT 6.4*  ALBUMIN 2.7*  AST 12*  ALT 6*  ALKPHOS 39  BILITOT 0.4   PT/INR  Recent Labs  02-10-2017 1121  LABPROT 12.8  INR 0.96    STUDIES: Mr Lumbar Spine W Wo Contrast  Result Date: 2017-02-10 CLINICAL DATA:  Low back pain with right hip and thigh pain for 4 weeks. History of ureteral cancer. EXAM: MRI LUMBAR SPINE WITHOUT AND WITH CONTRAST TECHNIQUE: Multiplanar and multiecho pulse sequences of the lumbar spine were obtained without and with intravenous contrast. CONTRAST:  43mL MULTIHANCE GADOBENATE DIMEGLUMINE 529 MG/ML IV SOLN COMPARISON:  Multiple exams, including bone scan of 12/25/2016 and CT scan of 11/14/2016 FINDINGS: Segmentation: The lowest lumbar type non-rib-bearing vertebra is labeled as L5. Alignment:  3 mm degenerative retrolisthesis at L1- 2 and L2-3. Vertebrae: Tract invasion of the right S1 vertebral body anteriorly by the large right ureteral mass, the intraosseous component of the lesion measures approximately 2.4 by 3.7 by 2.5 cm. The extraosseous component appears to be invading the right psoas muscle and involves the right retroperitoneum. There is a benign appearing 25% superior endplate compression fracture at L2 eccentric to the right, with associated subcortical edema and faint bandlike subcortical enhancement along the compression fracture. This corresponds to a region of accentuated activity at L2 along the superior endplate. There are no characteristics causing me to favor that this is a malignant lesion at the L2 level. No other regions of abnormal lumbar enhancement are identified. Conus medullaris: Extends to the L1-2 level and appears normal. No visible epidural tumor or clumping/enhancement of nerve roots to suggest arachnoiditis or epidural spread of  malignancy. Paraspinal and other soft tissues: Atrophic right kidney with percutaneous stent noted. As described above there is a large right tumor along the psoas muscle invading the right anterior S1 vertebral body, likely corresponding to the  original ureteral cancer. Disc levels: L1-2: Mild impingement on the left L1 nerve roots due to cephalad extension of a disc extrusion in the left lateral recess. L2-3:  No impingement.  Disc bulge with central annular tear. L3-4:  No impingement.  Disc bulge and mild left facet arthropathy. L4-5: No impingement. Mild disc bulge and mild bilateral facet arthropathy. L5-S1:  No impingement.  Left greater than right facet arthropathy. IMPRESSION: 1. 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 2. Percent superior endplate compression fracture of L2 eccentric to the right, with associated subcortical edema but no evidence of underlying malignancy at the L2 level. Likely late subacute compression. 3. There is a left lateral recess disc extrusion extending cephalad from the L1-2 level potentially causing mild impingement on the left L1 nerve roots as they approach the neural foramen. Electronically Signed   By: Van Clines M.D.   On: 02-12-17 11:19      Impression / Plan:   Sandra Landry is a 66 y.o. y/o female with metastatic ureteral cancer presents to the Er today with rectal bleeding . She is known to have an infiltrative mass per lower  endoscopic ultrasound in 06/2016 which is 14-18 cm from anal verge. Presently she is having profuse severe  rectal bleeding which is very likely from the mass .   Plan : 1. Monitor cbc and transfuse as needed 2. She is in the process of undergoing a tagged RBC scan and if shows active bleed from the colon  will need vascular intervention. If from an upper GI source , her blood pressure would need to be more stable prior to any  endoscopy for her to tolerate anesthesia. I did briefly ask her about her thoughts about any endoscopy and she said she wasn't sure at this time.   Thank you for involving me in the care of this patient.      LOS: 0 days   Jonathon Bellows, MD  02-12-2017, 4:06 PM

## 2017-02-15 NOTE — ED Notes (Signed)
Meal tray ordered 

## 2017-02-15 NOTE — Consult Note (Signed)
[facility] Consultation      Assessment and Plan:  The patient is a 66 year old female with a history of urothelial cell cancer of the colon, metastatic, on lovenox for DVT, now presents with a large right red bloody bowel movement and low blood pressure.  Rectosigmoid urothelial cell cancer, metastatic. -On chemotherapy. -This is the likely source of the patient's bleeding, along with the presence of Lovenox.  Acute lower GI bleeding with low blood pressure, blood loss anemia. -Secondary to above, a she is on Lovenox because of a history of DVT. -Check stat PTT, patient may benefit from treatment with protamine. -Discussed with gastroenterology, patient is not a candidate for colonoscopy at this time due to the known cancer, and its location. Colonoscopy is unlikely to be of benefit in controlling the bleeding. -Continue to transfuse as needed. -Have discussed with the patient, she does not want any aggressive measures and prefers conservative measures to try to to control the bleeding. Her CODE STATUS is noted to be DO NOT RESUSCITATE.  History of DVT. -Patient was on Lovenox, which is now being held. -History of IVC filter, history of intracranial hemorrhage, status post fall in November 2017.  -Confirmed DNR status with patient.    Date: Jan 23, 2017  MRN# [id number] Sandra Landry 10/08/1950  Referring Physician:   Falicity Sheets is a 66 y.o. old female seen in consultation for chief complaint of:    Chief Complaint  Patient presents with  . Rectal Bleeding    HPI:   Patient is a 66 year old female with urothelial cell carcinoma, perirectal mass. She is noted to have a lower endoscopic ultrasound on 06/21/16 which showed a 5.6 cm large mass in the rectosigmoid colon, biopsies were significant for urothelial cell carcinoma, thought to likely represent a primary retroperitoneal mass rather than a colonic mass. PET scan on 06/06/16 showed hypermetabolism in the  right lower quadrant corresponding to the retroperitoneal mass. She was started on chemotherapy in November 2017, she had a left lower extremity ultrasound which showed DVT on 09/15/16. She also has a history of a subarachnoid hemorrhage status post fall in November 2017, subsequently she had an IVC filter placed for the DVT, and was started on Lovenox. She completed palliative radiation to her right pelvis in March 2018. She had continue the Lovenox 60 mg every 12 at home.  Currently, the patient has little in the way of complaints, she is very tired and weak, her main complaint is that she is cold at this time. She presented to the ED this morning, after was noted that she was having pink blood in her stools. The patient was admitted to the floor, however, subsequently it was noted that she was having a large bloody bowel movement. She has then had at least 2 more large bloody BMs which were bright red.  Her systolic blood pressures have run in the mid to high 80s.  Images personally reviewed, review CT chest, that shows a small left lower lobe nodule. Otherwise unremarkable.  PMHX:   Past Medical History:  Diagnosis Date  . Anxiety   . Arthritis   . Asthma    mild  . Cancer (HCC)    urothelia  . Cervical disc disease   . COPD (chronic obstructive pulmonary disease) (HCC)    mild  . Depression   . Dysrhythmia    tachycardia  . Fibromyalgia   . GERD (gastroesophageal reflux disease)   . Hepatitis C   . History of hiatal hernia   .  Intracerebral hemorrhage [facility])    Surgical Hx:  Past Surgical History:  Procedure Laterality Date  . BREAST REDUCTION SURGERY    . cervical discectomy with fusion    . COLONOSCOPY  01/2016  . EUS N/A 06/21/2016   Procedure: LOWER ENDOSCOPIC ULTRASOUND (EUS);  Surgeon: Reita Cliche, MD;  [facility];  Service: Endoscopy;  Laterality: N/A;  . IR GENERIC HISTORICAL  07/11/2016   IR NEPHROSTOMY PLACEMENT RIGHT 07/11/2016 Greggory Keen, MD  [facility]-INTERV RAD  . IR GENERIC HISTORICAL  08/31/2016   IR NEPHROSTOMY TUBE CHANGE 08/31/2016 [facility]-INTERV RAD  . IR GENERIC HISTORICAL  09/20/2016   IR NEPHROSTOMY TUBE CHANGE 09/20/2016 [facility]INTER[facility]  . PERIPHERAL VASCULAR CATHETERIZATION N/A 07/23/2016   Procedure: Glori Luis Cath Insertion;  Surgeon: Algernon Huxley, MD;  Location: [place] CV LAB;  Service: Cardiovascular;  Laterality: N/A;  . PERIPHERAL VASCULAR CATHETERIZATION N/A 09/18/2016   Procedure: IVC Filter Insertion;  Surgeon: Katha Cabal, MD;  Location: [place] CV LAB;  Service: Cardiovascular;  Laterality: N/A;   Family Hx:  Family History  Problem Relation Age of Onset  . Cancer Neg Hx    Social Hx:   Social History  Substance Use Topics  . Smoking status: Never Smoker  . Smokeless tobacco: Never Used  . Alcohol use No   Medication:   Reviewed   Allergies:  Other; Gabapentin; Iodides; Tetanus toxoids; and Iodine  Review of Systems: Gen:  Denies  fever, sweats, chills HEENT: Denies blurred vision, double vision. bleeds, sore throat Cvc:  No dizziness, chest pain. Resp:   Denies cough or sputum production, shortness of breath Gi: Denies swallowing difficulty, stomach pain. Gu:  Denies bladder incontinence, burning urine Ext:   No Joint pain, stiffness. Skin: No skin rash,  hives  Endoc:  No polyuria, polydipsia. Psych: No depression, insomnia. Other:  All other systems were reviewed with the patient and were negative other that what is mentioned in the HPI.   Physical Examination:   VS: BP (!) 87/28   Pulse 75   Temp 97.7 F (36.5 C) (Oral)   Resp 15   Ht 5\' 6"  (1.676 m)   Wt 121 lb (54.9 kg)   SpO2 99%   BMI 19.53 kg/m   General Appearance: No distress  Neuro:without focal findings,  speech normal,  HEENT: PERRLA, EOM intact.   Pulmonary: normal breath sounds, No wheezing.  CardiovascularNormal S1,S2.  No m/r/g.   Abdomen: Benign, Soft, non-tender.; large, bright red bloody BM noted.    Renal:  No costovertebral tenderness  GU:  No performed at this time. Endoc: No evident thyromegaly, no signs of acromegaly. Skin:   warm, no rashes, no ecchymosis  Extremities: normal, no cyanosis, clubbing.  Other findings:    LABORATORY PANEL:   CBC  Recent Labs Lab 01/21/2017 1121  WBC 16.1*  HGB 8.1*  HCT 24.8*  PLT 351   ------------------------------------------------------------------------------------------------------------------  Chemistries   Recent Labs Lab 2017-01-21 1121  NA 135  K 3.7  CL 105  CO2 22  GLUCOSE 95  BUN 24*  CREATININE 1.45*  CALCIUM 8.7*  AST 12*  ALT 6*  ALKPHOS 39  BILITOT 0.4   ------------------------------------------------------------------------------------------------------------------  Cardiac Enzymes No results for input(s): TROPONINI in the last 168 hours. ------------------------------------------------------------  RADIOLOGY:  Mr Lumbar Spine W Wo Contrast  Result Date: 01-21-17 CLINICAL DATA:  Low back pain with right hip and thigh pain for 4 weeks. History of ureteral cancer. EXAM: MRI LUMBAR SPINE WITHOUT AND  WITH CONTRAST TECHNIQUE: Multiplanar and multiecho pulse sequences of the lumbar spine were obtained without and with intravenous contrast. CONTRAST:  70mL MULTIHANCE GADOBENATE DIMEGLUMINE 529 MG/ML IV SOLN COMPARISON:  Multiple exams, including bone scan of 12/25/2016 and CT scan of 11/14/2016 FINDINGS: Segmentation: The lowest lumbar type non-rib-bearing vertebra is labeled as L5. Alignment:  3 mm degenerative retrolisthesis at L1- 2 and L2-3. Vertebrae: Tract invasion of the right S1 vertebral body anteriorly by the large right ureteral mass, the intraosseous component of the lesion measures approximately 2.4 by 3.7 by 2.5 cm. The extraosseous component appears to be invading the right psoas muscle and involves the right retroperitoneum. There is a benign appearing 25% superior endplate compression fracture at L2  eccentric to the right, with associated subcortical edema and faint bandlike subcortical enhancement along the compression fracture. This corresponds to a region of accentuated activity at L2 along the superior endplate. There are no characteristics causing me to favor that this is a malignant lesion at the L2 level. No other regions of abnormal lumbar enhancement are identified. Conus medullaris: Extends to the L1-2 level and appears normal. No visible epidural tumor or clumping/enhancement of nerve roots to suggest arachnoiditis or epidural spread of malignancy. Paraspinal and other soft tissues: Atrophic right kidney with percutaneous stent noted. As described above there is a large right tumor along the psoas muscle invading the right anterior S1 vertebral body, likely corresponding to the original ureteral cancer. Disc levels: L1-2: Mild impingement on the left L1 nerve roots due to cephalad extension of a disc extrusion in the left lateral recess. L2-3:  No impingement.  Disc bulge with central annular tear. L3-4:  No impingement.  Disc bulge and mild left facet arthropathy. L4-5: No impingement. Mild disc bulge and mild bilateral facet arthropathy. L5-S1:  No impingement.  Left greater than right facet arthropathy. IMPRESSION: 1. 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 2. Percent superior endplate compression fracture of L2 eccentric to the right, with associated subcortical edema but no evidence of underlying malignancy at the L2 level. Likely late subacute compression. 3. There is a left lateral recess disc extrusion extending cephalad from the L1-2 level potentially causing mild impingement on the left L1 nerve roots as they approach the neural foramen. Electronically Signed   By: Van Clines M.D.   On: 01-29-17 11:19       Thank  you for the consultation and for allowing [facility] to assist in the care of your patient. Our recommendations are noted above.  Please contact us if we can be of further service.   Marda Stalker, MD.  Board Certified in Internal Medicine, Pulmonary Medicine, [place], and Sleep Medicine.  [facility] Office Number: [phone]  Patricia Pesa, M.D.  Merton Border, M.D  Jan 29, 2017

## 2017-02-15 NOTE — Progress Notes (Addendum)
Pt brought from the ED to Room 128 via stretcher by ED personnel; called into pt's room via telephone call by NT to come to the room now; upon entering pt's room pt found lying in the pt's bathroom floor with large amount of red gelatinous stool present; NT verbalized that the pt was given privacy in the bathroom in order for her to go to the bathroom, need for urine specimen, hat in place; NT checked on the pt due to length of time in the bathroom, pt lying on the floor, pt verbalized that she got up and layed herself down in the floor, she did not feel good; floor staff able to get pt out of the floor and into her bed; Dr Darvin Neighbours contacted, present in room, verbal orders given, read back to MD; pt transferred to ICU 2; due to emergency treatment unable to initiate Navigator/Admission nor start any other assessment

## 2017-02-15 NOTE — H&P (Signed)
[facility] NAME: Sandra Landry    MR#:  [id number]  DATE OF BIRTH:  1951-05-26  DATE OF ADMISSION:  02-03-17  PRIMARY CARE PHYSICIAN: Lynnell Jude, MD   REQUESTING/REFERRING PHYSICIAN: Dr. Mariea Clonts  CHIEF COMPLAINT:   Chief Complaint  Patient presents with  . Rectal Bleeding    HISTORY OF PRESENT ILLNESS:  Sandra Landry  is a 66 y.o. female with a known history of Urothelial cancer, chronic anemia, chronic pain syndrome presents to the emergency room with rectal bleeding. Patient was at the hospital but mainly to get an MRI of the hip due to right lower extremity pain from her cancer. Also had rectal bleeding and feeling extremely weak and presented to the emergency room. Here her hemoglobin is 8.1 down from 9.5. One unit blood ordered in the ER. Patient is on Lovenox for a left lower extremity DVT with an IVC filter in place. Feels weak. No palpitations or dizziness. No vomiting. She does have dark stools chronically due to iron pills. She has had some black stools over the last 2-3 days according to her partner. No blood in urine.  PAST MEDICAL HISTORY:   Past Medical History:  Diagnosis Date  . Anxiety   . Arthritis   . Asthma    mild  . Cancer (HCC)    urothelia  . Cervical disc disease   . COPD (chronic obstructive pulmonary disease) (HCC)    mild  . Depression   . Dysrhythmia    tachycardia  . Fibromyalgia   . GERD (gastroesophageal reflux disease)   . Hepatitis C   . History of hiatal hernia   . Intracerebral hemorrhage ([place])     PAST SURGICAL HISTORY:   Past Surgical History:  Procedure Laterality Date  . BREAST REDUCTION SURGERY    . cervical discectomy with fusion    . COLONOSCOPY  01/2016  . EUS N/A 06/21/2016   Procedure: LOWER ENDOSCOPIC ULTRASOUND (EUS);  Surgeon: Reita Cliche, MD;  Location: [facility] ENDOSCOPY;  Service: Endoscopy;  Laterality: N/A;  . IR GENERIC HISTORICAL  07/11/2016   IR  NEPHROSTOMY PLACEMENT RIGHT 07/11/2016 Greggory Keen, MD [facility]INTER[facility] RAD  . IR GENERIC HISTORICAL  08/31/2016   IR NEPHROSTOMY TUBE CHANGE 08/31/2016 [facility]INTER[facility]  . IR GENERIC HISTORICAL  09/20/2016   IR NEPHROSTOMY TUBE CHANGE 09/20/2016 [facility]  . PERIPHERAL VASCULAR CATHETERIZATION N/A 07/23/2016   Procedure: Glori Luis Cath Insertion;  Surgeon: Algernon Huxley, MD;  Location: [facility];  Service: Cardiovascular;  Laterality: N/A;  . PERIPHERAL VASCULAR CATHETERIZATION N/A 09/18/2016   Procedure: IVC Filter Insertion;  Surgeon: Katha Cabal, MD;  Location: [facility];  Service: Cardiovascular;  Laterality: N/A;    SOCIAL HISTORY:   Social History  Substance Use Topics  . Smoking status: Never Smoker  . Smokeless tobacco: Never Used  . Alcohol use No    FAMILY HISTORY:   Family History  Problem Relation Age of Onset  . Cancer Neg Hx     DRUG ALLERGIES:   Allergies  Allergen Reactions  . Other Swelling    Dust, ragweed  . Gabapentin Other (See Comments)    Jerking motions and confusion.  . Iodides   . Tetanus Toxoids Other (See Comments)    Localized pain and swelling  . Iodine Other (See Comments) and Rash    Limited history. Occurred in 60s, at age 43-12, received contrast at public health facility in  [place], while being evaluated for meningitis. Immediate rash - not itchy, "flat, red, perfect circles" over entire body. Has not received iodine contrast since.    REVIEW OF SYSTEMS:   Review of Systems  Constitutional: Positive for malaise/fatigue and weight loss. Negative for chills and fever.  HENT: Negative for hearing loss and nosebleeds.   Eyes: Negative for blurred vision, double vision and pain.  Respiratory: Negative for cough, hemoptysis, sputum production, shortness of breath and wheezing.   Cardiovascular: Negative for chest pain, palpitations, orthopnea and leg swelling.  Gastrointestinal: Positive for blood in stool and  constipation. Negative for abdominal pain, diarrhea, nausea and vomiting.  Genitourinary: Positive for dysuria. Negative for hematuria.  Musculoskeletal: Negative for back pain, falls and myalgias.  Skin: Negative for rash.  Neurological: Positive for weakness. Negative for dizziness, tremors, sensory change, speech change, focal weakness, seizures and headaches.  Endo/Heme/Allergies: Does not bruise/bleed easily.  Psychiatric/Behavioral: Positive for depression. Negative for memory loss. The patient is not nervous/anxious.     MEDICATIONS AT HOME:   Prior to Admission medications   Medication Sig Start Date End Date Taking? Authorizing Provider  acetaminophen (TYLENOL) 500 MG tablet Take 500 mg by mouth every 6 (six) hours as needed.   Yes Historical Provider, MD  baclofen (LIORESAL) 20 MG tablet Take 20 mg by mouth 4 (four) times daily as needed for muscle spasms.  04/09/16  Yes Historical Provider, MD  bisacodyl (DULCOLAX) 5 MG EC tablet Take 5 mg by mouth daily as needed for moderate constipation.   Yes Historical Provider, MD  cyanocobalamin 2000 MCG tablet Take 2,000 mcg by mouth daily.   Yes Historical Provider, MD  diltiazem (CARDIZEM CD) 240 MG 24 hr capsule Take 240 mg by mouth at bedtime.    Yes Historical Provider, MD  diphenhydrAMINE (BENADRYL) 25 MG tablet Take 25-50 mg by mouth every 6 (six) hours as needed for allergies.    Yes Historical Provider, MD  enoxaparin (LOVENOX) 60 MG/0.6ML injection Inject 0.6 mLs (60 mg total) into the skin every 12 (twelve) hours. 11/29/16  Yes Lequita Asal, MD  escitalopram (LEXAPRO) 20 MG tablet Take 40 mg by mouth at bedtime.  04/26/16  Yes Historical Provider, MD  fentaNYL (DURAGESIC - DOSED MCG/HR) 100 MCG/HR Place 1 patch (100 mcg total) onto the skin every 3 (three) days. 01/11/17  Yes Lequita Asal, MD  fentaNYL (DURAGESIC - DOSED MCG/HR) 25 MCG/HR patch Place 1 patch (25 mcg total) onto the skin every 3 (three) days. Add to the 100  mcq patch change every 3 days 12/28/16  Yes Lequita Asal, MD  folic acid (FOLVITE) 400 MCG tablet Take 400 mcg by mouth daily.   Yes Historical Provider, MD  Iron-Vitamin C (VITRON-C PO) Take 1 tablet by mouth daily.   Yes Historical Provider, MD  lactulose (CHRONULAC) 10 GM/15ML solution Take 10 g by mouth 2 (two) times daily.  06/07/16  Yes Historical Provider, MD  lidocaine-prilocaine (EMLA) cream Apply cream to port site 1 hour prior to chemotherapy, place small amount of saran wrap to cover cream and protect clothing. 07/24/16  Yes Lequita Asal, MD  LORazepam (ATIVAN) 0.5 MG tablet TAKE 1 TABLET BY MOUTH EVERY 6 HOURS AS NEEDED 12/19/16  Yes Cammie Sickle, MD  megestrol (MEGACE) 40 MG/ML suspension TAKE 5 MLS (200 MG TOTAL) BY MOUTH DAILY. 12/15/16  Yes Lequita Asal, MD  NEOMYCIN-POLYMYXIN-HYDROCORTISONE (CORTISPORIN) 1 % SOLN otic solution INSTILL 2 DROPS TO AFFECTED EAR 4  TIMES DAILY 08/23/16  Yes Historical Provider, MD  ondansetron (ZOFRAN) 8 MG tablet TAKE 1 TABLET (8 MG TOTAL) BY MOUTH EVERY SIX (6) HOURS AS NEEDED FOR NAUSEA. 08/17/16  Yes Historical Provider, MD  Oxycodone HCl 10 MG TABS Take 1 tablet (10 mg total) by mouth every 2 (two) hours as needed. 01/07/17  Yes Lequita Asal, MD  polyethylene glycol (MIRALAX / GLYCOLAX) packet Take 17 g by mouth daily as needed for mild constipation.  05/07/16  Yes Historical Provider, MD  predniSONE (DELTASONE) 10 MG tablet Take 1 tablet (10 mg total) by mouth daily with breakfast. 12/28/16  Yes Lequita Asal, MD  prochlorperazine (COMPAZINE) 5 MG/ML injection Inject 10 mg into the muscle every 4 (four) hours as needed (nausea).    Yes Historical Provider, MD  promethazine (PHENERGAN) 25 MG suppository Place 25 mg rectally every 6 (six) hours as needed for nausea or vomiting.  02/20/16  Yes Historical Provider, MD  ranitidine (ZANTAC) 300 MG tablet Take 300 mg by mouth 2 (two) times daily.  03/08/15  Yes Historical Provider, MD   oxyCODONE-acetaminophen (PERCOCET) 7.5-325 MG tablet Take 1 tablet by mouth every 4 (four) hours as needed for severe pain. Patient not taking: Reported on Jan 25, 2017 12/26/16   Lequita Asal, MD     VITAL SIGNS:  Blood pressure 117/63, pulse 65, temperature 98.6 F (37 C), temperature source Oral, resp. rate 15, height 5\' 6"  (1.676 m), weight 54.9 kg (121 lb), SpO2 99 %.  PHYSICAL EXAMINATION:  Physical Exam  GENERAL:  66 y.o.-year-old patient lying in the bed with no acute distress.  EYES: Pupils equal, round, reactive to light and accommodation. No scleral icterus. Extraocular muscles intact.  HEENT: Head atraumatic, normocephalic. Oropharynx and nasopharynx clear. No oropharyngeal erythema, moist oral mucosa  NECK:  Supple, no jugular venous distention. No thyroid enlargement, no tenderness.  LUNGS: Normal breath sounds bilaterally, no wheezing, rales, rhonchi. No use of accessory muscles of respiration.  CARDIOVASCULAR: S1, S2 normal. No murmurs, rubs, or gallops.  ABDOMEN: Soft, nontender, nondistended. Bowel sounds present. No organomegaly or mass.  EXTREMITIES: No pedal edema, cyanosis, or clubbing. + 2 pedal & radial pulses b/l.   NEUROLOGIC: Cranial nerves II through XII are intact. No focal Motor or sensory deficits appreciated b/l PSYCHIATRIC: The patient is alert and oriented x 3. Good affect.  SKIN: No obvious rash, lesion, or ulcer.   LABORATORY PANEL:   CBC  Recent Labs Lab 2017/01/25 1121  WBC 16.1*  HGB 8.1*  HCT 24.8*  PLT 351   ------------------------------------------------------------------------------------------------------------------  Chemistries   Recent Labs Lab 01-25-2017 1121  NA 135  K 3.7  CL 105  CO2 22  GLUCOSE 95  BUN 24*  CREATININE 1.45*  CALCIUM 8.7*  AST 12*  ALT 6*  ALKPHOS 39  BILITOT 0.4   ------------------------------------------------------------------------------------------------------------------  Cardiac  Enzymes No results for input(s): TROPONINI in the last 168 hours. ------------------------------------------------------------------------------------------------------------------  RADIOLOGY:  Mr Lumbar Spine W Wo Contrast  Result Date: 01/25/17 CLINICAL DATA:  Low back pain with right hip and thigh pain for 4 weeks. History of ureteral cancer. EXAM: MRI LUMBAR SPINE WITHOUT AND WITH CONTRAST TECHNIQUE: Multiplanar and multiecho pulse sequences of the lumbar spine were obtained without and with intravenous contrast. CONTRAST:  60mL MULTIHANCE GADOBENATE DIMEGLUMINE 529 MG/ML IV SOLN COMPARISON:  Multiple exams, including bone scan of 12/25/2016 and CT scan of 11/14/2016 FINDINGS: Segmentation: The lowest lumbar type non-rib-bearing vertebra is labeled as L5. Alignment:  3 mm  degenerative retrolisthesis at L1- 2 and L2-3. Vertebrae: Tract invasion of the right S1 vertebral body anteriorly by the large right ureteral mass, the intraosseous component of the lesion measures approximately 2.4 by 3.7 by 2.5 cm. The extraosseous component appears to be invading the right psoas muscle and involves the right retroperitoneum. There is a benign appearing 25% superior endplate compression fracture at L2 eccentric to the right, with associated subcortical edema and faint bandlike subcortical enhancement along the compression fracture. This corresponds to a region of accentuated activity at L2 along the superior endplate. There are no characteristics causing me to favor that this is a malignant lesion at the L2 level. No other regions of abnormal lumbar enhancement are identified. Conus medullaris: Extends to the L1-2 level and appears normal. No visible epidural tumor or clumping/enhancement of nerve roots to suggest arachnoiditis or epidural spread of malignancy. Paraspinal and other soft tissues: Atrophic right kidney with percutaneous stent noted. As described above there is a large right tumor along the psoas muscle  invading the right anterior S1 vertebral body, likely corresponding to the original ureteral cancer. Disc levels: L1-2: Mild impingement on the left L1 nerve roots due to cephalad extension of a disc extrusion in the left lateral recess. L2-3:  No impingement.  Disc bulge with central annular tear. L3-4:  No impingement.  Disc bulge and mild left facet arthropathy. L4-5: No impingement. Mild disc bulge and mild bilateral facet arthropathy. L5-S1:  No impingement.  Left greater than right facet arthropathy. IMPRESSION: 1. 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 2. Percent superior endplate compression fracture of L2 eccentric to the right, with associated subcortical edema but no evidence of underlying malignancy at the L2 level. Likely late subacute compression. 3. There is a left lateral recess disc extrusion extending cephalad from the L1-2 level potentially causing mild impingement on the left L1 nerve roots as they approach the neural foramen. Electronically Signed   By: Van Clines M.D.   On: 2017/02/07 11:19     IMPRESSION AND PLAN:   * Rectal bleeding His son. This is upper or lower GI bleed. But hemodynamically stable. Hemoglobin has trended down from 9.5-8.1. Patient is Lovenox for DVT in her left lower extremity in January 2018. Does have an IVC filter in place. Lovenox held. Clear liquid diet. Consult GI. 1 unit packed RBC ordered in the emergency room. We'll check hemoglobin after the transfusion. We will need to assess if patient will benefit from EGD or colonoscopy as at this point they have decided not to do any chemotherapy and concentrate on quality of life. But are open to procedures which can benefit the patient after that discussing with GI physician.  * Acute on chronic anemia due to GI bleed.  * Urothelial cancer. Getting radiation for pain. No chemotherapy. Follows with Dr.  Mike Gip at the cancer center.  * Chronic pain syndrome. Continue fentanyl and oral pain medications.  * DVT prophylaxis with SCDs  All the records are reviewed and case discussed with ED provider. Management plans discussed with the patient, family and they are in agreement.  CODE STATUS: DNR  TOTAL TIME TAKING CARE OF THIS PATIENT: 40 minutes.   Hillary Bow R M.D on 2017-02-07 at 1:32 PM  Between 7am to 6pm - Pager - [phone]  After 6pm go to [email or web address] - password [email or web address] [facility]  Office  [phone]  CC: Primary  care physician; Lynnell Jude, MD  Note: This dictation was prepared with Dragon dictation along with smaller phrase technology. Any transcriptional errors that result from this process are unintentional.

## 2017-02-15 NOTE — ED Triage Notes (Signed)
Pt states some blood in her stool this AM, states lower abd and back pain and right thigh pain, pt on lovenox, partner states the blood was "visible and pink", pt has hx of cancer and had an MRI today for eval of back pain and right leg pain, awake and alert
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
## 2017-02-15 DEATH — deceased

## 2018-02-14 IMAGING — CT CT ABD-PELV W/O CM
2 of 7 series · 14 of 46 positions shown, 18 images · non-contrast
Comparison: None.

CLINICAL DATA: Right lower quadrant pain for 2 years, worse in the
last month. Nausea and vomiting.

EXAM:
CT ABDOMEN AND PELVIS WITHOUT CONTRAST
TECHNIQUE: Multidetector CT imaging of the abdomen and pelvis was performed
following the standard protocol without IV contrast.

[Series 2: abd pel wo · axial · 0.67mm/px · z∈[-966,-566]mm · 11 of 92 slices shown, 15 images]
[im 6/92  soft-tissue]
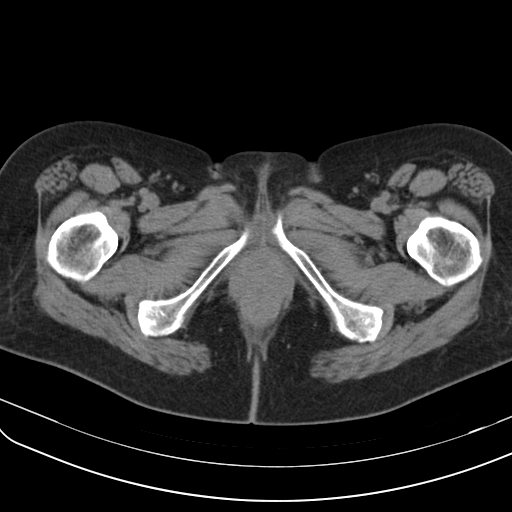
[im 6/92  bone]
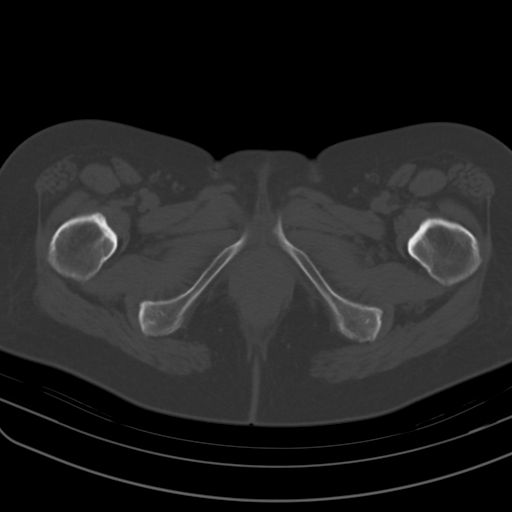
[im 18/92  soft-tissue]
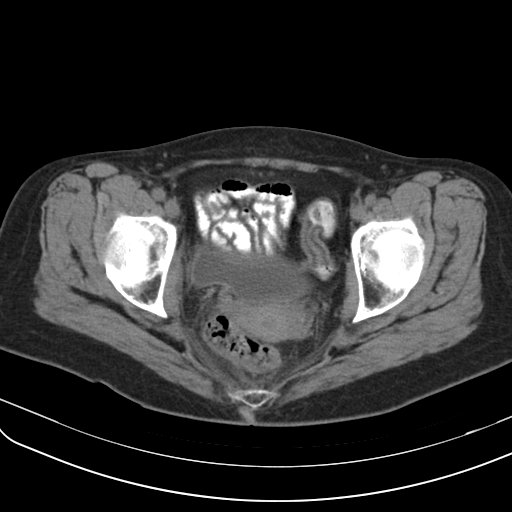
[im 29/92  soft-tissue]
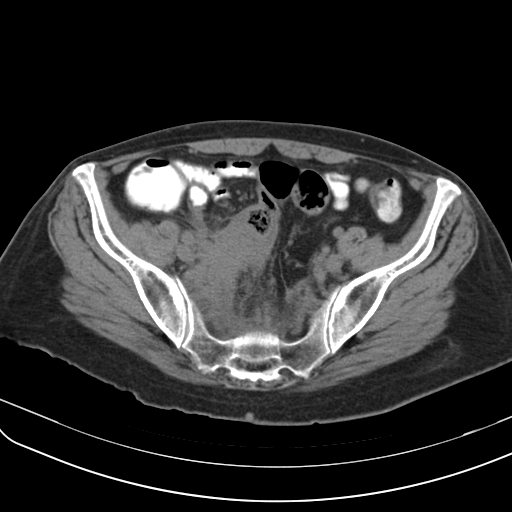
[im 35/92  soft-tissue]
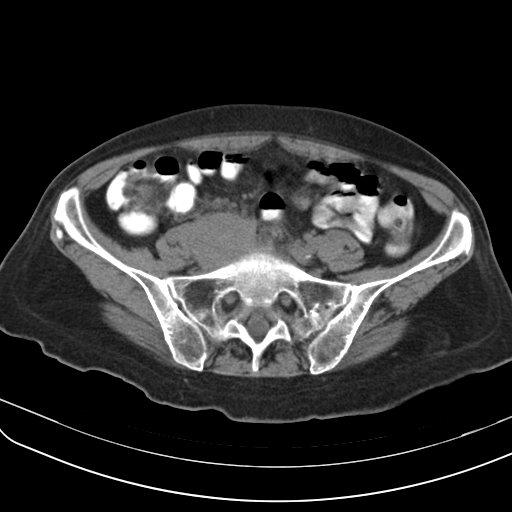
[im 46/92  soft-tissue]
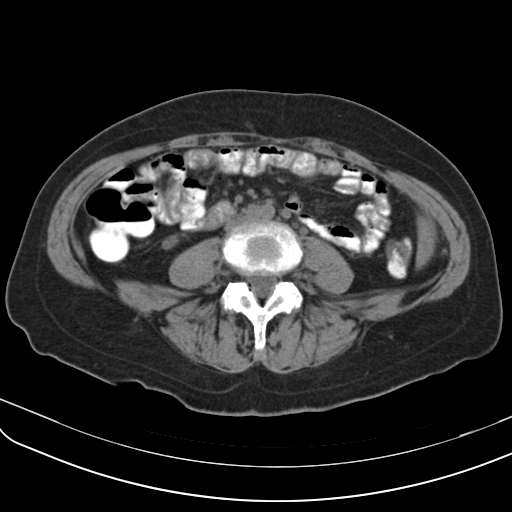
[im 57/92  soft-tissue]
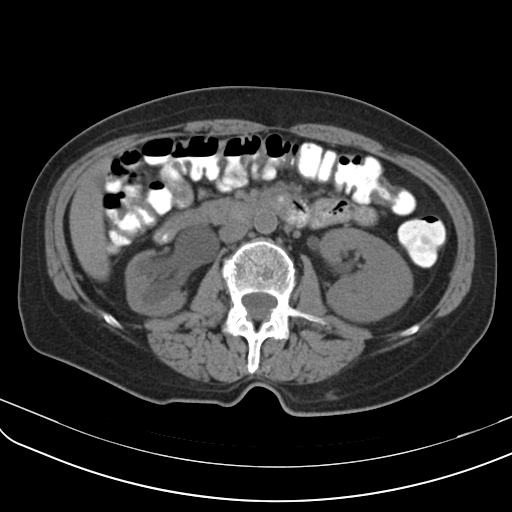
[im 63/92  soft-tissue]
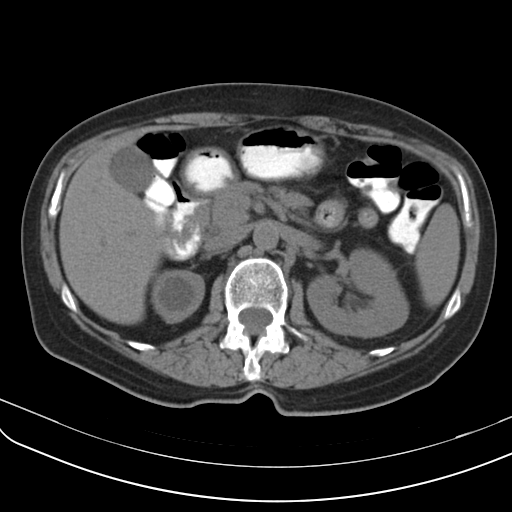
[im 69/92  lung]
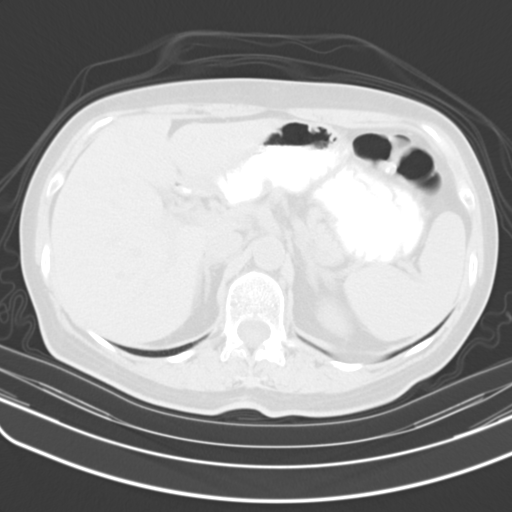
[im 74/92  soft-tissue]
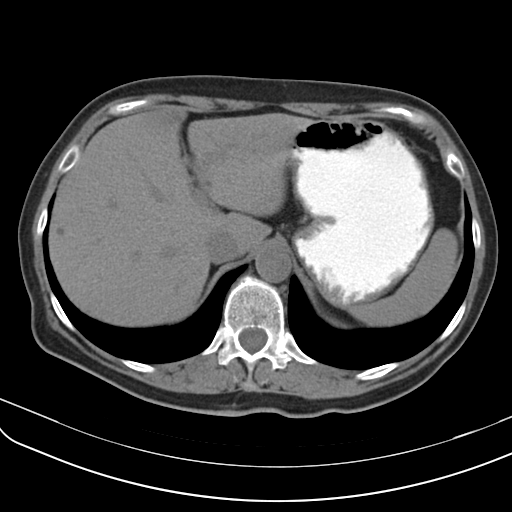
[im 74/92  lung]
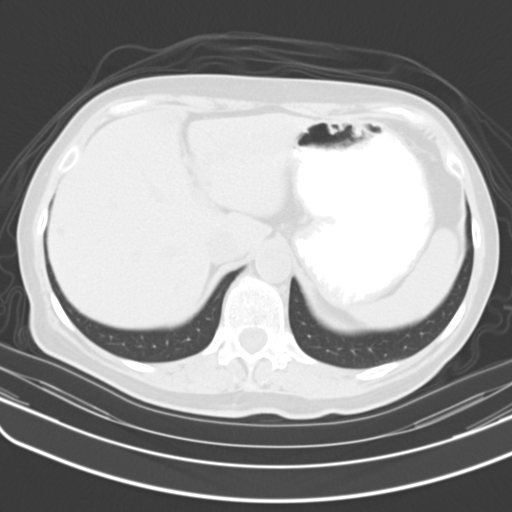
[im 80/92  lung]
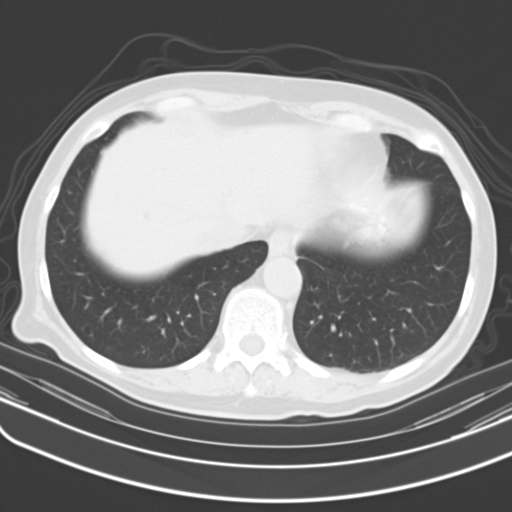
[im 86/92  soft-tissue]
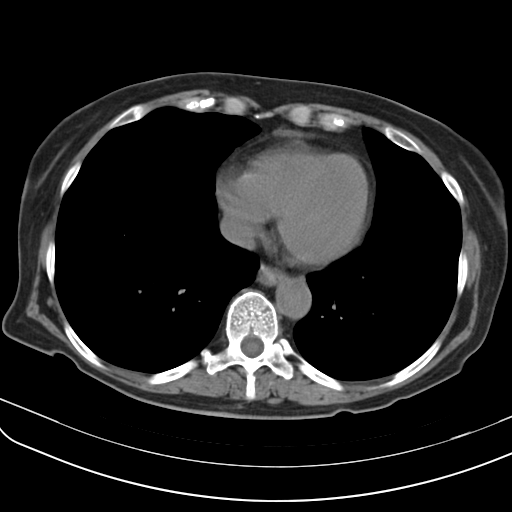
[im 86/92  lung]
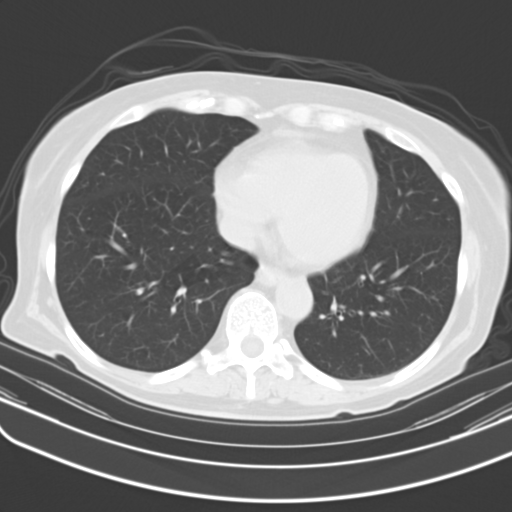
[im 86/92  bone]
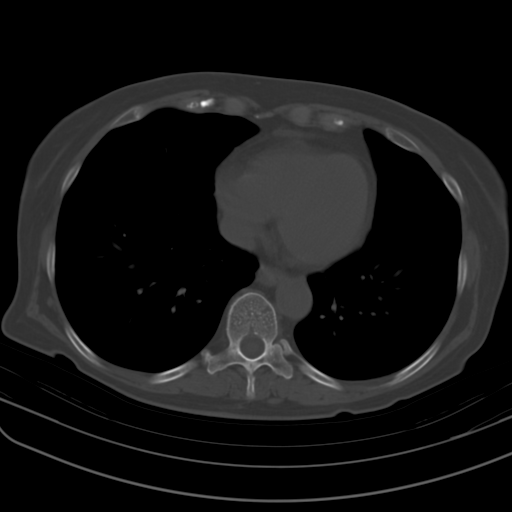

[Series 602: coronal · coronal · 0.91mm/px · 3 of 74 slices shown]
[im 19/74  soft-tissue]
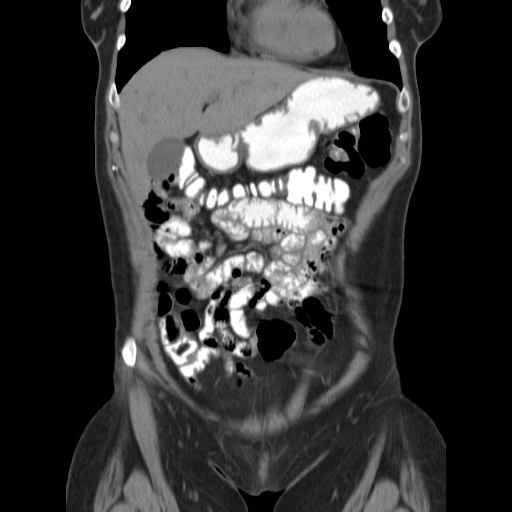
[im 37/74  soft-tissue]
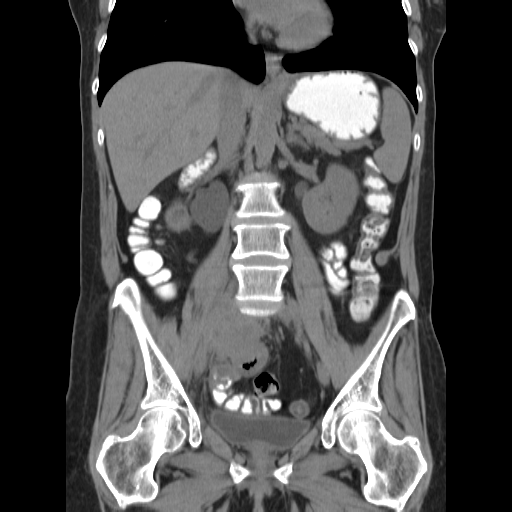
[im 55/74  soft-tissue]
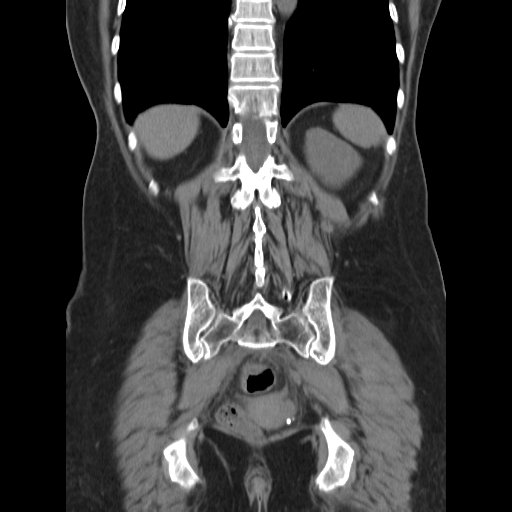

[14 of 46 positions shown; findings below may reference images not displayed]

FINDINGS: There is minimal dependent atelectasis in the left lower lobe. No
pleural effusion.

Multiple subcentimeter hypodensities are present in the liver, too
small to fully characterize and inadequately evaluated without IV
contrast. A small calcification is noted in the right hepatic lobe.
There is no biliary dilatation. The gallbladder, spleen, adrenal
glands, left kidney, and pancreas have an unremarkable unenhanced
appearance.

There is marked right renal cortical atrophy with moderate to severe
hydronephrosis. The right ureter is dilated to the level of the
upper pelvis where there is a 5.7 x 5.5 cm soft tissue mass which
encases the ureter. This mass appears to involve the adjacent
sigmoid colon, may involve or displace the adjacent right psoas
muscle, and encases the right iliac vessels.

Oral contrast is present in nondilated loops of bowel to the level
of the proximal sigmoid colon without evidence of obstruction. The
appendix is unremarkable.

The uterus and left ovary are unremarkable. The right ovary is not
clearly identified, however the right lower quadrant mass is
centered superior to the right adnexa. No free fluid or enlarged
lymph nodes are identified. No suspicious lytic or blastic osseous
lesions are identified.
IMPRESSION: 1. 5.7 cm right lower quadrant retroperitoneal mass highly
concerning for malignancy. The mass involves the sigmoid colon but
is favored to reflect a primary retroperitoneal tumor (such as
sarcoma) over a primary colonic mass.
2. Chronic right ureteral obstruction by the mass with
hydronephrosis and renal atrophy.
3. Multiple subcentimeter liver lesions, indeterminate. These could
be further evaluated with contrast-enhanced abdominal MRI (or CT).
These results were called by telephone at the time of interpretation
on 05/14/2016 at [DATE] to Dr. TANU OXENDINE , who verbally
acknowledged these results.

## 2018-04-13 IMAGING — US US INTRAOPERATIVE - NO REPORT
1 series · 1 of 1 positions shown · non-contrast
Comparison: 06/14/2016

INDICATION: Obstructive right hydronephrosis secondary to right hemipelvis
transition all invasive carcinoma occluding the ureter.

EXAM:
IR NEPHROSTOMY PLACEMENT RIGHT; ULTRAOUND INTRAOPERATIVE - NRPT [facility]

[Series 1: us intraoperative - no report · 0.15mm/px · 1 of 1 slices shown]
[im 1/1]
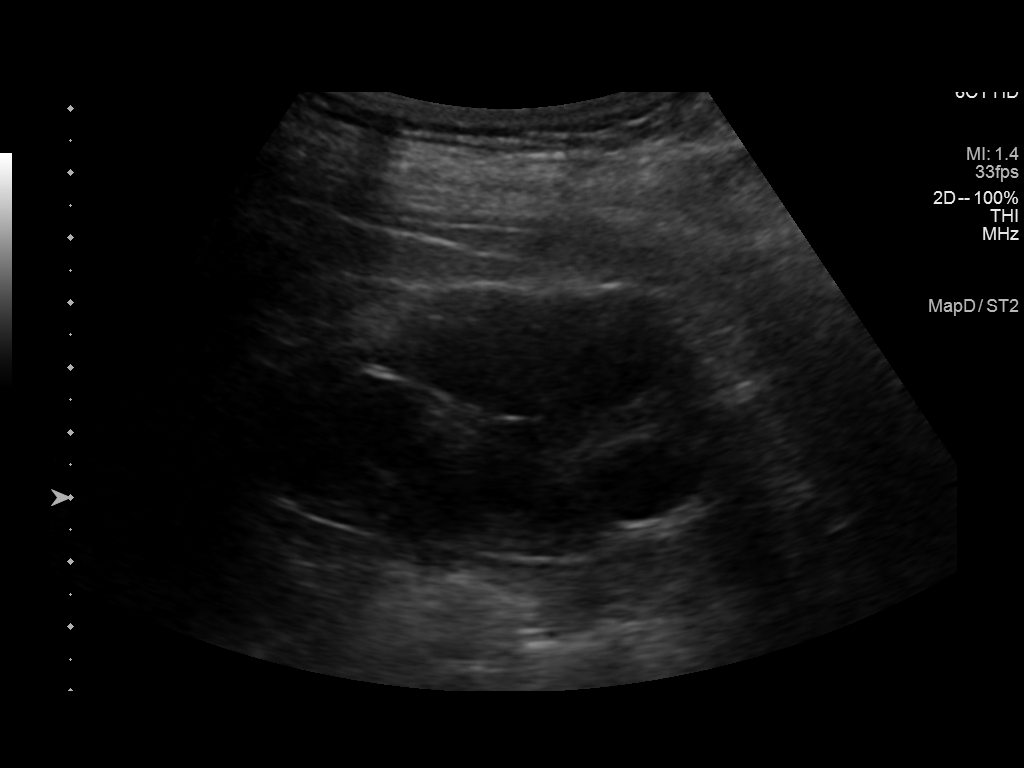

[1 of 1 positions shown; findings below may reference images not displayed]

MEDICATIONS:
Ciprofloxacin 400 mg IV; The antibiotic was administered in an
appropriate time frame prior to skin puncture.

ANESTHESIA/SEDATION:
Fentanyl 50 mcg IV; Versed 1 mg IV

Moderate Sedation Time:  11 minutes

The patient was continuously monitored during the procedure by the
interventional radiology nurse under my direct supervision.

CONTRAST:  10mL OMNIPAQUE IOHEXOL 300 MG/ML SOLN - administered into
the collecting system(s)

FLUOROSCOPY TIME:  Fluoroscopy Time:  42 seconds (2 mGy).

COMPLICATIONS:
None immediate.

PROCEDURE:
Informed written consent was obtained from the patient after a
thorough discussion of the procedural risks, benefits and
alternatives. All questions were addressed. Maximal Sterile Barrier
Technique was utilized including caps, mask, sterile gowns, sterile
gloves, sterile drape, hand hygiene and skin antiseptic. A timeout
was performed prior to the initiation of the procedure.

Previous imaging reviewed. Patient positioned prone. Preliminary
ultrasound performed to localize the hydronephrotic right kidney.

Under sterile conditions and local anesthesia, ultrasound needle
access performed of the hydronephrotic right kidney. There was
return of urine. Images obtained for documentation. Guidewire
advanced centrally into the right kidney. Accustick dilator set
advanced. Amplatz guidewire exchange performed. Tract dilatation
performed to insert a 10 French drain. Retention loop formed in the
renal pelvis. Contrast injection confirms position and images
obtained for documentation. Catheter secured with a Prolene suture
and connected to external gravity drainage bag. No immediate
complication. Patient tolerated the procedure well.
IMPRESSION: Successful ultrasound and fluoroscopic 10 French right nephrostomy
insertion.
